# Patient Record
Sex: Female | Born: 1970 | Marital: Single | Smoking: Never smoker
Health system: Southern US, Community
[De-identification: ages and names within clinical notes are randomized; demographics above are authoritative.]

## PROBLEM LIST (undated history)

## (undated) ENCOUNTER — Inpatient Hospital Stay (HOSPITAL_COMMUNITY): Payer: 59

## (undated) DIAGNOSIS — F329 Major depressive disorder, single episode, unspecified: Secondary | ICD-10-CM

## (undated) DIAGNOSIS — IMO0002 Reserved for concepts with insufficient information to code with codable children: Secondary | ICD-10-CM

## (undated) DIAGNOSIS — E785 Hyperlipidemia, unspecified: Secondary | ICD-10-CM

## (undated) DIAGNOSIS — G4730 Sleep apnea, unspecified: Secondary | ICD-10-CM

## (undated) DIAGNOSIS — C679 Malignant neoplasm of bladder, unspecified: Secondary | ICD-10-CM

## (undated) DIAGNOSIS — B373 Candidiasis of vulva and vagina: Secondary | ICD-10-CM

## (undated) DIAGNOSIS — D649 Anemia, unspecified: Secondary | ICD-10-CM

## (undated) DIAGNOSIS — I499 Cardiac arrhythmia, unspecified: Secondary | ICD-10-CM

## (undated) DIAGNOSIS — Z8679 Personal history of other diseases of the circulatory system: Secondary | ICD-10-CM

## (undated) DIAGNOSIS — I341 Nonrheumatic mitral (valve) prolapse: Secondary | ICD-10-CM

## (undated) DIAGNOSIS — F32A Depression, unspecified: Secondary | ICD-10-CM

## (undated) DIAGNOSIS — N926 Irregular menstruation, unspecified: Secondary | ICD-10-CM

## (undated) DIAGNOSIS — C801 Malignant (primary) neoplasm, unspecified: Secondary | ICD-10-CM

## (undated) DIAGNOSIS — F419 Anxiety disorder, unspecified: Secondary | ICD-10-CM

## (undated) DIAGNOSIS — E611 Iron deficiency: Secondary | ICD-10-CM

## (undated) DIAGNOSIS — R002 Palpitations: Secondary | ICD-10-CM

## (undated) DIAGNOSIS — Z8619 Personal history of other infectious and parasitic diseases: Secondary | ICD-10-CM

## (undated) DIAGNOSIS — R011 Cardiac murmur, unspecified: Secondary | ICD-10-CM

## (undated) HISTORY — PX: LEEP: SHX91

## (undated) HISTORY — DX: Candidiasis of vulva and vagina: B37.3

## (undated) HISTORY — DX: Reserved for concepts with insufficient information to code with codable children: IMO0002

## (undated) HISTORY — DX: Sleep apnea, unspecified: G47.30

## (undated) HISTORY — DX: Personal history of other diseases of the circulatory system: Z86.79

## (undated) HISTORY — DX: Personal history of other infectious and parasitic diseases: Z86.19

## (undated) HISTORY — PX: CERVICAL BIOPSY  W/ LOOP ELECTRODE EXCISION: SUR135

## (undated) HISTORY — DX: Hyperlipidemia, unspecified: E78.5

## (undated) HISTORY — DX: Anxiety disorder, unspecified: F41.9

## (undated) HISTORY — DX: Cardiac murmur, unspecified: R01.1

## (undated) HISTORY — DX: Iron deficiency: E61.1

## (undated) HISTORY — DX: Anemia, unspecified: D64.9

## (undated) HISTORY — DX: Irregular menstruation, unspecified: N92.6

## (undated) HISTORY — DX: Depression, unspecified: F32.A

## (undated) HISTORY — DX: Major depressive disorder, single episode, unspecified: F32.9

## (undated) HISTORY — PX: COLPOSCOPY: SHX161

## (undated) HISTORY — PX: CYSTOSCOPY: SUR368

## (undated) HISTORY — DX: Cardiac arrhythmia, unspecified: I49.9

## (undated) HISTORY — DX: Palpitations: R00.2

---
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
## 1989-04-23 HISTORY — PX: WISDOM TOOTH EXTRACTION: SHX21

## 1994-09-24 DIAGNOSIS — R7611 Nonspecific reaction to tuberculin skin test without active tuberculosis: Secondary | ICD-10-CM | POA: Insufficient documentation

## 2005-03-12 ENCOUNTER — Other Ambulatory Visit: Admission: RE | Admit: 2005-03-12 | Discharge: 2005-03-12 | Payer: Self-pay | Admitting: Gynecology

## 2005-09-11 ENCOUNTER — Ambulatory Visit: Payer: Self-pay | Admitting: Sports Medicine

## 2005-10-16 ENCOUNTER — Ambulatory Visit: Payer: Self-pay | Admitting: Sports Medicine

## 2005-12-14 ENCOUNTER — Ambulatory Visit: Payer: Self-pay | Admitting: Sports Medicine

## 2006-04-23 DIAGNOSIS — C801 Malignant (primary) neoplasm, unspecified: Secondary | ICD-10-CM

## 2006-04-23 HISTORY — PX: BLADDER SURGERY: SHX569

## 2006-04-23 HISTORY — DX: Malignant (primary) neoplasm, unspecified: C80.1

## 2006-11-04 ENCOUNTER — Encounter: Admission: RE | Admit: 2006-11-04 | Discharge: 2006-11-04 | Payer: Self-pay | Admitting: Family Medicine

## 2007-02-04 ENCOUNTER — Ambulatory Visit: Payer: Self-pay | Admitting: Sports Medicine

## 2007-02-04 DIAGNOSIS — M7710 Lateral epicondylitis, unspecified elbow: Secondary | ICD-10-CM | POA: Insufficient documentation

## 2007-02-04 DIAGNOSIS — M76899 Other specified enthesopathies of unspecified lower limb, excluding foot: Secondary | ICD-10-CM | POA: Insufficient documentation

## 2007-06-25 ENCOUNTER — Encounter: Admission: RE | Admit: 2007-06-25 | Discharge: 2007-06-25 | Payer: Self-pay | Admitting: Family Medicine

## 2007-11-04 ENCOUNTER — Ambulatory Visit: Payer: Self-pay | Admitting: Sports Medicine

## 2007-11-04 DIAGNOSIS — M79609 Pain in unspecified limb: Secondary | ICD-10-CM | POA: Insufficient documentation

## 2007-11-04 DIAGNOSIS — M25539 Pain in unspecified wrist: Secondary | ICD-10-CM | POA: Insufficient documentation

## 2007-11-04 DIAGNOSIS — M25519 Pain in unspecified shoulder: Secondary | ICD-10-CM | POA: Insufficient documentation

## 2008-04-06 DIAGNOSIS — B373 Candidiasis of vulva and vagina: Secondary | ICD-10-CM

## 2008-04-06 HISTORY — DX: Candidiasis of vulva and vagina: B37.3

## 2008-07-16 ENCOUNTER — Encounter: Admission: RE | Admit: 2008-07-16 | Discharge: 2008-07-16 | Payer: Self-pay | Admitting: Family Medicine

## 2009-01-10 ENCOUNTER — Encounter: Payer: Self-pay | Admitting: Sports Medicine

## 2009-06-23 ENCOUNTER — Encounter: Payer: Self-pay | Admitting: Sports Medicine

## 2009-07-12 ENCOUNTER — Ambulatory Visit: Payer: Self-pay | Admitting: Sports Medicine

## 2009-07-14 DIAGNOSIS — IMO0002 Reserved for concepts with insufficient information to code with codable children: Secondary | ICD-10-CM

## 2009-07-14 HISTORY — DX: Reserved for concepts with insufficient information to code with codable children: IMO0002

## 2009-07-22 ENCOUNTER — Encounter: Admission: RE | Admit: 2009-07-22 | Discharge: 2009-07-22 | Payer: Self-pay | Admitting: Family Medicine

## 2009-07-28 ENCOUNTER — Encounter: Admission: RE | Admit: 2009-07-28 | Discharge: 2009-07-28 | Payer: Self-pay | Admitting: Family Medicine

## 2009-11-10 ENCOUNTER — Encounter: Payer: Self-pay | Admitting: Sports Medicine

## 2010-05-14 ENCOUNTER — Encounter: Payer: Self-pay | Admitting: Family Medicine

## 2010-05-23 NOTE — Miscellaneous (Signed)
Summary: [facility]   Imported By: Marily Memos 11/15/2009 10:03:20  _____________________________________________________________________  External Attachment:    Type:   Image     Comment:   External Document

## 2010-05-23 NOTE — Miscellaneous (Signed)
Summary: [facility]  [facility]   Imported By: Marily Memos 06/27/2009 10:09:30  _____________________________________________________________________  External Attachment:    Type:   Image     Comment:   External Document

## 2010-05-23 NOTE — Consult Note (Signed)
Summary: [facility]  [facility]   Imported By: Marily Memos 07/12/2009 11:07:32  _____________________________________________________________________  External Attachment:    Type:   Image     Comment:   External Document

## 2010-05-23 NOTE — Assessment & Plan Note (Signed)
Summary: WRIST F/U,[facility]   Vital Signs:  Patient profile:   40 year old female Height:      61 inches Weight:      155 pounds BMI:     29.39 BP sitting:   161 / 91  Vitals Entered By: Enid Baas MD (July 12, 2009 8:49 AM)  History of Present Illness: 2 months ago RT wrist pain had some pain on computer specifically after tennis had to stop with lat wrist pain hurt to move saw Dr Silas [place] day splinted and sent to therapy [facility] in [place] also given exercises for shoulder  now feels much better comes for eval and to see if OK to play tennis   Allergies: No Known Drug Allergies  Physical Exam  General:  Well-developed,well-nourished,in no acute distress; alert,appropriate and cooperative throughout examination Msk:  RT wrist shows full ROM testing in extension, flexdion wnl radial and ulnar deviation are pain free to resistance she does get some clicking with ulnar deviaiton grip OK  review of XRays sho neg ulnar variance otherwise WNL   Impression & Recommendations:  Problem # 1:  WRIST PAIN, RIGHT (ICD-719.43) I think this was probably a strain/ possible partial tear of  TFCC on RT  plan to ease back into tennis given specific rehab exercies and a lot of tennis specific exercises progress over 1 mo if OK can rtc as needed if not rechk  Patient Instructions: 1)  first 2 weeks use your wrist brace 2)  then can try without 3)  exercises with light weight - 1 lb 4)  wrist flexion 5)  wrist extension 6)  up and down motion 7)  wrist rolls - forearm roll 8)  do 15 reps of each of these 9)  then do 30 of each tennis stroke holding the 1 lb weight 10)  once daily do these 11)  consistent for first 6 weeks then you can go to 2 to 3 x per week

## 2010-05-23 NOTE — Miscellaneous (Signed)
Summary: [facility]   Imported By: Marily Memos 06/23/2009 11:07:10  _____________________________________________________________________  External Attachment:    Type:   Image     Comment:   External Document

## 2010-07-04 ENCOUNTER — Other Ambulatory Visit: Payer: Self-pay | Admitting: Obstetrics and Gynecology

## 2010-07-04 DIAGNOSIS — Z1231 Encounter for screening mammogram for malignant neoplasm of breast: Secondary | ICD-10-CM

## 2010-07-27 DIAGNOSIS — N926 Irregular menstruation, unspecified: Secondary | ICD-10-CM

## 2010-07-27 HISTORY — DX: Irregular menstruation, unspecified: N92.6

## 2010-07-28 ENCOUNTER — Ambulatory Visit
Admission: RE | Admit: 2010-07-28 | Discharge: 2010-07-28 | Disposition: A | Discharge status: Home / Self Care | Payer: 59 | Source: Ambulatory Visit | Attending: Obstetrics and Gynecology | Admitting: Obstetrics and Gynecology

## 2010-07-28 DIAGNOSIS — Z1231 Encounter for screening mammogram for malignant neoplasm of breast: Secondary | ICD-10-CM

## 2010-11-08 ENCOUNTER — Encounter (HOSPITAL_COMMUNITY): Payer: Self-pay | Admitting: *Deleted

## 2010-11-21 NOTE — H&P (Signed)
NAME:  Susan Cordova, Susan Cordova                      ACCOUNT NO.:  MEDICAL RECORD NO.:  LOCATION:                                 FACILITY:  PHYSICIAN:  Birdena Kingma A. Dodie Parisi, M.D.      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  HISTORY:  The patient is a 40 year old nulliparous female who presented to me for routine exam and repeat Pap smear.  In March 2011, the patient had Pap smear that was significant for ASCUS with high-risk HPV.  She had a couple of biopsy and a biopsy was negative.  She came in for repeat Pap smear in 6 months which was significant for low-grade squamous intraepithelial lesions, had a biopsy at 6 o'clock which was significant for high-grade intraepithelial lesion and moderate dysplasia, and also had endometrial biopsy for one episode of heavy bleeding which showed benign secretory endometrium without atypia, hyperplasia, or malignancy.  The patient was then counseled as far as observation, LEEP versus conization versus cryo.  The patient shown to proceed with a LEEP procedure.  PAST MEDICAL HISTORY:  Significant for bladder cancer.  The patient has no known drug allergies.  MEDICATIONS:  Include calcium and vitamin D.  FAMILY HISTORY:  Significant for mother with breast cancer at age 93.  PAST GYN HISTORY:  As above.  The patient had one episode where she bled through her clothes and changed her pad every 1-2 hours, this only had been with one cycle.  Her endometrial biopsy was found to be negative.  REVIEW OF SYSTEMS:  GENITOURINARY:  As above.  CARDIOVASCULAR: Significant for heart murmur.  EXTREMITIES:  No muscle weakness.  PHYSICAL EXAMINATION:  VITAL SIGNS:  Blood pressure is 100/60.  She weighs 162 pounds.  She is 5 feet 2 inches, BMI of 29. HEENT:  Her pupils are equal.  Her hearing is normal.   Her throat is clear.  Her thyroid is not enlarged. HEART:  Regular rate and rhythm. CHEST:  Clear to auscultation bilaterally. SKIN AND BREASTS:  No masses, discharge, skin change or nipple retraction. BACK:  Has no CVA tenderness. ABDOMEN:  Nontender without any mass or organomegaly. MUSCULOSKELETAL:  No cyanosis, clubbing or edema. NEUROLOGIC:  Within normal limits. PELVIC:  Full vaginal exam within normal limits.  Cervix is nontender without any lesions.  Uterus, felt slightly irregular, but normal shape, size and nontender.  Adnexa was nontender.  ASSESSMENT:  High-grade intraepithelial lesion and moderate dysplasia.  PLAN:  LEEP.  The patient understands the risks to be but not limited to bleeding, infection, damage to the cervix which could lead to complications in pregnancy with cervical incompetence or stenosis, should she pursue that.  The patient also had ultrasound.  Her uterus and ovaries were normal.  The plan is to proceed with a LEEP.     Susan Cordova A. Normand Sloop, M.D.     NAD/MEDQ  D:  11/21/2010  T:  11/21/2010  Job:  [id number]

## 2010-11-24 ENCOUNTER — Other Ambulatory Visit: Payer: Self-pay | Admitting: Obstetrics and Gynecology

## 2010-11-24 ENCOUNTER — Encounter (HOSPITAL_COMMUNITY)
Admission: RE | Disposition: A | Discharge status: Home / Self Care | Payer: Self-pay | Source: Ambulatory Visit | Attending: Obstetrics and Gynecology

## 2010-11-24 ENCOUNTER — Ambulatory Visit (HOSPITAL_COMMUNITY)
Admission: RE | Admit: 2010-11-24 | Discharge: 2010-11-24 | Disposition: A | Discharge status: Home / Self Care | Payer: 59 | Source: Ambulatory Visit | Attending: Obstetrics and Gynecology | Admitting: Obstetrics and Gynecology

## 2010-11-24 ENCOUNTER — Encounter (HOSPITAL_COMMUNITY): Payer: Self-pay | Admitting: Anesthesiology

## 2010-11-24 ENCOUNTER — Ambulatory Visit (HOSPITAL_COMMUNITY): Payer: 59 | Admitting: Anesthesiology

## 2010-11-24 DIAGNOSIS — N871 Moderate cervical dysplasia: Secondary | ICD-10-CM | POA: Insufficient documentation

## 2010-11-24 HISTORY — PX: LEEP: SHX91

## 2010-11-24 HISTORY — DX: Malignant (primary) neoplasm, unspecified: C80.1

## 2010-11-24 LAB — CBC
HCT: 38.5 % (ref 36.0–46.0)
MCH: 29 pg (ref 26.0–34.0)
MCHC: 33.2 g/dL (ref 30.0–36.0)
Platelets: 184 10*3/uL (ref 150–400)
RBC: 4.41 MIL/uL (ref 3.87–5.11)
RDW: 13.5 % (ref 11.5–15.5)
WBC: 7.3 10*3/uL (ref 4.0–10.5)

## 2010-11-24 LAB — PREGNANCY, URINE: Preg Test, Ur: NEGATIVE

## 2010-11-24 SURGERY — LEEP (LOOP ELECTROSURGICAL EXCISION PROCEDURE)
Anesthesia: Monitor Anesthesia Care | Wound class: Clean Contaminated

## 2010-11-24 MED ORDER — SODIUM CHLORIDE 0.9 % IV SOLN
2.0000 g | Freq: Once | INTRAVENOUS | Status: AC
  Administered 2010-11-24: 2 g via INTRAVENOUS
  Filled 2010-11-24: qty 2000

## 2010-11-24 MED ORDER — HYDROCODONE-ACETAMINOPHEN 5-325 MG PO TABS
ORAL_TABLET | ORAL | Status: AC
  Administered 2010-11-24: 1
  Filled 2010-11-24: qty 1

## 2010-11-24 MED ORDER — ACETAMINOPHEN 325 MG PO TABS: 325.0000 mg | ORAL_TABLET | ORAL | Status: DC | PRN

## 2010-11-24 MED ORDER — MIDAZOLAM HCL 5 MG/5ML IJ SOLN
INTRAMUSCULAR | Status: DC | PRN
  Administered 2010-11-24: 2 mg via INTRAVENOUS

## 2010-11-24 MED ORDER — PROPOFOL 10 MG/ML IV EMUL
INTRAVENOUS | Status: AC
  Filled 2010-11-24: qty 20

## 2010-11-24 MED ORDER — LIDOCAINE HCL (CARDIAC) 20 MG/ML IV SOLN
INTRAVENOUS | Status: DC | PRN
  Administered 2010-11-24 (×2): 30 mg via INTRAVENOUS

## 2010-11-24 MED ORDER — FENTANYL CITRATE 0.05 MG/ML IJ SOLN
INTRAMUSCULAR | Status: DC | PRN
  Administered 2010-11-24: 50 ug via INTRAVENOUS

## 2010-11-24 MED ORDER — LIDOCAINE HCL (CARDIAC) 20 MG/ML IV SOLN
INTRAVENOUS | Status: AC
  Filled 2010-11-24: qty 5

## 2010-11-24 MED ORDER — HYDROCODONE-ACETAMINOPHEN 5-500 MG PO TABS: 1.0000 | ORAL_TABLET | Freq: Four times a day (QID) | ORAL | Status: AC | PRN

## 2010-11-24 MED ORDER — FAMOTIDINE 20 MG PO TABS: 20.0000 mg | ORAL_TABLET | Freq: Once | ORAL | Status: DC | PRN

## 2010-11-24 MED ORDER — ONDANSETRON HCL 4 MG/2ML IJ SOLN
INTRAMUSCULAR | Status: AC
  Filled 2010-11-24: qty 2

## 2010-11-24 MED ORDER — PROPOFOL 10 MG/ML IV EMUL
INTRAVENOUS | Status: DC | PRN
  Administered 2010-11-24: 20 mg via INTRAVENOUS
  Administered 2010-11-24 (×5): 40 mg via INTRAVENOUS
  Administered 2010-11-24: 20 mg via INTRAVENOUS
  Administered 2010-11-24: 10 mg via INTRAVENOUS

## 2010-11-24 MED ORDER — LIDOCAINE HCL 2 % IJ SOLN
INTRAMUSCULAR | Status: DC | PRN
  Administered 2010-11-24: 20 mL via INTRADERMAL

## 2010-11-24 MED ORDER — KETOROLAC TROMETHAMINE 30 MG/ML IJ SOLN
INTRAMUSCULAR | Status: AC
  Filled 2010-11-24: qty 1

## 2010-11-24 MED ORDER — CITRIC ACID-SODIUM CITRATE 334-500 MG/5ML PO SOLN: 30.0000 mL | Freq: Once | ORAL | Status: DC | PRN

## 2010-11-24 MED ORDER — FENTANYL CITRATE 0.05 MG/ML IJ SOLN: 25.0000 ug | INTRAMUSCULAR | Status: DC | PRN

## 2010-11-24 MED ORDER — IBUPROFEN 200 MG PO TABS: 200.0000 mg | ORAL_TABLET | Freq: Four times a day (QID) | ORAL | Status: DC | PRN

## 2010-11-24 MED ORDER — FERRIC SUBSULFATE SOLN
Status: DC | PRN
  Administered 2010-11-24: 1

## 2010-11-24 MED ORDER — LACTATED RINGERS IV SOLN
INTRAVENOUS | Status: DC
  Administered 2010-11-24: 08:00:00 via INTRAVENOUS

## 2010-11-24 MED ORDER — LUGOLS 5 % PO SOLN
ORAL | Status: DC | PRN
  Administered 2010-11-24: 0.1 mL

## 2010-11-24 MED ORDER — FENTANYL CITRATE 0.05 MG/ML IJ SOLN
INTRAMUSCULAR | Status: AC
  Filled 2010-11-24: qty 2

## 2010-11-24 MED ORDER — LACTATED RINGERS IV SOLN
INTRAVENOUS | Status: DC | PRN
  Administered 2010-11-24 (×2): via INTRAVENOUS

## 2010-11-24 MED ORDER — PROMETHAZINE HCL 25 MG/ML IJ SOLN: 6.2500 mg | INTRAMUSCULAR | Status: DC | PRN

## 2010-11-24 MED ORDER — KETOROLAC TROMETHAMINE 30 MG/ML IJ SOLN
INTRAMUSCULAR | Status: DC | PRN
  Administered 2010-11-24: 30 mg via INTRAVENOUS

## 2010-11-24 MED ORDER — METOCLOPRAMIDE HCL 10 MG PO TABS: 10.0000 mg | ORAL_TABLET | Freq: Once | ORAL | Status: DC

## 2010-11-24 MED ORDER — MIDAZOLAM HCL 2 MG/2ML IJ SOLN
INTRAMUSCULAR | Status: AC
  Filled 2010-11-24: qty 2

## 2010-11-24 MED ORDER — SCOPOLAMINE 1 MG/3DAYS TD PT72: 1.0000 | MEDICATED_PATCH | Freq: Once | TRANSDERMAL | Status: DC

## 2010-11-24 MED ORDER — PANTOPRAZOLE SODIUM 40 MG PO TBEC: 40.0000 mg | DELAYED_RELEASE_TABLET | Freq: Once | ORAL | Status: DC | PRN

## 2010-11-24 SURGICAL SUPPLY — 37 items
APPLICATOR COTTON TIP 6IN STRL (MISCELLANEOUS) ×2 IMPLANT
CLOTH BEACON ORANGE TIMEOUT ST (SAFETY) ×2 IMPLANT
CONT SPECI 4OZ STER CLIK (MISCELLANEOUS) ×2 IMPLANT
COUNTER NEEDLE 1200 MAGNETIC (NEEDLE) ×1 IMPLANT
ELECT BALL LEEP 5MM RED (ELECTRODE) ×2 IMPLANT
ELECT LOOP 20X12 DISP (CUTTING LOOP)
ELECT LOOP LEEP RND 15X12 GRN (CUTTING LOOP) ×2
ELECT REM PT RETURN 9FT ADLT (ELECTROSURGICAL) ×2
ELECTRODE LOOP 20X12 DISP (CUTTING LOOP) IMPLANT
ELECTRODE LOOP LP RND 15X12GRN (CUTTING LOOP) IMPLANT
ELECTRODE REM PT RTRN 9FT ADLT (ELECTROSURGICAL) ×1 IMPLANT
EVACUATOR PREFILTER SMOKE (MISCELLANEOUS) ×1 IMPLANT
GAUZE SPONGE 4X4 16PLY XRAY LF (GAUZE/BANDAGES/DRESSINGS) IMPLANT
GLOVE BIO SURGEON STRL SZ 6.5 (GLOVE) ×2 IMPLANT
GLOVE BIOGEL PI IND STRL 7.0 (GLOVE) ×1 IMPLANT
GLOVE BIOGEL PI INDICATOR 7.0 (GLOVE) ×1
GOWN PREVENTION PLUS LG XLONG (DISPOSABLE) ×4 IMPLANT
HOSE NS SMOKE EVAC 7/8 X6 (MISCELLANEOUS) ×2 IMPLANT
NDL SPNL 22GX3.5 QUINCKE BK (NEEDLE) ×1 IMPLANT
NEEDLE SPNL 22GX3.5 QUINCKE BK (NEEDLE) ×2 IMPLANT
NS IRRIG 1000ML POUR BTL (IV SOLUTION) ×2 IMPLANT
PACK VAGINAL MINOR WOMEN LF (CUSTOM PROCEDURE TRAY) ×2 IMPLANT
PENCIL BUTTON HOLSTER BLD 10FT (ELECTRODE) ×2 IMPLANT
REDUCER FITTING SMOKE EVAC (MISCELLANEOUS) ×2 IMPLANT
SCOPETTES 8  STERILE (MISCELLANEOUS) ×3
SCOPETTES 8 STERILE (MISCELLANEOUS) ×3 IMPLANT
SET IRRIG TUBING LAPAROSCOPIC (IRRIGATION / IRRIGATOR) ×2 IMPLANT
SUT VIC AB 0 CT1 27 (SUTURE)
SUT VIC AB 0 CT1 27XBRD ANBCTR (SUTURE) IMPLANT
SUT VIC AB 2-0 SH 27 (SUTURE) ×2
SUT VIC AB 2-0 SH 27XBRD (SUTURE) IMPLANT
SYR 20CC LL (SYRINGE) ×2 IMPLANT
TOWEL OR 17X24 6PK STRL BLUE (TOWEL DISPOSABLE) ×4 IMPLANT
TUBING CONNECTING 10 (TUBING) ×1 IMPLANT
TUBING SMOKE EVAC HOSE ADAPTER (MISCELLANEOUS) ×2 IMPLANT
WATER STERILE IRR 1000ML POUR (IV SOLUTION) ×2 IMPLANT
YANKAUER SUCT BULB TIP NO VENT (SUCTIONS) ×1 IMPLANT

## 2010-11-24 NOTE — Op Note (Signed)
Preop diagnosis high grade squamous epithelial lesion  Postop diagnosis is the same Procedure is a LEEP Surgeon is Dr. Normand Sloop Anesthesia MAC and 20 cc 2% Xylocaine used for cervical block Complications are none The blood loss is minimal LEEP cervical biopsy sent to pathology for evaluation Procedure in detail. The patient went to the operating room. She is placed in dorsal lithotomy position. She was prepped and draped in normal sterile fashion. An in and out catheter was used to drain the bladder. A coated bivalve speculum was placed into the vagina. Using the medium-sized loop the LEEP was done without difficulty. The cervical bed was made hemostatic with Bovie cautery. Micelles also used to help stop the bleeding. Before the procedure started, the patient was was given a cervical block using 20 cc of 2% Xylocaine. A tenaculum was removed from the cervix with good hemostasis noted. Counts were correct. Patiently T. the PACU in stable condition

## 2010-11-24 NOTE — Transfer of Care (Signed)
Immediate Anesthesia Transfer of Care Note  Patient: Susan Cordova  Procedure(s) Performed:  LOOP ELECTROSURGICAL EXCISION PROCEDURE (LEEP)  Patient Location: PACU  Anesthesia Type: MAC  Level of Consciousness: awake, alert , oriented, patient cooperative and responds to stimulation  Airway & Oxygen Therapy: Patient Spontanous Breathing  Post-op Assessment: Report given to PACU RN  Post vital signs: Reviewed and stable  Complications: No apparent anesthesia complications

## 2010-11-24 NOTE — Anesthesia Postprocedure Evaluation (Signed)
  Anesthesia Post Note  Patient: Susan Cordova  Procedure(s) Performed:  LOOP ELECTROSURGICAL EXCISION PROCEDURE (LEEP)  Anesthesia type: MAC  Patient location: PACU  Post pain: Pain level controlled  Post assessment: Post-op Vital signs reviewed  Last Vitals:  Filed Vitals:   11/24/10 1000  BP: 140/74  Pulse: 63  Temp: 98.6 F (37 C)  Resp: 18    Post vital signs: Reviewed  Level of consciousness: sedated  Complications: No apparent anesthesia complications

## 2010-11-24 NOTE — Anesthesia Preprocedure Evaluation (Signed)
Anesthesia Evaluation  Name, MR# and DOB Patient awake  General Assessment Comment  Reviewed: Allergy & Precautions, H&P , Patient's Chart, lab work & pertinent test results and reviewed documented beta blocker date and time   History of Anesthesia Complications Negative for: history of anesthetic complications  Airway Mallampati: II TM Distance: >3 FB Neck ROM: full    Dental No notable dental hx    Pulmonaryneg pulmonary ROS    clear to auscultation  pulmonary exam normal   Cardiovascular Exercise Tolerance: Good regular Normal   Neuro/PsychNegative Neurological ROS Negative Psych ROS  GI/Hepatic/Renal negative GI ROS, negative Liver ROS, and negative Renal ROS (+)       Endo/Other  Negative Endocrine ROS (+)   Abdominal   Musculoskeletal  Hematology negative hematology ROS (+)   Peds  Reproductive/Obstetrics negative OB ROS   Anesthesia Other Findings             Anesthesia Physical Anesthesia Plan  ASA: I  Anesthesia Plan: MAC   Post-op Pain Management:    Induction:   Airway Management Planned:   Additional Equipment:   Intra-op Plan:   Post-operative Plan:   Informed Consent: I have reviewed the patients History and Physical, chart, labs and discussed the procedure including the risks, benefits and alternatives for the proposed anesthesia with the patient or authorized representative who has indicated his/her understanding and acceptance.   Dental Advisory Given  Plan Discussed with: CRNA and Surgeon  Anesthesia Plan Comments:         Anesthesia Quick Evaluation  

## 2010-11-24 NOTE — Anesthesia Postprocedure Evaluation (Signed)
Anesthesia Post Note  Patient: Susan Cordova  Procedure(s) Performed:  LOOP ELECTROSURGICAL EXCISION PROCEDURE (LEEP)  Anesthesia type: MAC  Patient location: PACU  Post pain: Pain level controlled  Post assessment: Post-op Vital signs reviewed  Last Vitals:  Filed Vitals:   11/24/10 0945  BP:   Pulse:   Temp:   Resp: 18    Post vital signs: Reviewed  Level of consciousness: sedated  Complications: No apparent anesthesia complications

## 2010-12-12 ENCOUNTER — Inpatient Hospital Stay (HOSPITAL_COMMUNITY)
Admission: AD | Admit: 2010-12-12 | Discharge: 2010-12-12 | Disposition: A | Discharge status: Home / Self Care | Payer: 59 | Source: Ambulatory Visit | Attending: Obstetrics and Gynecology | Admitting: Obstetrics and Gynecology

## 2010-12-12 ENCOUNTER — Encounter (HOSPITAL_COMMUNITY): Payer: Self-pay | Admitting: *Deleted

## 2010-12-12 DIAGNOSIS — N949 Unspecified condition associated with female genital organs and menstrual cycle: Secondary | ICD-10-CM | POA: Insufficient documentation

## 2010-12-12 DIAGNOSIS — N939 Abnormal uterine and vaginal bleeding, unspecified: Secondary | ICD-10-CM

## 2010-12-12 DIAGNOSIS — N938 Other specified abnormal uterine and vaginal bleeding: Secondary | ICD-10-CM | POA: Insufficient documentation

## 2010-12-12 HISTORY — DX: Nonrheumatic mitral (valve) prolapse: I34.1

## 2010-12-12 HISTORY — DX: Cardiac arrhythmia, unspecified: I49.9

## 2010-12-12 HISTORY — DX: Malignant neoplasm of bladder, unspecified: C67.9

## 2010-12-12 LAB — CBC
Hemoglobin: 12 g/dL (ref 12.0–15.0)
MCH: 29.1 pg (ref 26.0–34.0)
MCV: 86.4 fL (ref 78.0–100.0)
RBC: 4.13 MIL/uL (ref 3.87–5.11)

## 2010-12-12 NOTE — Progress Notes (Signed)
Pt presents to [facility] for c/o bleeding that became very heavy today at 3pm.  Had cycle that started on the 12th and ended on the 17th and then had some irregular bleeding after.  Denies pain.

## 2010-12-12 NOTE — Progress Notes (Signed)
SSE done per Eustace Pen,  CNM.  Monsel's soln applied.

## 2010-12-12 NOTE — Progress Notes (Signed)
Pt reports she had a LEEP on 08/03, seen for follow up last week. Started her period on 08/12 , tapered off on 08/17, started bleeding again the next day and today at about 3pm started bleeding heavy, changing a pad/tampon q 1 hour. Passing clots. Denies pain.

## 2010-12-12 NOTE — Progress Notes (Signed)
H. Steelman, CNM at bedside.  Assessment done and poc discussed with pt.  

## 2010-12-12 NOTE — ED Provider Notes (Signed)
History   Susan Cordova is a 39y.o. WF, nulligravida, who presents unannounced with cc of heavy vaginal bleeding since 1500 this pm.  Soaking through tampon and pad in hr or less.   Saturated clothes while at work and had to leave.  Denies pain.  Hx r/f LEEP on 11/24/10 by Dr. Normand Sloop, and pt had post-op follow-up Friday 12/08/10.  Her 1st menses started 12/03/10 s/p procedure, and tapered off by Friday's appointment, and Dr. Normand Sloop to ok patient to use tampons hence forward.  Pt had intermittent VB Sat & Sun, and had started using tampons, and then bleeding acutely increased this afternoon.  Pt denies dizziness, syncope.  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  No other c/o's.    Chief Complaint  Patient presents with  . Vaginal Bleeding   Vaginal Bleeding The patient's primary symptoms include vaginal bleeding. The problem has been rapidly worsening. The patient is experiencing no pain. She is not pregnant (declines upt; no ic since neg on day of procedure). The vaginal bleeding is heavier than menses. She has been passing clots (some "quarter to half-dollar" size). She has not been passing tissue. She uses nothing for contraception.      Past Medical History  Diagnosis Date  . Cancer 2008    Bladder  . Dysrhythmia   . Mitral valve prolapse   . Bladder cancer     Past Surgical History  Procedure Date  . Bladder surgery 2008    cancer - no chemo, no urostomy  . Leep   . Cystoscopy     No family history on file.  History  Substance Use Topics  . Smoking status: Never Smoker   . Smokeless tobacco: Not on file  . Alcohol Use: No     rarely    Allergies: No Known Allergies  Prescriptions prior to admission  Medication Sig Dispense Refill  . fish oil-omega-3 fatty acids 1000 MG capsule Take 1 g by mouth 2 (two) times daily. [employer] brand fish oil.       Marland Kitchen ibuprofen (ADVIL) 200 MG tablet Take 1 tablet (200 mg total) by mouth  every 6 (six) hours as needed for pain.  30 tablet  0    Review of Systems  Constitutional: Negative.   Respiratory: Negative.   Cardiovascular: Negative.   Gastrointestinal: Negative.   Genitourinary: Negative.    Physical Exam   Blood pressure 146/86, pulse 65, temperature 98.8 F (37.1 C), temperature source Oral, resp. rate 18, height 5\' 1"  (1.549 m), weight 72.576 kg (160 lb), last menstrual period 12/03/2010.  Physical Exam  Constitutional: She is oriented to person, place, and time. She appears well-developed and well-nourished.  Cardiovascular: Normal rate and regular rhythm.   Respiratory: Effort normal and breath sounds normal.  GI: Soft. Bowel sounds are normal.  Genitourinary: Vagina normal.       SSE:  lg amt of BRB in vault on initial inspection, once cleared and cx well visualized, several small actively bleeding area dispersed across healing areas s/p LEEP; Monsel solution heavily applied and no further active bleeding noted.    Approximately 30 minutes after applied, 0.5cm superficial smear of blood noted on pad; no active bleeding  Neurological: She is alert and oriented to person, place, and time. She has normal reflexes.  Skin: Skin is warm and dry.  Psychiatric: She has a normal mood and affect. Her behavior is normal. Judgment and thought content normal.  LABS:  CBC wnl;  Hgb=12 (12.8 on 11/24/10) MAU Course  Procedures 1.  SSE 2.  Monsel solution to cx 3.  CBC 4.  Po hydration  Assessment and Plan  1.  Heavy vaginal bleeding s/p LEEP 11/24/10--probably aggravated by recent menses over last wk & use of tampons last 3 days 2.  CBC stable in comparison to one drawn day of procedure 3.  Bleeding improved with Monsel solution 4.  Vegetarian  1.  Per c/w dr. Stefano Gaul, pt d/c'd home after bleeding improved and pt stable; bleeding precautions rev'd;  2.  F/u as needed for any concerns or questions, and as previously scheduled by dr. Normand Sloop at [facility] 3.   Discussed iron-rich diet, increase po fluids, slow position changes, etc.  Jewelz Ricklefs H 12/12/2010, 10:31 PM

## 2011-01-12 ENCOUNTER — Ambulatory Visit (INDEPENDENT_AMBULATORY_CARE_PROVIDER_SITE_OTHER): Payer: 59 | Admitting: Family Medicine

## 2011-01-12 ENCOUNTER — Encounter: Payer: Self-pay | Admitting: Family Medicine

## 2011-01-12 DIAGNOSIS — R109 Unspecified abdominal pain: Secondary | ICD-10-CM | POA: Insufficient documentation

## 2011-01-12 NOTE — Progress Notes (Signed)
  Subjective:    Patient ID: Susan Cordova, female    DOB: Sep 12, 1970, 40 y.o.   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  She noticed the pain 3-4 minutes into her run, thought it was a cramp, attempted to keep going but had to stop when the pain worsened.  She rested for 2 weeks during which time the pain improved significantly but did not resolve completely.  Then she played a tournament (6 games of doubles tennis) and the pain returned.  The pain is worse with getting up from a seated position, walking up stairs, and twisting of the trunk.  She doesn't have pain while sleeping unless she turns over.  She has no previous injuries in this area.  She has no buttock or leg pain.   Review of Systems Pertinent review of systems: negative for fever or unusual weight change. Denies incontinence     Objective:   Physical Exam GENERAL: Well developed, well nourished, no acute distress BJY:NWGNF FABER non-painful Flexes to 90 degrees without pain Internal rotation/ ER full and  non-painful Difficult to reproduce the tenderness on palpation but the area seems to be around the muscle insertions into posterior / lateral iliac crest SLR neg B Full strength hip flexors B  Back: Flexes, extends, and laterally rotates (upright) without pain Pain is reproduced with flexion at hips  to 90 degrees and rotation away from injured side.  GAIT running; short stride length Mild out toeing. Right arm carry is stiff and held close to the body while the left is crossing midline with every stride and held horizontal to the ground      Assessment & Plan:  #1 muscle strain: Likely insertion of the internal and external obliques on the right.   Suspect this was from attempting to do some longer distance running with poor core strength. Her running gait causes a lot of rotation in the upper body. She also has some  orthotics that indicate she may have a leg length discrepancy in the past. Her orthotics are quite worn. Gave her core strengthening program. I would not do any long distance running until she completes 2- 3 weeks of that. I would consider getting new orthotics before returning to running. No other restrictions.

## 2011-01-18 ENCOUNTER — Encounter (HOSPITAL_COMMUNITY): Payer: Self-pay | Admitting: Obstetrics and Gynecology

## 2011-02-27 ENCOUNTER — Ambulatory Visit (INDEPENDENT_AMBULATORY_CARE_PROVIDER_SITE_OTHER): Payer: 59 | Admitting: Sports Medicine

## 2011-02-27 VITALS — BP 140/80

## 2011-02-27 DIAGNOSIS — R269 Unspecified abnormalities of gait and mobility: Secondary | ICD-10-CM

## 2011-02-27 DIAGNOSIS — M21611 Bunion of right foot: Secondary | ICD-10-CM | POA: Insufficient documentation

## 2011-02-27 DIAGNOSIS — M21619 Bunion of unspecified foot: Secondary | ICD-10-CM

## 2011-02-27 DIAGNOSIS — M79609 Pain in unspecified limb: Secondary | ICD-10-CM

## 2011-02-27 NOTE — Assessment & Plan Note (Signed)
First ray posts added to orthotics Ck if this relieves pain

## 2011-02-27 NOTE — Assessment & Plan Note (Signed)
Patient was fitted for a standard, cushioned, semi-rigid orthotic.  The orthotic was heated and the patient stood on the orthotic blank positioned on the orthotic stand. The patient was positioned in subtalar neutral position and 10 degrees of ankle dorsiflexion in a weight bearing stance. After molding, a stable [employer]-Tech EVA base was applied to the orthotic blank.   The blank was ground to a stable position for weight bearing. Size: 7 red cambray base: Blue EVA Posting: first ray bilat additional orthotic padding: none  Nice gait correction

## 2011-02-27 NOTE — Assessment & Plan Note (Signed)
Some irncease in pain since older orthotics wearing down  Add support

## 2011-02-27 NOTE — Progress Notes (Signed)
  Subjective:    Patient ID: Susan Cordova, female    DOB: 1971/03/15, 40 y.o.   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 - At that time it was felt that her gait was a contributing factor - Still with occasional R abdominal hip flank pain - Active tennis player and recently has had to limit her level of activity - The pain is worse with getting up from a seated position, walking up stairs, and twisting of the trunk.  - She has no previous injuries in this area. - She has no buttock or leg pain. - Has been doing core-strengthening but no abduction exercises  Note she has been in orthotics for 3 yrs These initially got rid of her foot and lower leg pain Orthotics now breaking down    Review of Systems Per HPI     Objective:   Physical Exam  GEN: NAD  MSK:  Inspection: - Out-toeing of both feet noted as well as bilateral bunions that are early. - Subluxation of metatarsal heads with 1st ray predominance 2nd to 4th down on RT and 2nd primarily on left - Left sided calcaneal valgus  - No leg length discrepancy  Gait: - Out-toeing of both feet while walking - Re-demonstration of calcaneal valgus and some dynamic genu valgus  Corrected Gait in Orthotics: - Re-demonstration of out-toeing but now with good foot control and proper anatomic alignment.         Assessment & Plan:   *ORTHOTICS/GAIT ABNORMALITY - Orthotics fitted today - first ray posts added - Gait now with corrected foot posture - RTC prn

## 2011-06-26 ENCOUNTER — Other Ambulatory Visit: Payer: Self-pay | Admitting: Family Medicine

## 2011-06-26 DIAGNOSIS — Z1231 Encounter for screening mammogram for malignant neoplasm of breast: Secondary | ICD-10-CM

## 2011-08-03 ENCOUNTER — Ambulatory Visit
Admission: RE | Admit: 2011-08-03 | Discharge: 2011-08-03 | Disposition: A | Discharge status: Home / Self Care | Payer: 59 | Source: Ambulatory Visit | Attending: Family Medicine | Admitting: Family Medicine

## 2011-08-03 DIAGNOSIS — Z1231 Encounter for screening mammogram for malignant neoplasm of breast: Secondary | ICD-10-CM

## 2011-10-04 ENCOUNTER — Encounter: Payer: Self-pay | Admitting: Obstetrics and Gynecology

## 2011-10-05 ENCOUNTER — Encounter: Payer: Self-pay | Admitting: Obstetrics and Gynecology

## 2011-10-05 ENCOUNTER — Ambulatory Visit (INDEPENDENT_AMBULATORY_CARE_PROVIDER_SITE_OTHER): Payer: 59 | Admitting: Obstetrics and Gynecology

## 2011-10-05 VITALS — BP 120/80 | Ht 61.0 in | Wt 167.0 lb

## 2011-10-05 DIAGNOSIS — Z124 Encounter for screening for malignant neoplasm of cervix: Secondary | ICD-10-CM

## 2011-10-05 DIAGNOSIS — N870 Mild cervical dysplasia: Secondary | ICD-10-CM

## 2011-10-05 NOTE — Progress Notes (Signed)
Last Pap: 12/12 WNL: ASCUS Regular Periods:yes Contraception: none  Monthly Breast exam:no Tetanus<33yrs:yes Nl.Bladder Function:yes Daily BMs:yes Healthy Diet:yes Calcium:no Mammogram:yes Exercise:yes Seatbelt: yes Abuse at home: no Stressful work:no Sigmoid-colonoscopy: n/a Bone Density: No BP 120/80  Ht 5\' 1"  (1.549 m)  Wt 167 lb (75.751 kg)  BMI 31.55 kg/m2  LMP 09/11/2011 Pt without complaints Physical Examination: General appearance - alert, well appearing, and in no distress Mental status - normal mood, behavior, speech, dress, motor activity, and thought processes Neck - supple, no significant adenopathy, thyroid exam: thyroid is normal in size without nodules or tenderness Chest - clear to auscultation, no wheezes, rales or rhonchi, symmetric air entry Heart - normal rate and regular rhythm Abdomen - soft, nontender, nondistended, no masses or organomegaly Breasts - breasts appear normal, no suspicious masses, no skin or nipple changes or axillary nodes Pelvic - normal external genitalia, vulva, vagina, cervix, uterus and adnexa Rectal - normal rectal, no masses, rectal exam not indicated Back exam - full range of motion, no tenderness, palpable spasm or pain on motion Neurological - alert, oriented, normal speech, no focal findings or movement disorder noted Musculoskeletal - no joint tenderness, deformity or swelling Extremities - no edema, redness or tenderness in the calves or thighs Skin - normal coloration and turgor, no rashes, no suspicious skin lesions noted Routine exam Pap sent yes Mammogram due no done last month declined BC RT 1 yr if pap WNL

## 2011-10-09 LAB — PAP IG W/ RFLX HPV ASCU

## 2012-07-17 ENCOUNTER — Other Ambulatory Visit: Payer: Self-pay

## 2012-07-17 DIAGNOSIS — Z1231 Encounter for screening mammogram for malignant neoplasm of breast: Secondary | ICD-10-CM

## 2012-08-05 ENCOUNTER — Ambulatory Visit (INDEPENDENT_AMBULATORY_CARE_PROVIDER_SITE_OTHER): Payer: 59 | Admitting: Sports Medicine

## 2012-08-05 VITALS — BP 162/90 | Ht 61.0 in | Wt 155.0 lb

## 2012-08-05 DIAGNOSIS — M7652 Patellar tendinitis, left knee: Secondary | ICD-10-CM

## 2012-08-05 DIAGNOSIS — M7650 Patellar tendinitis, unspecified knee: Secondary | ICD-10-CM | POA: Insufficient documentation

## 2012-08-05 NOTE — Progress Notes (Signed)
Patient ID: Rona Tomson, female   DOB: Aug 29, 1970, 42 y.o.   MRN: [id number]  Has had some anterior knee pain for 5 to 6 mos Been off running for 1 to 2 yrs and started back easy But after running 2 mos knee pain started Gradually got to where it would not stop with running  Now with up and down stairs Tennis with knee bent position  Feels unsteady at times No swelling No locking    Physical exam:  NAD  L knee No effusion Slight weakness of VMO and crepitation  Full flexion and extension, creptation with extension Mcmurray's negative  Pain with knee flexion at insertion of patellar tendon to tibial tubercle Ligaments stable Thessaly normal  Hip exam: Abduction strong bilat  [place] No effusion Meniscus is normal medial and lateral QT norm PT is normal but at distal end is hypoechoic change on surrounding tendon and fluid in infrapatellar bursa

## 2012-08-05 NOTE — Assessment & Plan Note (Signed)
Given rehab exercise protocol  Icing  Patellar strap  Aleve  Note she has had skin rxn to NTG in past  Reck 6 wks unless better

## 2012-08-05 NOTE — Patient Instructions (Addendum)
Try bodyhelix knee strap for activity  Straight leg raises with toes pointed outward Knee extension holding ball in between knees Half squats with heels elevated  Please follow up in 6 weeks unless you have improved.  Thank you for seeing [place] today!

## 2012-08-20 ENCOUNTER — Ambulatory Visit
Admission: RE | Admit: 2012-08-20 | Discharge: 2012-08-20 | Disposition: A | Discharge status: Home / Self Care | Payer: 59 | Source: Ambulatory Visit

## 2012-08-20 ENCOUNTER — Ambulatory Visit: Payer: 59 | Admitting: Sports Medicine

## 2012-08-20 DIAGNOSIS — Z1231 Encounter for screening mammogram for malignant neoplasm of breast: Secondary | ICD-10-CM

## 2012-08-29 ENCOUNTER — Ambulatory Visit (INDEPENDENT_AMBULATORY_CARE_PROVIDER_SITE_OTHER): Payer: 59 | Admitting: Internal Medicine

## 2012-08-29 DIAGNOSIS — Z23 Encounter for immunization: Secondary | ICD-10-CM

## 2012-08-29 DIAGNOSIS — Z789 Other specified health status: Secondary | ICD-10-CM

## 2012-08-29 MED ORDER — ATOVAQUONE-PROGUANIL HCL 250-100 MG PO TABS: 1.0000 | ORAL_TABLET | Freq: Every day | ORAL | Status: DC

## 2012-08-29 MED ORDER — AZITHROMYCIN 500 MG PO TABS: 500.0000 mg | ORAL_TABLET | Freq: Every day | ORAL | Status: DC

## 2012-08-29 NOTE — Progress Notes (Signed)
[facility]  RFV: going to [place]  Subjective:    Patient ID: Susan Cordova, female    DOB: 18-Jul-1970, 42 y.o.   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 She will be attending a food/cultural seminanr from June 1st hru the 10th then going onto machu piccu from the 11-14th.  Meds: none ION:GEXB Pmhx: bladder cancer in 2008 Past imms: flu vax this year Prior hx of travel: [place], [place], [place], [place]  Review of Systems     Objective:   Physical Exam        Assessment & Plan:  Pre travel vaccine = will give typhoid injection and hep A #1 today.  Traveler's diarrhea = gave tips sheet and rx for azithromycin  Malaria proph = will give #13 to cover traveling to endemic area from 6/9-6/14  Going to her pcp for possible booster of tetanus (last recvd 02/2004)  Altitude sickness= recommend can take ibuprofen 600-800 TID when at elevated altitude, keep hydrated

## 2012-09-12 ENCOUNTER — Ambulatory Visit (INDEPENDENT_AMBULATORY_CARE_PROVIDER_SITE_OTHER): Payer: 59 | Admitting: Family Medicine

## 2012-09-12 ENCOUNTER — Encounter: Payer: Self-pay | Admitting: Family Medicine

## 2012-09-12 ENCOUNTER — Ambulatory Visit: Payer: 59

## 2012-09-12 VITALS — BP 122/74 | HR 69 | Temp 98.4°F | Resp 16 | Ht 61.5 in | Wt 166.4 lb

## 2012-09-12 DIAGNOSIS — E559 Vitamin D deficiency, unspecified: Secondary | ICD-10-CM

## 2012-09-12 DIAGNOSIS — R0781 Pleurodynia: Secondary | ICD-10-CM

## 2012-09-12 DIAGNOSIS — Z0000 Encounter for general adult medical examination without abnormal findings: Secondary | ICD-10-CM

## 2012-09-12 DIAGNOSIS — R81 Glycosuria: Secondary | ICD-10-CM

## 2012-09-12 DIAGNOSIS — Z23 Encounter for immunization: Secondary | ICD-10-CM

## 2012-09-12 LAB — LIPID PANEL
Cholesterol: 218 mg/dL — ABNORMAL HIGH (ref 0–200)
LDL Cholesterol: 154 mg/dL — ABNORMAL HIGH (ref 0–99)
Total CHOL/HDL Ratio: 4.8 Ratio
Triglycerides: 97 mg/dL (ref <150)

## 2012-09-12 LAB — CBC WITH DIFFERENTIAL/PLATELET
Basophils Absolute: 0 10*3/uL (ref 0.0–0.1)
Lymphocytes Relative: 33 % (ref 12–46)
Lymphs Abs: 2 10*3/uL (ref 0.7–4.0)
MCV: 82.6 fL (ref 78.0–100.0)
Neutro Abs: 3.5 10*3/uL (ref 1.7–7.7)
Platelets: 215 10*3/uL (ref 150–400)
RBC: 4.49 MIL/uL (ref 3.87–5.11)
RDW: 14.7 % (ref 11.5–15.5)
WBC: 6.1 10*3/uL (ref 4.0–10.5)

## 2012-09-12 LAB — COMPREHENSIVE METABOLIC PANEL
ALT: 8 U/L (ref 0–35)
AST: 13 U/L (ref 0–37)
Albumin: 4.1 g/dL (ref 3.5–5.2)
CO2: 28 mEq/L (ref 19–32)
Calcium: 9.5 mg/dL (ref 8.4–10.5)
Chloride: 105 mEq/L (ref 96–112)
Potassium: 4.7 mEq/L (ref 3.5–5.3)
Sodium: 138 mEq/L (ref 135–145)
Total Protein: 6.7 g/dL (ref 6.0–8.3)

## 2012-09-12 LAB — POCT URINALYSIS DIPSTICK
Blood, UA: NEGATIVE
Glucose, UA: 100
Spec Grav, UA: 1.015
Urobilinogen, UA: 0.2
pH, UA: 5.5

## 2012-09-12 LAB — POCT UA - MICROSCOPIC ONLY
Casts, Ur, LPF, POC: NEGATIVE
Crystals, Ur, HPF, POC: NEGATIVE

## 2012-09-12 LAB — POCT GLYCOSYLATED HEMOGLOBIN (HGB A1C): Hemoglobin A1C: 5.2

## 2012-09-12 LAB — TSH: TSH: 1.595 u[IU]/mL (ref 0.350–4.500)

## 2012-09-12 NOTE — Progress Notes (Signed)
Subjective:    Patient ID: Susan Cordova, female    DOB: [birth year], 42 y.o.   MRN: [id number]   Chief Complaint  Patient presents with  . Annual Exam    no pap    HPI Saw Dr. Hal Hope and Dr. Garnette Gunner so primary care here at [facility].  Off to [place] soon - has malaria proph and prn zpack.  Sees Dr. Normand Sloop for pap smears - she has f/u in several wks, so abnml in 2012 removed with LEEP and nml last yr.  Bladder cancer in 2008 and no f/u needed now per urologist - Dr. Jeryl Columbia. Had some blood in urine for several years the found and treated with surgical removal - no problems w/ urination now. Was slow growing.  Has some tender spots - randomly on ribs - has been there for several months - they do move around and are bilaterally, are tender, no bruising. No changes w/ nsaids, only tender to palpation - no movement or pain w/ coughing and breathing deep Several coworkers w/ bone cancer.  Poss lyme exposure since grew up in [place] - wonders if this could have something to do with her various knee, shoulder, rib aches and pains since she did have a h/o abnml ANA once - but was neg on recheck.  No herbal supp. Does have a h/o vit D def.  Flat feet - sees Dr. 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 Plays tennis.  Past Medical History  Diagnosis Date  . Dysrhythmia   . Mitral valve prolapse   . Cancer 2008    Bladder  . Bladder cancer   . Anemia   . History of varicose veins   . Low iron   . Depression   . H/O varicella   . H/O rubella   . Candidal vaginitis 04/06/08  . 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 Abnormal menstrual cycle 07/27/10  . Heart murmur    Past Surgical History  Procedure Laterality Date  . Bladder surgery  2008    cancer - no chemo, no urostomy  . Leep    . Cystoscopy    . Leep  11/24/2010    Procedure: LOOP ELECTROSURGICAL EXCISION PROCEDURE (LEEP);  Surgeon: Michael Litter, MD;  Location: [facility];  Service:  Gynecology;  Laterality: N/A;  . Wisdom tooth extraction  1991  . Tubes in ear   1977   Current Outpatient Prescriptions on File Prior to Visit  Medication Sig Dispense Refill  . atovaquone-proguanil (MALARONE) 250-100 MG TABS Take 1 tablet by mouth daily. Start on June 9th, take daily until completed  13 tablet  0  . azithromycin (ZITHROMAX) 500 MG tablet Take 1 tablet (500 mg total) by mouth daily. Take 1 tab daily if needed for 3 looses stools or more a day, can stop if diarrhea resolves  5 tablet  0  . ibuprofen (ADVIL) 200 MG tablet Take 1 tablet (200 mg total) by mouth every 6 (six) hours as needed for pain.  30 tablet  0   No current facility-administered medications on file prior to visit.  No Known Allergies History   Social History  . Marital Status: Single    Spouse Name: N/A    Number of Children: N/A  . Years of Education: N/A   Occupational History  . professor [employer]   Social History Main Topics  . Smoking status: Never Smoker   . Smokeless tobacco: Never Used  .  Alcohol Use: Yes     Comment: rarely  . Drug Use: No  . Sexually Active: Not Currently    Birth Control/ Protection: Abstinence   Other Topics Concern  . None   Social History Narrative   Plays tennis and walks for exercise.   Family History  Problem Relation Age of Onset  . Cancer Mother 78    Breast  . Hyperlipidemia Father        Review of Systems  Constitutional: Negative.   HENT: Negative.   Eyes: Negative.   Respiratory: Negative.   Cardiovascular: Negative.   Gastrointestinal: Negative.   Endocrine: Negative.   Genitourinary: Negative.   Musculoskeletal: Negative.   Skin: Negative.   Allergic/Immunologic: Negative.   Neurological: Negative.   Hematological: Negative.   Psychiatric/Behavioral: Positive for sleep disturbance.       BP 122/74  Pulse 69  Temp(Src) 98.4 F (36.9 C) (Oral)  Resp 16  Ht 5' 1.5" (1.562 m)  Wt 166 lb 6.4 oz (75.479 kg)  BMI 30.94  kg/m2  SpO2 98%  LMP 08/19/2012 Objective:   Physical Exam  Constitutional: She is oriented to person, place, and time. She appears well-developed and well-nourished. No distress.  HENT:  Head: Normocephalic and atraumatic.  Right Ear: Tympanic membrane, external ear and ear canal normal.  Left Ear: Tympanic membrane, external ear and ear canal normal.  Nose: Nose normal. No mucosal edema or rhinorrhea.  Mouth/Throat: Uvula is midline, oropharynx is clear and moist and mucous membranes are normal. No posterior oropharyngeal erythema.  Eyes: Conjunctivae and EOM are normal. Pupils are equal, round, and reactive to light. Right eye exhibits no discharge. Left eye exhibits no discharge. No scleral icterus.  Neck: Normal range of motion. Neck supple. No thyromegaly present.  Cardiovascular: Normal rate, regular rhythm and intact distal pulses.   Murmur heard.  Systolic murmur is present with a grade of 2/6  Pulmonary/Chest: Effort normal and breath sounds normal. No respiratory distress.  Abdominal: Soft. Bowel sounds are normal. There is no tenderness.  Musculoskeletal: She exhibits no edema.  Lymphadenopathy:    She has no cervical adenopathy.  Neurological: She is alert and oriented to person, place, and time. She has normal reflexes.  Skin: Skin is warm and dry. She is not diaphoretic. No erythema.  Psychiatric: She has a normal mood and affect. Her behavior is normal.     Results for orders placed in visit on 09/12/12  POCT URINALYSIS DIPSTICK      Result Value Range   Color, UA yellow     Clarity, UA clear     Glucose, UA 100     Bilirubin, UA neg     Ketones, UA neg     Spec Grav, UA 1.015     Blood, UA neg     pH, UA 5.5     Protein, UA neg     Urobilinogen, UA 0.2     Nitrite, UA neg     Leukocytes, UA Negative         [facility] reading (PRIMARY) by  Dr. Clelia Croft. CXR: No acute abnormality. No source of rib pain seen. Assessment & Plan:  Need for prophylactic vaccination  with combined diphtheria-tetanus-pertussis (DTP) vaccine - Plan: Tdap vaccine greater than or equal to 7yo IM  Routine general medical examination at a health care facility - Plan: ANA, Sedimentation rate, Lipid panel, CBC with Differential, Comprehensive metabolic panel, TSH, POCT urinalysis dipstick, POCT UA - Microscopic Only. Follow-up with Dr. Normand Sloop  for pap due to recent LEEP 2012. Mammogram UTD 4/14 due to +FHx  Vitamin D deficiency - Plan: ANA, Sedimentation rate, Vitamin D 25 hydroxy, Lipid panel, CBC with Differential, Comprehensive metabolic panel, TSH, POCT urinalysis dipstick, POCT UA - Microscopic Only  Rib pain - Plan: DG Chest 2 View, ANA, Sedimentation rate, Lipid panel, CBC with Differential, Comprehensive metabolic panel, TSH, POCT urinalysis dipstick, POCT UA - Microscopic Only - unknown etiology - watchful waiting for now.  Glucosuria - Plan: POCT glycosylated hemoglobin (Hb A1C)  Meds ordered this encounter  Medications  . ergocalciferol (VITAMIN D2) 50000 UNITS capsule    Sig: Take 1 capsule (50,000 Units total) by mouth once a week.    Dispense:  4 capsule    Refill:  2

## 2012-09-12 NOTE — Progress Notes (Signed)
  Subjective:    Patient ID: Susan Cordova, female    DOB: Aug 12, 1970, 42 y.o.   MRN: [id number]  HPI  Saw Dr. Hal Hope and Dr. Garnette Gunner so primary care here  Off to [place].  Sees Dr. Normand Sloop for pap smears - she has f/u in several wks, so abnml in 2012 removed with LEEP and nml last yr.  Bladder cancer in 2008 and no f/u needed now per urology - Dr. Jeryl Columbia. Had some blood in urine for several years the found and treated with surgical removal - no problems w/ urination now. Slow growing.  Has some tender spots - randomly on ribs - has been there for several months - they do move around and are bilaterally, are tender, no bruising. No changes w/ nsaids, only tender to palpation - no movement or pain w/ coughing and breathing ddep Several coworkers w/ bone cancer.  Poss lyme exposure -   No herbal supp. Does have ah/o vit D  famhx of hpl Heart murmur Flat feet - sees Dr. Darrick Penna Family History  Problem Relation Age of Onset  . Cancer Mother     Breast      Review of Systems     Objective:   Physical Exam        Assessment & Plan:

## 2012-09-12 NOTE — Patient Instructions (Addendum)

## 2012-09-16 ENCOUNTER — Ambulatory Visit (INDEPENDENT_AMBULATORY_CARE_PROVIDER_SITE_OTHER): Payer: 59 | Admitting: Sports Medicine

## 2012-09-16 VITALS — BP 144/70 | Ht 61.5 in | Wt 155.0 lb

## 2012-09-16 DIAGNOSIS — M25511 Pain in right shoulder: Secondary | ICD-10-CM

## 2012-09-16 DIAGNOSIS — M25519 Pain in unspecified shoulder: Secondary | ICD-10-CM

## 2012-09-16 DIAGNOSIS — M7650 Patellar tendinitis, unspecified knee: Secondary | ICD-10-CM

## 2012-09-16 DIAGNOSIS — M7652 Patellar tendinitis, left knee: Secondary | ICD-10-CM

## 2012-09-16 LAB — ANA: Anti Nuclear Antibody(ANA): NEGATIVE

## 2012-09-16 NOTE — Assessment & Plan Note (Signed)
Based on normal exam I think most of this is biomechanical from problems with her tennis serve  I suggested some exercises that right the tennis serving motion with light weights Work on some general strengthening for the shoulder to use the rotator cuff series

## 2012-09-16 NOTE — Patient Instructions (Addendum)
Please do basic shoulder exercises- along with tennis serving motion  Have tennis pro look at your serve periodically  Continue current knee exercises  Work up to doing 1 legged squats with heels elevated on book   Please follow up in 2 months or before as needed  Thank you for seeing [place] today!

## 2012-09-16 NOTE — Assessment & Plan Note (Signed)
Continue using a patellar helix  Continue icing as needed  I modified her exercises to include a 1 leg plantar flexion squat and gradually build this up  Recheck if problems continue

## 2012-09-16 NOTE — Progress Notes (Signed)
Patient ID: Susan Cordova, female   DOB: Oct 12, 1970, 42 y.o.   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 Palpation normal with no warmth or joint line tenderness or patellar tenderness or condyle tenderness. ROM normal in flexion and extension and lower leg rotation. Ligaments with solid consistent endpoints including ACL, PCL, LCL, MCL. Negative Mcmurray's and provocative meniscal tests. Non painful patellar compression. Patellar and quadriceps tendons unremarkable. Hamstring and quadriceps strength is normal.  Shoulder: Inspection reveals no abnormalities, atrophy or asymmetry. Palpation is normal with no tenderness over AC joint or bicipital groove. ROM is full in all planes. Rotator cuff strength normal throughout. No signs of impingement with negative Neer and Hawkin's tests, empty can. Speeds and Yergason's tests normal. No labral pathology noted with negative Obrien's, negative clunk and good stability. Normal scapular function observed. No painful arc and no drop arm sign. No apprehension sign  Assess:

## 2012-09-18 ENCOUNTER — Encounter: Payer: Self-pay | Admitting: Family Medicine

## 2012-09-18 DIAGNOSIS — E559 Vitamin D deficiency, unspecified: Secondary | ICD-10-CM | POA: Insufficient documentation

## 2012-09-18 MED ORDER — ERGOCALCIFEROL 1.25 MG (50000 UT) PO CAPS: 50000.0000 [IU] | ORAL_CAPSULE | ORAL | Status: DC

## 2012-10-10 ENCOUNTER — Other Ambulatory Visit: Payer: Self-pay | Admitting: Physician Assistant

## 2012-10-10 ENCOUNTER — Encounter: Payer: Self-pay | Admitting: Family Medicine

## 2012-10-10 MED ORDER — ERGOCALCIFEROL 1.25 MG (50000 UT) PO CAPS: 50000.0000 [IU] | ORAL_CAPSULE | ORAL | Status: DC

## 2013-02-26 ENCOUNTER — Other Ambulatory Visit: Payer: Self-pay

## 2013-09-16 ENCOUNTER — Other Ambulatory Visit: Payer: Self-pay

## 2013-09-16 DIAGNOSIS — Z1231 Encounter for screening mammogram for malignant neoplasm of breast: Secondary | ICD-10-CM

## 2013-12-01 ENCOUNTER — Ambulatory Visit
Admission: RE | Admit: 2013-12-01 | Discharge: 2013-12-01 | Disposition: A | Discharge status: Home / Self Care | Payer: 59 | Source: Ambulatory Visit

## 2013-12-01 DIAGNOSIS — Z1231 Encounter for screening mammogram for malignant neoplasm of breast: Secondary | ICD-10-CM

## 2014-01-08 ENCOUNTER — Encounter: Payer: Self-pay | Admitting: Family Medicine

## 2014-01-08 ENCOUNTER — Encounter: Payer: 59 | Admitting: Family Medicine

## 2014-01-08 ENCOUNTER — Ambulatory Visit (INDEPENDENT_AMBULATORY_CARE_PROVIDER_SITE_OTHER): Payer: 59 | Admitting: Family Medicine

## 2014-01-08 VITALS — BP 115/74 | HR 62 | Temp 98.3°F | Resp 16 | Ht 62.0 in | Wt 168.0 lb

## 2014-01-08 DIAGNOSIS — Z136 Encounter for screening for cardiovascular disorders: Secondary | ICD-10-CM

## 2014-01-08 DIAGNOSIS — Z1389 Encounter for screening for other disorder: Secondary | ICD-10-CM

## 2014-01-08 DIAGNOSIS — Z1329 Encounter for screening for other suspected endocrine disorder: Secondary | ICD-10-CM

## 2014-01-08 DIAGNOSIS — Z0000 Encounter for general adult medical examination without abnormal findings: Secondary | ICD-10-CM

## 2014-01-08 DIAGNOSIS — N870 Mild cervical dysplasia: Secondary | ICD-10-CM

## 2014-01-08 DIAGNOSIS — Z1383 Encounter for screening for respiratory disorder NEC: Secondary | ICD-10-CM

## 2014-01-08 DIAGNOSIS — H6120 Impacted cerumen, unspecified ear: Secondary | ICD-10-CM

## 2014-01-08 DIAGNOSIS — Z23 Encounter for immunization: Secondary | ICD-10-CM

## 2014-01-08 DIAGNOSIS — H6121 Impacted cerumen, right ear: Secondary | ICD-10-CM

## 2014-01-08 DIAGNOSIS — E559 Vitamin D deficiency, unspecified: Secondary | ICD-10-CM

## 2014-01-08 DIAGNOSIS — M25549 Pain in joints of unspecified hand: Secondary | ICD-10-CM

## 2014-01-08 LAB — CBC WITH DIFFERENTIAL/PLATELET
Basophils Absolute: 0 10*3/uL (ref 0.0–0.1)
Basophils Relative: 0 % (ref 0–1)
Eosinophils Absolute: 0.1 10*3/uL (ref 0.0–0.7)
Eosinophils Relative: 1 % (ref 0–5)
HCT: 37.4 % (ref 36.0–46.0)
HEMOGLOBIN: 12.9 g/dL (ref 12.0–15.0)
LYMPHS ABS: 2.6 10*3/uL (ref 0.7–4.0)
LYMPHS PCT: 40 % (ref 12–46)
MCH: 28.7 pg (ref 26.0–34.0)
MCHC: 34.5 g/dL (ref 30.0–36.0)
MCV: 83.1 fL (ref 78.0–100.0)
MONOS PCT: 7 % (ref 3–12)
Monocytes Absolute: 0.5 10*3/uL (ref 0.1–1.0)
NEUTROS ABS: 3.4 10*3/uL (ref 1.7–7.7)
NEUTROS PCT: 52 % (ref 43–77)
PLATELETS: 228 10*3/uL (ref 150–400)
RBC: 4.5 MIL/uL (ref 3.87–5.11)
RDW: 13.7 % (ref 11.5–15.5)
WBC: 6.6 10*3/uL (ref 4.0–10.5)

## 2014-01-08 LAB — COMPREHENSIVE METABOLIC PANEL
ALBUMIN: 4.1 g/dL (ref 3.5–5.2)
ALK PHOS: 41 U/L (ref 39–117)
ALT: 9 U/L (ref 0–35)
AST: 12 U/L (ref 0–37)
BUN: 12 mg/dL (ref 6–23)
CALCIUM: 9.5 mg/dL (ref 8.4–10.5)
CO2: 26 meq/L (ref 19–32)
Chloride: 104 mEq/L (ref 96–112)
Creat: 0.81 mg/dL (ref 0.50–1.10)
GLUCOSE: 84 mg/dL (ref 70–99)
POTASSIUM: 4.6 meq/L (ref 3.5–5.3)
Sodium: 138 mEq/L (ref 135–145)
TOTAL PROTEIN: 6.7 g/dL (ref 6.0–8.3)
Total Bilirubin: 0.6 mg/dL (ref 0.2–1.2)

## 2014-01-08 LAB — LIPID PANEL
CHOL/HDL RATIO: 4.5 ratio
CHOLESTEROL: 198 mg/dL (ref 0–200)
HDL: 44 mg/dL (ref >39)
LDL Cholesterol: 140 mg/dL — ABNORMAL HIGH (ref 0–99)
Triglycerides: 72 mg/dL (ref <150)
VLDL: 14 mg/dL (ref 0–40)

## 2014-01-08 LAB — C-REACTIVE PROTEIN

## 2014-01-08 NOTE — Progress Notes (Signed)
After verbal consent, colace drops were placed in patients right ear. After waiting 5 minutes patients ear was flushed with peroxide and warm water at a 1:1 ratio. Large amount of cerumen removed, patient tolerated well with no complaints of discomfort.

## 2014-01-08 NOTE — Progress Notes (Addendum)
Subjective:  This chart was scribed for Delman Cheadle, MD by Mercy Moore, Medial Scribe. This patient was seen in room 24 and the patient's care was started at 4:00 PM.    Patient ID: Susan Cordova, female    DOB: Jan 27, 1971, 43 y.o.   MRN: [id number]  Chief Complaint  Patient presents with  . Annual Exam    no pap    HPI HPI Comments: Susan Cordova is a 43 y.o. female with PMHx of bladder cancer who presents to the [facility] for an annual physical.   Patient sees Dr. Charlesetta Garibaldi for pap smears due abnormal pap in 2012. Patient had dladder cancer 2008. Treated by Dr. Arletha Pili, urologist, with no further follow up necessary. Visits Dr. Oneida Alar for her flat feet. Family history of hight cholesterol.  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 Patient on prescription Vitamin D replacement for three months last year. Mammograms performed yearly due to family history of breast cancer. Last done one month ago; found normal. Tetanus vaccination one year previously.   Patient has not been taking Vitamin D supplements since last year.  In the last 6-10 months she reports development intermittent of joint pain in her her hands and she is concerned about arthritis. She denies visibile swelling, but she reports exacerbation with flexion of her digits and "bumping" her fingers. Patient locates pain  predominantly in IP joint of her left thumb and PIP joint of her right third finger. Patient denies treatment with inflammatory, ice or heat. Patient reports pain currently. She states that the pain in her left thumb has shown some improvement, but the pain in her third right finger has not. 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 Patient denies direct injury or causative trauma. She reports run of her inflammatory makers a few years ago: found to be elevated.  Patient is left hand dominant.  Six months ago patient reports noticing growth in her left nostril; she is  uncertain how long the growth has been present. When she last visited her dermatologist, it was unable to visualized and she was informed to follow up with her PCP. Does not interfere with breathing or cause pain.    Patient Active Problem List   Diagnosis Date Noted  . Vitamin D deficiency 09/18/2012  . Patellar tendinitis 08/05/2012  . CIN I (cervical intraepithelial neoplasia I) 10/05/2011  . Bilateral bunions 02/27/2011  . Gait abnormality 02/27/2011  . Right flank pain 01/12/2011  . SHOULDER PAIN, RIGHT 11/04/2007  . WRIST PAIN, RIGHT 11/04/2007  . FOOT PAIN, LEFT 11/04/2007  . EPICONDYLITIS, LATERAL 02/04/2007  . ENTHESOPATHY, KNEE NEC 02/04/2007   Past Medical History  Diagnosis Date  . Dysrhythmia   . Mitral valve prolapse   . Cancer 2008    Bladder  . Bladder cancer   . Anemia   . History of varicose veins   . Low iron   . Depression   . H/O varicella   . H/O rubella   . Candidal vaginitis 04/06/08  . 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 Abnormal menstrual cycle 07/27/10  . Heart murmur    Past Surgical History  Procedure Laterality Date  . Bladder surgery  2008    cancer - no chemo, no urostomy  . Leep    . Cystoscopy    . Leep  11/24/2010    Procedure: LOOP ELECTROSURGICAL EXCISION PROCEDURE (LEEP);  Surgeon: Betsy Coder, MD;  Location: [facility];  Service: Gynecology;  Laterality: N/A;  . Wisdom tooth extraction  1991  . Tubes in ear   1977   No Known Allergies Prior to Admission medications   Medication Sig Start Date End Date Taking? Authorizing Provider  atovaquone-proguanil (MALARONE) 250-100 MG TABS Take 1 tablet by mouth daily. Start on June 9th, take daily until completed 08/29/12   Carlyle Basques, MD  azithromycin (ZITHROMAX) 500 MG tablet Take 1 tablet (500 mg total) by mouth daily. Take 1 tab daily if needed for 3 looses stools or more a day, can stop if diarrhea resolves 08/29/12   Carlyle Basques, MD  ergocalciferol (VITAMIN D2) 50000 UNITS  capsule Take 1 capsule (50,000 Units total) by mouth once a week. 10/10/12   Chelle S Jeffery, PA-C  ibuprofen (ADVIL) 200 MG tablet Take 1 tablet (200 mg total) by mouth every 6 (six) hours as needed for pain. 11/24/10 12/04/10  Betsy Coder, MD   History   Social History  . Marital Status: Single    Spouse Name: N/A    Number of Children: N/A  . Years of Education: N/A   Occupational History  . professor [employer]   Social History Main Topics  . Smoking status: Never Smoker   . Smokeless tobacco: Never Used  . Alcohol Use: Yes     Comment: rarely  . Drug Use: No  . Sexual Activity: Not Currently    Birth Control/ Protection: Abstinence   Other Topics Concern  . Not on file   Social History Narrative   Plays tennis and walks for exercise. May have 3-5 alcoholic drinks per year.     Review of Systems  Constitutional: Negative for fever and chills.  HENT: Negative for ear discharge.   Musculoskeletal: Positive for arthralgias. Negative for joint swelling.  Neurological: Negative for weakness and numbness.  All other systems reviewed and are negative.      Objective:   Physical Exam  Nursing note and vitals reviewed. Constitutional: She is oriented to person, place, and time. She appears well-developed and well-nourished. No distress.  HENT:  Head: Normocephalic and atraumatic.  Right Ear: External ear and ear canal normal.  Left Ear: Tympanic membrane, external ear and ear canal normal.  Nose: Nose normal. No mucosal edema or rhinorrhea.  Mouth/Throat: Uvula is midline, oropharynx is clear and moist and mucous membranes are normal. No posterior oropharyngeal erythema.  Right ear: cerumen impaction. Unable to visual TM.  Pin point clear keratinized papule 1 mm on anterior inner nostril 12 o'clock at most distal aspect - no concerning features, pt reassured  Eyes: Conjunctivae and EOM are normal. Pupils are equal, round, and reactive to light. Right eye exhibits  no discharge. Left eye exhibits no discharge. No scleral icterus.  Neck: Normal range of motion. Neck supple. No thyromegaly present.  Cardiovascular: Normal rate, regular rhythm, normal heart sounds and intact distal pulses.   No murmur heard. 2/6 systolic ejection murmur at left sternal border   Pulmonary/Chest: Effort normal and breath sounds normal. No respiratory distress. She has no wheezes. She has no rales.  Abdominal: Soft. Bowel sounds are normal. There is no tenderness. There is no rebound and no guarding.  Musculoskeletal: Normal range of motion. She exhibits no edema.  Right hand: pain at PIP joint of her third finger. Left hand: pain at IP joint of thumb  Lymphadenopathy:    She has no cervical adenopathy.  Neurological: She is alert and oriented to person, place, and time. She  has normal reflexes.  Skin: Skin is warm and dry. She is not diaphoretic. No erythema.  Psychiatric: She has a normal mood and affect. Her behavior is normal.     Filed Vitals:   01/08/14 1535  BP: 115/74  Pulse: 62  Temp: 98.3 F (36.8 C)  TempSrc: Oral  Resp: 16  Height: 5\' 2"  (1.575 m)  Weight: 168 lb (76.204 kg)  SpO2: 99%        Assessment & Plan:   Routine general medical examination at a health care facility -  Yearly mammogram nml - last done 1 mo prev. Pelvics with gyn Dr Charlesetta Garibaldi, tdap done 2014.  Need for prophylactic vaccination and inoculation against influenza - Plan: Flu Vaccine QUAD 36+ mos IM  Vitamin D deficiency - Plan: Vit D  25 hydroxy (rtn osteoporosis monitoring) - not currently on any replacement.  Level still low at 26 so start once weekly replacement x 6 mos, then otc 1000-2000u/d, recheck next yr  CIN I (cervical intraepithelial neoplasia I) - follows with Dr. Charlesetta Garibaldi  Cerumen impaction, right - cleaned by lavage today  Pain in multiple finger joints - Plan: Sedimentation Rate, C-reactive protein - suspect OA, keep strengthening and streching, consider  glucosamine-chondroiton supp. Inflammatory markers normal so no further eval needed at this time.  Screening for cardiovascular, respiratory, and genitourinary diseases - Plan: Lipid panel, Comprehensive metabolic panel, CBC with Differential  Screening for thyroid disorder - Plan: TSH    I personally performed the services described in this documentation, which was scribed in my presence. The recorded information has been reviewed and considered, and addended by me as needed.  Delman Cheadle, MD MPH

## 2014-01-08 NOTE — Progress Notes (Deleted)
   Subjective:    Patient ID: Susan Cordova, female    DOB: October 03, 1970, 43 y.o.   MRN: [id number]  HPI    Review of Systems  Constitutional: Negative.   HENT: Negative.   Eyes: Negative.   Respiratory: Negative.   Cardiovascular: Negative.   Gastrointestinal: Negative.   Endocrine: Negative.   Genitourinary: Negative.   Musculoskeletal: Positive for arthralgias.  Skin: Negative.   Allergic/Immunologic: Negative.   Neurological: Negative.   Hematological: Negative.   Psychiatric/Behavioral: Negative.        Objective:   Physical Exam        Assessment & Plan:

## 2014-01-09 LAB — SEDIMENTATION RATE: SED RATE: 8 mm/h (ref 0–22)

## 2014-01-09 LAB — TSH: TSH: 2.079 u[IU]/mL (ref 0.350–4.500)

## 2014-01-09 LAB — VITAMIN D 25 HYDROXY (VIT D DEFICIENCY, FRACTURES): VIT D 25 HYDROXY: 26 ng/mL — AB (ref 30–89)

## 2014-01-09 MED ORDER — ERGOCALCIFEROL 1.25 MG (50000 UT) PO CAPS: 50000.0000 [IU] | ORAL_CAPSULE | ORAL | Status: DC

## 2014-01-14 ENCOUNTER — Encounter: Payer: Self-pay | Admitting: Family Medicine

## 2014-01-14 MED ORDER — ERGOCALCIFEROL 1.25 MG (50000 UT) PO CAPS: 50000.0000 [IU] | ORAL_CAPSULE | ORAL | Status: DC

## 2014-10-22 ENCOUNTER — Other Ambulatory Visit: Payer: Self-pay

## 2014-10-22 DIAGNOSIS — Z1231 Encounter for screening mammogram for malignant neoplasm of breast: Secondary | ICD-10-CM

## 2014-12-24 ENCOUNTER — Ambulatory Visit
Admission: RE | Admit: 2014-12-24 | Discharge: 2014-12-24 | Disposition: A | Discharge status: Home / Self Care | Payer: 59 | Source: Ambulatory Visit

## 2014-12-24 DIAGNOSIS — Z1231 Encounter for screening mammogram for malignant neoplasm of breast: Secondary | ICD-10-CM

## 2015-04-28 ENCOUNTER — Telehealth: Payer: Self-pay

## 2015-04-28 NOTE — Telephone Encounter (Signed)
PT has an appt. With a cardiology specialist// Requesting Entire [facility]  Medical Record. Will be faxing Medical record release form (@ [phone])  [phone] Please advise when ready for pick up

## 2015-05-02 NOTE — Progress Notes (Signed)
000   Cardiology Office Note   Date:  05/03/2015   ID:  Susan Cordova, DOB 1971/01/13, MRN [id number]  PCP:  Delman Cheadle, MD  Cardiologist:   Sharol Harness, MD   Chief Complaint  Patient presents with  . New Patient (Initial Visit)    no chest pain, no shortness of breath, no pain or cramping in legs, no edema, rarely has lightheaded and dizziness      History of Present Illness: Susan Cordova is a 45 y.o. female with mitral valve prolapse who presents for an evaluation of palpitations.  Susan Cordova has noted palpitations for the last 2 years.  Episodes occur once per day or once every other day and last for 1-2 seconds at a time.  She notices it most frequently when laying in bed at night or when watching television.  There is no associated shortness of breath, chest pain, lightheadedness, dizziness, nausea, vomiting, or diaphoresis.  Her father was recently diagnosed with atrial fibrillation, so her family members urged her to have her palpitations evaluated.  Susan Cordova exercises regularly by walking 5-6 days per week and plays tennis once every 2 weeks.  She denies any palpitations, chest pain, or shortnesss of breath with these activities. She has not noted any lower extremity edema, orthopnea or PND.  She drinks one to two cups of tea daily.  She denies any regular over-the-counter decongestant use.    Susan Cordova reports awakening suddenly from sleep and sometimes hearing herself snore.  She sleeps 9 hours each night but does not feel rested upon awakening.   Susan Cordova has a history of mitral valve prolapse and has not had an echo in 10 years.    Past Medical History  Diagnosis Date  . Dysrhythmia   . Mitral valve prolapse   . Cancer [facility]) 2008    Bladder  . Bladder cancer ([place])   . Anemia   . History of varicose veins   . Low iron   . Depression   . H/O varicella   . H/O rubella   . Candidal vaginitis 04/06/08  . 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 Abnormal  menstrual cycle 07/27/10  . Heart murmur   . Palpitations 05/03/2015  . Mitral valve prolapse 05/03/2015    Past Surgical History  Procedure Laterality Date  . Bladder surgery  2008    cancer - no chemo, no urostomy  . Leep    . Cystoscopy    . Leep  11/24/2010    Procedure: LOOP ELECTROSURGICAL EXCISION PROCEDURE (LEEP);  Surgeon: Betsy Coder, MD;  Location: [place] [facility];  Service: Gynecology;  Laterality: N/A;  . Wisdom tooth extraction  1991  . Tubes in ear   1977     Current Outpatient Prescriptions  Medication Sig Dispense Refill  . ferrous fumarate (HEMOCYTE - 106 MG FE) 325 (106 Fe) MG TABS tablet Take 1 tablet by mouth daily.     No current facility-administered medications for this visit.    Allergies:   Review of patient's allergies indicates no known allergies.    Social History:  The patient  reports that she has never smoked. She has never used smokeless tobacco. She reports that she drinks alcohol. She reports that she does not use illicit drugs.   Family History:  The patient's family history includes Cancer (age of onset: 15) in her mother; Cancer (age of onset: 79) in her paternal grandfather; Cancer (age of onset: 37) in her maternal grandmother;  Hyperlipidemia in her father; Hypertension in her mother; Kidney disease in her maternal grandfather.    ROS:  Please see the history of present illness.   Otherwise, review of systems are positive for none.   All other systems are reviewed and negative.    PHYSICAL EXAM: VS:  BP 142/86 mmHg  Pulse 75  Ht 5' 1" (1.549 m)  Wt 79.011 kg (174 lb 3 oz)  BMI 32.93 kg/m2  LMP 04/11/2015 , BMI Body mass index is 32.93 kg/(m^2). GENERAL:  Well appearing HEENT:  Pupils equal round and reactive, fundi not visualized, oral mucosa unremarkable NECK:  No jugular venous distention, waveform within normal limits, carotid upstroke brisk and symmetric, no bruits, no thyromegaly LYMPHATICS:  No cervical adenopathy LUNGS:  Clear to  auscultation bilaterally HEART:  RRR.  PMI not displaced or sustained,S1 and S2 within normal limits, no S3, no S4, no clicks, no rubs, III/VI early to late systolic murmur loudest across the anterior precordium and radiating to the carotids bilaterally. ABD:  Flat, positive bowel sounds normal in frequency in pitch, no bruits, no rebound, no guarding, no midline pulsatile mass, no hepatomegaly, no splenomegaly EXT:  2 plus pulses throughout, no edema, no cyanosis no clubbing SKIN:  No rashes no nodules NEURO:  Cranial nerves II through XII grossly intact, motor grossly intact throughout PSYCH:  Cognitively intact, oriented to person place and time    EKG:  EKG is ordered today. The ekg ordered today demonstrates Sinus rhythm and sinus arrhythmia.   Recent Labs: No results found for requested labs within last 365 days.   01/08/14:  Sodium 138, potassium 4.6, BUN 12, creatinine 0.81 AST 12, ALT 9 WBC 6.6, and hemoglobin 12.9, hematocrit 37.4, platelets 228 ESR dateTSH 2.1 Lipid Panel    Component Value Date/Time   CHOL 198 01/08/2014 1627   TRIG 72 01/08/2014 1627   HDL 44 01/08/2014 1627   CHOLHDL 4.5 01/08/2014 1627   VLDL 14 01/08/2014 1627   LDLCALC 140* 01/08/2014 1627      Wt Readings from Last 3 Encounters:  05/03/15 79.011 kg (174 lb 3 oz)  01/08/14 76.204 kg (168 lb)  09/16/12 70.308 kg (155 lb)      ASSESSMENT AND PLAN:  # Palpitations: I suspect that Ms. Cordova's symptoms are attributable to sinus arrhythmia.  It is also possible that she has PACs or PVCs.  Given the brief nature of the symptoms, it is unlikely that this represents a malignant arrhythmia.  We will obtain a basic metabolic panel, CBC and thyroid function studies to evaluate for other causes of arrhythmia.  We will also have her wear a 48 hour Holter monitor to determine the actual diagnosis.  # Mitral valve prolapse: Susan Cordova has a murmur that is concerning for prolapse of the posterior mitral  valve leaflet. We to evaluate the severity of her regurgitation. She does not appear to have heart failure on exam or by history.  # Snoring: Susan Cordova reports snoring and daytime somnolence.We will obtain asleep study to evaluate for obstructive sleep apnea.  Her Epworth sleepiness scale is 2, so it is unlikely that she has obstructive pnea.   Current medicines are reviewed at length with the patient today.  The patient does not have concerns regarding medicines.  The following changes have been made:  no change  Labs/ tests ordered today include:   Orders Placed This Encounter  Procedures  . Basic metabolic panel  . TSH  . Magnesium  .  CBC  . Holter monitor - 48 hour  . EKG 12-Lead  . ECHOCARDIOGRAM COMPLETE  . Split night study     Disposition:   FU with Demarri Elie C. Oval Linsey, MD, [facility] in 1 year.    This note was written with the assistance of speech recognition software.  Please excuse any transcriptional errors.  Signed, Atari Novick C. Oval Linsey, MD, [facility]  05/03/2015 12:57 PM

## 2015-05-03 ENCOUNTER — Encounter: Payer: Self-pay | Admitting: Cardiovascular Disease

## 2015-05-03 ENCOUNTER — Encounter (INDEPENDENT_AMBULATORY_CARE_PROVIDER_SITE_OTHER): Payer: BLUE CROSS/BLUE SHIELD

## 2015-05-03 ENCOUNTER — Ambulatory Visit (INDEPENDENT_AMBULATORY_CARE_PROVIDER_SITE_OTHER): Payer: BLUE CROSS/BLUE SHIELD | Admitting: Cardiovascular Disease

## 2015-05-03 VITALS — BP 142/86 | HR 75 | Ht 61.0 in | Wt 174.2 lb

## 2015-05-03 DIAGNOSIS — I340 Nonrheumatic mitral (valve) insufficiency: Secondary | ICD-10-CM

## 2015-05-03 DIAGNOSIS — R002 Palpitations: Secondary | ICD-10-CM

## 2015-05-03 DIAGNOSIS — R9431 Abnormal electrocardiogram [ECG] [EKG]: Secondary | ICD-10-CM

## 2015-05-03 DIAGNOSIS — I341 Nonrheumatic mitral (valve) prolapse: Secondary | ICD-10-CM | POA: Insufficient documentation

## 2015-05-03 DIAGNOSIS — R0683 Snoring: Secondary | ICD-10-CM

## 2015-05-03 DIAGNOSIS — R400 Somnolence: Secondary | ICD-10-CM

## 2015-05-03 DIAGNOSIS — G4710 Hypersomnia, unspecified: Secondary | ICD-10-CM

## 2015-05-03 HISTORY — DX: Palpitations: R00.2

## 2015-05-03 HISTORY — DX: Nonrheumatic mitral (valve) prolapse: I34.1

## 2015-05-03 LAB — CBC
HCT: 39.3 % (ref 36.0–46.0)
Hemoglobin: 13.4 g/dL (ref 12.0–15.0)
MCH: 28.6 pg (ref 26.0–34.0)
MCHC: 34.1 g/dL (ref 30.0–36.0)
MCV: 84 fL (ref 78.0–100.0)
MPV: 12.2 fL (ref 8.6–12.4)
PLATELETS: 230 10*3/uL (ref 150–400)
RBC: 4.68 MIL/uL (ref 3.87–5.11)
RDW: 14.1 % (ref 11.5–15.5)
WBC: 8 10*3/uL (ref 4.0–10.5)

## 2015-05-03 LAB — BASIC METABOLIC PANEL
BUN: 8 mg/dL (ref 7–25)
CALCIUM: 9.6 mg/dL (ref 8.6–10.2)
CO2: 26 mmol/L (ref 20–31)
Chloride: 105 mmol/L (ref 98–110)
Creat: 0.76 mg/dL (ref 0.50–1.10)
Glucose, Bld: 94 mg/dL (ref 65–99)
Potassium: 4.8 mmol/L (ref 3.5–5.3)
SODIUM: 138 mmol/L (ref 135–146)

## 2015-05-03 LAB — MAGNESIUM: MAGNESIUM: 2.2 mg/dL (ref 1.5–2.5)

## 2015-05-03 LAB — TSH: TSH: 1.961 u[IU]/mL (ref 0.350–4.500)

## 2015-05-03 NOTE — Patient Instructions (Signed)
Medication Instructions:  Please continue your current medications  Labwork: Your physician recommends that you return for lab work TODAY.  Testing/Procedures: 1. Echocardiogram - Your physician has requested that you have an echocardiogram. Echocardiography is a painless test that uses sound waves to create images of your heart. It provides your doctor with information about the size and shape of your heart and how well your heart's chambers and valves are working. This procedure takes approximately one hour. There are no restrictions for this procedure.  2. 48-hour Holter Monitor - Your physician has recommended that you wear a holter monitor. Holter monitors are medical devices that record the heart's electrical activity. Doctors most often use these monitors to diagnose arrhythmias. Arrhythmias are problems with the speed or rhythm of the heartbeat. The monitor is a small, portable device. You can wear one while you do your normal daily activities. This is usually used to diagnose what is causing palpitations/syncope (passing out).  Follow-Up: Dr Oval Linsey recommends that you follow-up with her as needed.  If you need a refill on your cardiac medications before your next appointment, please call your pharmacy.

## 2015-05-04 ENCOUNTER — Telehealth: Payer: Self-pay | Admitting: Cardiovascular Disease

## 2015-05-04 NOTE — Telephone Encounter (Signed)
EOD No return call from patient. Encounter closed.

## 2015-05-04 NOTE — Telephone Encounter (Signed)
Pt called stating that she is currently wearing a heart monitor and it has a day left but the battery is running low. She is concerned that she will lose all her data before the time is up. Please f/u with her  Thanks

## 2015-05-04 NOTE — Telephone Encounter (Signed)
Pt became concerned bc she woke up this AM and battery life indicator showed holter was about 1/3rd depleted. Since that time the indicator has not moved. She wanted to make sure if battery ran down would not be issue w/ loss of data.  Advised likely battery would run course for full monitoring time - conferred w/ Chelley who notes battery was new out of box - to call this afternoon if still concerned. Advised patient not to remove battery manually as this would cause loss of data. Tentatively, pt understands plan is that if battery ife continues to be a concern, she can come in this afternoon to have data recorded thus far downloaded and then set up for continued monitoring w/ new card & battery.

## 2015-05-05 ENCOUNTER — Telehealth: Payer: Self-pay | Admitting: *Deleted

## 2015-05-05 NOTE — Telephone Encounter (Signed)
Left detailed message on secure voicemail Release to my chart

## 2015-05-05 NOTE — Telephone Encounter (Signed)
-----   Message from Skeet Latch, MD sent at 05/05/2015  5:19 PM EST ----- Normal thyroid function, kidney function, electrolytes, and blood count

## 2015-05-12 ENCOUNTER — Other Ambulatory Visit (HOSPITAL_COMMUNITY): Payer: BLUE CROSS/BLUE SHIELD

## 2015-05-16 ENCOUNTER — Telehealth: Payer: Self-pay | Admitting: Cardiovascular Disease

## 2015-05-16 NOTE — Telephone Encounter (Signed)
Pt given results, no questions at this time.  Pt knows to call our office is she wants to start Metoprolol Succinate 25 mg daily, if the palpitations get worse or more bothersome.  Pt verbalized understanding no questions at this time.

## 2015-05-16 NOTE — Telephone Encounter (Signed)
Returning a Call about her results .Marland Kitchen Please call   Thanks

## 2015-05-24 ENCOUNTER — Other Ambulatory Visit (HOSPITAL_COMMUNITY): Payer: BLUE CROSS/BLUE SHIELD

## 2015-05-27 ENCOUNTER — Ambulatory Visit (HOSPITAL_COMMUNITY): Payer: BLUE CROSS/BLUE SHIELD | Attending: Cardiology

## 2015-05-27 ENCOUNTER — Other Ambulatory Visit: Payer: Self-pay

## 2015-05-27 DIAGNOSIS — I340 Nonrheumatic mitral (valve) insufficiency: Secondary | ICD-10-CM | POA: Insufficient documentation

## 2015-05-27 DIAGNOSIS — I341 Nonrheumatic mitral (valve) prolapse: Secondary | ICD-10-CM | POA: Insufficient documentation

## 2015-05-27 DIAGNOSIS — Z8249 Family history of ischemic heart disease and other diseases of the circulatory system: Secondary | ICD-10-CM | POA: Insufficient documentation

## 2015-06-08 ENCOUNTER — Telehealth: Payer: Self-pay | Admitting: Cardiovascular Disease

## 2015-06-08 NOTE — Telephone Encounter (Signed)
Susan Cordova is returning a call about her test results .

## 2015-06-10 NOTE — Telephone Encounter (Signed)
Follow up     Patient calling for test results - can leave voice mail

## 2015-06-10 NOTE — Telephone Encounter (Signed)
Results given, pt voiced understanding. Notes updated.

## 2015-06-10 NOTE — Telephone Encounter (Signed)
Called no answer, voicemail not set up  result of echo normal , no mitral valve prolapse per Dr Oval Linsey

## 2015-06-22 ENCOUNTER — Encounter (HOSPITAL_BASED_OUTPATIENT_CLINIC_OR_DEPARTMENT_OTHER): Payer: BLUE CROSS/BLUE SHIELD

## 2015-11-22 ENCOUNTER — Other Ambulatory Visit: Payer: Self-pay | Admitting: Family Medicine

## 2015-11-22 DIAGNOSIS — Z1231 Encounter for screening mammogram for malignant neoplasm of breast: Secondary | ICD-10-CM

## 2015-12-05 ENCOUNTER — Ambulatory Visit (INDEPENDENT_AMBULATORY_CARE_PROVIDER_SITE_OTHER): Payer: BLUE CROSS/BLUE SHIELD | Admitting: Family Medicine

## 2015-12-05 VITALS — BP 124/80 | HR 84 | Temp 99.3°F | Resp 18 | Ht 61.0 in | Wt 172.0 lb

## 2015-12-05 DIAGNOSIS — Z1389 Encounter for screening for other disorder: Secondary | ICD-10-CM | POA: Diagnosis not present

## 2015-12-05 DIAGNOSIS — Z1383 Encounter for screening for respiratory disorder NEC: Secondary | ICD-10-CM | POA: Diagnosis not present

## 2015-12-05 DIAGNOSIS — Z136 Encounter for screening for cardiovascular disorders: Secondary | ICD-10-CM | POA: Diagnosis not present

## 2015-12-05 DIAGNOSIS — E559 Vitamin D deficiency, unspecified: Secondary | ICD-10-CM

## 2015-12-05 DIAGNOSIS — Z130 Encounter for screening for diseases of the blood and blood-forming organs and certain disorders involving the immune mechanism: Secondary | ICD-10-CM

## 2015-12-05 DIAGNOSIS — Z0000 Encounter for general adult medical examination without abnormal findings: Secondary | ICD-10-CM | POA: Diagnosis not present

## 2015-12-05 LAB — POCT URINALYSIS DIP (MANUAL ENTRY)
Bilirubin, UA: NEGATIVE
Glucose, UA: NEGATIVE
Ketones, POC UA: NEGATIVE
NITRITE UA: NEGATIVE
PH UA: 5.5
Protein Ur, POC: NEGATIVE
RBC UA: NEGATIVE
SPEC GRAV UA: 1.02
UROBILINOGEN UA: 0.2

## 2015-12-05 LAB — COMPREHENSIVE METABOLIC PANEL
ALK PHOS: 46 U/L (ref 33–115)
ALT: 10 U/L (ref 6–29)
AST: 12 U/L (ref 10–35)
Albumin: 4.1 g/dL (ref 3.6–5.1)
BILIRUBIN TOTAL: 0.4 mg/dL (ref 0.2–1.2)
BUN: 10 mg/dL (ref 7–25)
CO2: 26 mmol/L (ref 20–31)
CREATININE: 0.79 mg/dL (ref 0.50–1.10)
Calcium: 9.2 mg/dL (ref 8.6–10.2)
Chloride: 106 mmol/L (ref 98–110)
Glucose, Bld: 91 mg/dL (ref 65–99)
Potassium: 4.4 mmol/L (ref 3.5–5.3)
SODIUM: 139 mmol/L (ref 135–146)
TOTAL PROTEIN: 6.8 g/dL (ref 6.1–8.1)

## 2015-12-05 LAB — LIPID PANEL
Cholesterol: 231 mg/dL — ABNORMAL HIGH (ref 125–200)
HDL: 52 mg/dL (ref >=46)
LDL CALC: 163 mg/dL — AB (ref <130)
Total CHOL/HDL Ratio: 4.4 Ratio (ref <=5.0)
Triglycerides: 79 mg/dL (ref <150)
VLDL: 16 mg/dL (ref <30)

## 2015-12-05 LAB — CBC
HCT: 34 % — ABNORMAL LOW (ref 35.0–45.0)
Hemoglobin: 11 g/dL — ABNORMAL LOW (ref 11.7–15.5)
MCH: 26.8 pg — ABNORMAL LOW (ref 27.0–33.0)
MCHC: 32.4 g/dL (ref 32.0–36.0)
MCV: 82.9 fL (ref 80.0–100.0)
MPV: 11.2 fL (ref 7.5–12.5)
PLATELETS: 237 10*3/uL (ref 140–400)
RBC: 4.1 MIL/uL (ref 3.80–5.10)
RDW: 14.3 % (ref 11.0–15.0)
WBC: 5.2 10*3/uL (ref 3.8–10.8)

## 2015-12-05 LAB — VITAMIN D 25 HYDROXY (VIT D DEFICIENCY, FRACTURES): VIT D 25 HYDROXY: 22 ng/mL — AB (ref 30–100)

## 2015-12-05 MED ORDER — TRIAMCINOLONE ACETONIDE 0.1 % EX CREA: 1.0000 "application " | TOPICAL_CREAM | Freq: Two times a day (BID) | CUTANEOUS | 0 refills | Status: DC

## 2015-12-05 MED ORDER — DIPHENHYDRAMINE HCL 2 % EX GEL: 1.0000 "application " | Freq: Four times a day (QID) | CUTANEOUS | 0 refills | Status: DC | PRN

## 2015-12-05 NOTE — Patient Instructions (Addendum)
IF you received an x-ray today, you will receive an invoice from [facility]. Please contact [facility] at [phone] with questions or concerns regarding your invoice.   IF you received labwork today, you will receive an invoice from [employer]. Please contact [facility] at [phone] with questions or concerns regarding your invoice.   Our billing staff will not be able to assist you with questions regarding bills from these companies.  You will be contacted with the lab results as soon as they are available. The fastest way to get your results is to activate your My Chart account. Instructions are located on the last page of this paperwork. If you have not heard from [place] regarding the results in 2 weeks, please contact this office.    Bone Health Bones protect organs, store calcium, and anchor muscles. Good health habits, such as eating nutritious foods and exercising regularly, are important for maintaining healthy bones. They can also help to prevent a condition that causes bones to lose density and become weak and brittle (osteoporosis). WHY IS BONE MASS IMPORTANT? Bone mass refers to the amount of bone tissue that you have. The higher your bone mass, the stronger your bones. An important step toward having healthy bones throughout life is to have strong and dense bones during childhood. A young adult who has a high bone mass is more likely to have a high bone mass later in life. Bone mass at its greatest it is called peak bone mass. A large decline in bone mass occurs in older adults. In women, it occurs about the time of menopause. During this time, it is important to practice good health habits, because if more bone is lost than what is replaced, the bones will become less healthy and more likely to break (fracture). If you find that you have a low bone mass, you may be able to prevent osteoporosis or further bone loss by changing your diet  and lifestyle. HOW CAN I FIND OUT IF MY BONE MASS IS LOW? Bone mass can be measured with an X-ray test that is called a bone mineral density (BMD) test. This test is recommended for all women who are age 25 or older. It may also be recommended for men who are age 95 or older, or for people who are more likely to develop osteoporosis due to:  Having bones that break easily.  Having a long-term disease that weakens bones, such as kidney disease or rheumatoid arthritis.  Having menopause earlier than normal.  Taking medicine that weakens bones, such as steroids, thyroid hormones, or hormone treatment for breast cancer or prostate cancer.  Smoking.  Drinking three or more alcoholic drinks each day. WHAT ARE THE NUTRITIONAL RECOMMENDATIONS FOR HEALTHY BONES? To have healthy bones, you need to get enough of the right minerals and vitamins. Most nutrition experts recommend getting these nutrients from the foods that you eat. Nutritional recommendations vary from person to person. Ask your health care provider what is healthy for you. Here are some general guidelines. Calcium Recommendations Calcium is the most important (essential) mineral for bone health. Most people can get enough calcium from their diet, but supplements may be recommended for people who are at risk for osteoporosis. Good sources of calcium include:  Dairy products, such as low-fat or nonfat milk, cheese, and yogurt.  Dark green leafy vegetables, such as bok choy and broccoli.  Calcium-fortified foods, such as orange juice, cereal, bread, soy beverages, and tofu products.  Nuts, such as almonds. Follow these recommended amounts for daily calcium intake:  Children, age 52-3: 700 mg.  Children, age 517-8: 1,000 mg.  Children, age 36-13: 1,300 mg.  Teens, age 54-18: 1,300 mg.  Adults, age 84-50: 1,000 mg.  Adults, age 88-70:  Men: 1,000 mg.  Women: 1,200 mg.  Adults, age 64 or older: 1,200 mg.  Pregnant and  breastfeeding females:  Teens: 1,300 mg.  Adults: 1,000 mg. Vitamin D Recommendations Vitamin D is the most essential vitamin for bone health. It helps the body to absorb calcium. Sunlight stimulates the skin to make vitamin D, so be sure to get enough sunlight. If you live in a cold climate or you do not get outside often, your health care provider may recommend that you take vitamin D supplements. Good sources of vitamin D in your diet include:  Egg yolks.  Saltwater fish.  Milk and cereal fortified with vitamin D. Follow these recommended amounts for daily vitamin D intake:  Children and teens, age 52-18: 600 international units.  Adults, age 46 or younger: 400-800 international units.  Adults, age 58 or older: 800-1,000 international units. Other Nutrients Other nutrients for bone health include:  Phosphorus. This mineral is found in meat, poultry, dairy foods, nuts, and legumes. The recommended daily intake for adult men and adult women is 700 mg.  Magnesium. This mineral is found in seeds, nuts, dark green vegetables, and legumes. The recommended daily intake for adult men is 400-420 mg. For adult women, it is 310-320 mg.  Vitamin K. This vitamin is found in green leafy vegetables. The recommended daily intake is 120 mg for adult men and 90 mg for adult women. WHAT TYPE OF PHYSICAL ACTIVITY IS BEST FOR BUILDING AND MAINTAINING HEALTHY BONES? Weight-bearing and strength-building activities are important for building and maintaining peak bone mass. Weight-bearing activities cause muscles and bones to work against gravity. Strength-building activities increases muscle strength that supports bones. Weight-bearing and muscle-building activities include:  Walking and hiking.  Jogging and running.  Dancing.  Gym exercises.  Lifting weights.  Tennis and racquetball.  Climbing stairs.  Aerobics. Adults should get at least 30 minutes of moderate physical activity on most  days. Children should get at least 60 minutes of moderate physical activity on most days. Ask your health care provide what type of exercise is best for you. WHERE CAN I FIND MORE INFORMATION? For more information, check out the following websites:  [place]: YardHomes.se  [employer] of Health: http://www.niams.AnonymousEar.fr.asp   This information is not intended to replace advice given to you by your health care provider. Make sure you discuss any questions you have with your health care provider.   Document Released: 06/30/2003 Document Revised: 08/24/2014 Document Reviewed: 04/14/2014 Elsevier Interactive Patient Education [employer].

## 2015-12-05 NOTE — Progress Notes (Signed)
Subjective:  By signing my name below, I, [name], attest that this documentation has been prepared under the direction and in the presence of Delman Cheadle, MD. Electronically Signed: [name], [place]. 12/05/2015 , 8:49 AM .  Patient was seen in Room 12 .   Patient ID: Susan Cordova, female    DOB: Oct 21, 1970, 45 y.o.   MRN: [id number] Chief Complaint  Patient presents with  . Annual Exam   HPI Susan Cordova is a 45 y.o. female who presents to [facility] for annual physical.  Last visit was 2 years prior for CPE. She follows with Dr. Charlesetta Garibaldi for pap smear, which was abnormal in 2012. H/o bladder cancer in 2008, followed by Dr. Kathyrn Lass with Urology. Patient is an athlete who plays tennis and follows close with Dr. Oneida Alar for MSK problems. She enjoys pottery. She sees dermatology regularly. She also has positive family history for breast cancer, having mammogram done annually. She works as Sales executive at [employer].   She has had some elevated and inflammatory markers of unknown etiology. Earlier this year, she saw Dr. Oval Linsey with cardiology due to mitral valve prolapse and worsening palpitations, which Dr. Oval Linsey suspected due to PAC's vs PVC. Patient was placed on 48 hour Holter monitor, and referred her to sleep study due to snoring and non restful sleep. Was offered to start metoprolol, 48 hour Holter normal, echo was normal with no mitral valve prolapse seen; labs normal.   Patient's Tdap was last done in 2014. She states she will not do the sleep study. She agreed to have her palpitations checked out because her father was recently diagnosed with afib. She denies taking supplements. She has plenty of calcium in her diet. She mostly walks, and denies having as much weight bearing exercises anymore.   Her next mammogram is scheduled for mid-September.  Her next appointment with Dr. Charlesetta Garibaldi is next month.  She sees both dentist and eye doctor regularly.   Patient  requests prescription hydrocortisone cream because she recently had fleas in her house.   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   Past Medical History:  Diagnosis Date  . Abnormal menstrual cycle 07/27/10  . Anemia   . 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 Bladder cancer ([place])   . Cancer [facility]) 2008   Bladder  . Candidal vaginitis 04/06/08  . Depression   . Dysrhythmia   . H/O rubella   . H/O varicella   . Heart murmur   . History of varicose veins   . Low iron   . Mitral valve prolapse   . Mitral valve prolapse 05/03/2015  . Palpitations 05/03/2015   Prior to Admission medications   Medication Sig Start Date End Date Taking? Authorizing Provider  ferrous fumarate (HEMOCYTE - 106 MG FE) 325 (106 Fe) MG TABS tablet Take 1 tablet by mouth daily.    Historical Provider, MD   No Known Allergies   Past Surgical History:  Procedure Laterality Date  . BLADDER SURGERY  2008   cancer - no chemo, no urostomy  . CYSTOSCOPY    . LEEP    . LEEP  11/24/2010   Procedure: LOOP ELECTROSURGICAL EXCISION PROCEDURE (LEEP);  Surgeon: Betsy Coder, MD;  Location: [facility];  Service: Gynecology;  Laterality: N/A;  . tubes in ear   1977  . WISDOM TOOTH EXTRACTION  1991   Family History  Problem Relation Age of Onset  .  Cancer Mother 80    Breast  . Hypertension Mother   . Hyperlipidemia Father   . Arrhythmia Father     atrial fibrillation  . Cancer Maternal Grandmother 85    breast  . Kidney disease Maternal Grandfather   . Cancer Paternal Grandfather 27    colon cancer; skin cancer  . Dementia Paternal Grandmother 54   Social History   Social History  . Marital status: Single    Spouse name: N/A  . Number of children: N/A  . 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 Smoking status: Never Smoker  . Smokeless tobacco: Never Used  . Alcohol use Yes      Comment: rarely  . Drug use: No  . Sexual activity: Not Currently    Birth control/ protection: Abstinence   Other Topics Concern  . None   Social History Narrative   Plays tennis and walks for exercise. May have 3-5 alcoholic drinks per year.            Epworth Sleepiness Scale = 2 (as of 05/03/2015)    Review of Systems  Constitutional: Negative for chills, fatigue, fever and unexpected weight change.  Respiratory: Negative for cough.   Cardiovascular: Positive for palpitations.  Gastrointestinal: Negative for constipation, diarrhea, nausea and vomiting.  Skin: Negative for rash and wound.  Neurological: Negative for dizziness, weakness and headaches.  All other systems reviewed and are negative.      Objective:   Physical Exam  Constitutional: She is oriented to person, place, and time. She appears well-developed and well-nourished. No distress.  HENT:  Head: Normocephalic and atraumatic.  Right Ear: Tympanic membrane is retracted. A middle ear effusion is present.  Left Ear: Tympanic membrane is retracted. A middle ear effusion is present.  Left nare superior anterior aspect, extending over to lateral wall, with some thickening of the mucosa without overlying mucosal change  Eyes: EOM are normal. Pupils are equal, round, and reactive to light.  Neck: Neck supple. No thyromegaly present.  Cardiovascular: Normal rate.   Pulmonary/Chest: Effort normal. No respiratory distress.  Abdominal: Soft. Bowel sounds are normal.  Musculoskeletal: Normal range of motion.  Lymphadenopathy:    She has no cervical adenopathy.  Neurological: She is alert and oriented to person, place, and time.  Skin: Skin is warm and dry.  Psychiatric: She has a normal mood and affect. Her behavior is normal.  Nursing note and vitals reviewed.   BP 124/80 (BP Location: Right Arm, Patient Position: Sitting, Cuff Size: Small)   Pulse 84   Temp 99.3 F (37.4 C) (Oral)   Resp 18   Ht 5\' 1"  (1.549  m)   Wt 172 lb (78 kg)   LMP 11/09/2015   SpO2 99%   BMI 32.50 kg/m     Visual Acuity Screening   Right eye Left eye Both eyes  Without correction:     With correction: 20/15 20/20 20/15        Assessment & Plan:   1. Annual physical exam   2. Screening for cardiovascular, respiratory, and genitourinary diseases   3. Screening for deficiency anemia   4. Vitamin D deficiency    Flea bites - try top diphenhydramine, if doesn't help then try top TAC.  Orders Placed This Encounter  Procedures  . CBC  . Comprehensive metabolic panel    Order Specific Question:   Has the patient fasted?  Answer:   Yes  . Lipid panel    Order Specific Question:   Has the patient fasted?    Answer:   Yes  . VITAMIN D 25 Hydroxy (Vit-D Deficiency, Fractures)  . Care order/instruction:    AVS - please print after labs are drawn so pt can go.  Marland Kitchen POCT urinalysis dipstick   Pt will sched her mammogram which is due next mo.  I personally performed the services described in this documentation, which was scribed in my presence. The recorded information has been reviewed and considered, and addended by me as needed.   Delman Cheadle, M.D.  Urgent [place], [place] [address] [phone] phone [phone] fax  12/05/15 10:44 PM   Results for orders placed or performed in visit on 12/05/15  CBC  Result Value Ref Range   WBC 5.2 3.8 - 10.8 K/uL   RBC 4.10 3.80 - 5.10 MIL/uL   Hemoglobin 11.0 (L) 11.7 - 15.5 g/dL   HCT 34.0 (L) 35.0 - 45.0 %   MCV 82.9 80.0 - 100.0 fL   MCH 26.8 (L) 27.0 - 33.0 pg   MCHC 32.4 32.0 - 36.0 g/dL   RDW 14.3 11.0 - 15.0 %   Platelets 237 140 - 400 K/uL   MPV 11.2 7.5 - 12.5 fL  Comprehensive metabolic panel  Result Value Ref Range   Sodium 139 135 - 146 mmol/L   Potassium 4.4 3.5 - 5.3 mmol/L   Chloride 106 98 - 110 mmol/L   CO2 26 20 - 31 mmol/L   Glucose, Bld 91 65 - 99 mg/dL   BUN 10 7 - 25 mg/dL   Creat  0.79 0.50 - 1.10 mg/dL   Total Bilirubin 0.4 0.2 - 1.2 mg/dL   Alkaline Phosphatase 46 33 - 115 U/L   AST 12 10 - 35 U/L   ALT 10 6 - 29 U/L   Total Protein 6.8 6.1 - 8.1 g/dL   Albumin 4.1 3.6 - 5.1 g/dL   Calcium 9.2 8.6 - 10.2 mg/dL  Lipid panel  Result Value Ref Range   Cholesterol 231 (H) 125 - 200 mg/dL   Triglycerides 79 <150 mg/dL   HDL 52 >=46 mg/dL   Total CHOL/HDL Ratio 4.4 <=5.0 Ratio   VLDL 16 <30 mg/dL   LDL Cholesterol 163 (H) <130 mg/dL  VITAMIN D 25 Hydroxy (Vit-D Deficiency, Fractures)  Result Value Ref Range   Vit D, 25-Hydroxy 22 (L) 30 - 100 ng/mL  POCT urinalysis dipstick  Result Value Ref Range   Color, UA yellow yellow   Clarity, UA hazy (A) clear   Glucose, UA negative negative   Bilirubin, UA negative negative   Ketones, POC UA negative negative   Spec Grav, UA 1.020    Blood, UA negative negative   pH, UA 5.5    Protein Ur, POC negative negative   Urobilinogen, UA 0.2    Nitrite, UA Negative Negative   Leukocytes, UA small (1+) (A) Negative

## 2015-12-06 ENCOUNTER — Encounter: Payer: Self-pay | Admitting: Family Medicine

## 2015-12-30 ENCOUNTER — Ambulatory Visit
Admission: RE | Admit: 2015-12-30 | Discharge: 2015-12-30 | Disposition: A | Discharge status: Home / Self Care | Payer: BLUE CROSS/BLUE SHIELD | Source: Ambulatory Visit | Attending: Family Medicine | Admitting: Family Medicine

## 2015-12-30 DIAGNOSIS — Z1231 Encounter for screening mammogram for malignant neoplasm of breast: Secondary | ICD-10-CM | POA: Diagnosis not present

## 2016-01-23 DIAGNOSIS — N871 Moderate cervical dysplasia: Secondary | ICD-10-CM | POA: Diagnosis not present

## 2016-01-23 DIAGNOSIS — Z01419 Encounter for gynecological examination (general) (routine) without abnormal findings: Secondary | ICD-10-CM | POA: Diagnosis not present

## 2016-01-23 DIAGNOSIS — R87610 Atypical squamous cells of undetermined significance on cytologic smear of cervix (ASC-US): Secondary | ICD-10-CM | POA: Diagnosis not present

## 2016-01-23 DIAGNOSIS — Z124 Encounter for screening for malignant neoplasm of cervix: Secondary | ICD-10-CM | POA: Diagnosis not present

## 2016-02-22 DIAGNOSIS — Z23 Encounter for immunization: Secondary | ICD-10-CM | POA: Diagnosis not present

## 2016-03-20 DIAGNOSIS — D2212 Melanocytic nevi of left eyelid, including canthus: Secondary | ICD-10-CM | POA: Diagnosis not present

## 2016-03-20 DIAGNOSIS — D2261 Melanocytic nevi of right upper limb, including shoulder: Secondary | ICD-10-CM | POA: Diagnosis not present

## 2016-03-20 DIAGNOSIS — L218 Other seborrheic dermatitis: Secondary | ICD-10-CM | POA: Diagnosis not present

## 2016-03-20 DIAGNOSIS — D2262 Melanocytic nevi of left upper limb, including shoulder: Secondary | ICD-10-CM | POA: Diagnosis not present

## 2016-11-19 ENCOUNTER — Other Ambulatory Visit: Payer: Self-pay | Admitting: Family Medicine

## 2016-11-19 DIAGNOSIS — Z1231 Encounter for screening mammogram for malignant neoplasm of breast: Secondary | ICD-10-CM

## 2016-12-05 NOTE — Progress Notes (Signed)
Subjective:    Patient ID: Susan Cordova, female    DOB: April 09, 1971, 46 y.o.   MRN: [id number] Chief Complaint  Patient presents with  . Annual Exam    no pap    HPI  Susan Cordova is a 46 yo here today for her complete physical.  Primary Preventative Screenings: Cervical Cancer: Dr. Gwynneth Munson Dillard seen 12/2015 - last done then nml. Has abnml in 2012 Family Planning: Normal menses  STI screening:  Breast Cancer: mammogram 12/30/15 - + FHx Colorectal Cancer: + FHx in paternal GF but no sxs in dad. Tobacco use: Bone Density: Cardiac: 10 yr ASCVD risk 1.1% with baseline of 0.5% last year Weight/blood sugar: Has lost a lb in the past yr but is 16 lbs up from 4 yrs ago - planning to increase back on vigorous exercise OTC/vit/supp/herbal: plenty of calcium in her diet, Vit D 1 tab/d otc.  Diet/Exercise/EtOH/substances: Dentist/Optho: sees both regularly Immunizations:  Immunization History  Administered Date(s) Administered  . Hepatitis A 08/29/2012  . Influenza,inj,Quad PF,36+ Mos 01/08/2014  . Influenza-Unspecified 02/22/2016  . Tdap 09/12/2012  . Typhoid Inactivated 08/29/2012      Chronic Medical Conditions: H/o bladder cancer in 2008, no further f/u needed per Dr. Kathyrn Lass with [facility].  H/o MVP resolved on last echo 2017 - seen by Dr. Donette Larry. Rec BB for palp. + FHx a. Fib in father. Very minimal sxs. Vit D def: otc 1x/d. Lab Results  Component Value Date   VD25OH 22 (L) 12/05/2015   VD25OH 26 (L) 01/08/2014   VD25OH 22 (L) 09/12/2012     Patient is an athlete who plays tennis and follows close with Dr. Oneida Alar for MSK problems. She enjoys pottery.  She sees dermatology regularly. Dr. [place] annually.  Works as a Sales executive at [employer].  Past Medical History:  Diagnosis Date  . Abnormal menstrual cycle 07/27/10  . Anemia   . 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 Bladder cancer ([place])   . Cancer [facility]) 2008   Bladder  . Candidal  vaginitis 04/06/08  . Depression   . Dysrhythmia   . H/O rubella   . H/O varicella   . Heart murmur   . History of varicose veins   . Low iron   . Mitral valve prolapse   . Mitral valve prolapse 05/03/2015  . Palpitations 05/03/2015   Past Surgical History:  Procedure Laterality Date  . BLADDER SURGERY  2008   cancer - no chemo, no urostomy  . CYSTOSCOPY    . LEEP    . LEEP  11/24/2010   Procedure: LOOP ELECTROSURGICAL EXCISION PROCEDURE (LEEP);  Surgeon: Betsy Coder, MD;  Location: [facility];  Service: Gynecology;  Laterality: N/A;  . tubes in ear   1977  . WISDOM TOOTH EXTRACTION  1991   Current Outpatient Prescriptions on File Prior to Visit  Medication Sig Dispense Refill  . DIPHENHYDRAMINE HCL, TOPICAL, 2 % GEL Apply 1 application topically 4 (four) times daily as needed. (Patient not taking: Reported on 12/06/2016) 30 g 0  . ferrous fumarate (HEMOCYTE - 106 MG FE) 325 (106 Fe) MG TABS tablet Take 1 tablet by mouth daily.    Marland Kitchen triamcinolone cream (KENALOG) 0.1 % Apply 1 application topically 2 (two) times daily. (Patient not taking: Reported on 12/06/2016) 30 g 0   No current facility-administered medications on file prior to visit.    No Known Allergies Family History  Problem Relation Age of Onset  .  Cancer Mother 74       Breast  . Hypertension Mother   . Hyperlipidemia Father   . Arrhythmia Father        atrial fibrillation  . Cancer Maternal Grandmother 98       breast  . Kidney disease Maternal Grandfather   . Cancer Paternal Grandfather 63       colon cancer; skin cancer  . Dementia Paternal Grandmother 52   Social History   Social History  . Marital status: Single    Spouse name: N/A  . Number of children: N/A  . 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 Smoking status: Never Smoker  . Smokeless tobacco: Never Used  . Alcohol use Yes     Comment: rarely  . Drug use: No  . Sexual  activity: Not Currently    Birth control/ protection: Abstinence   Other Topics Concern  . None   Social History Narrative   Plays tennis and walks for exercise. May have 3-5 alcoholic drinks per year.            Epworth Sleepiness Scale = 2 (as of 05/03/2015)   Depression screen [facility] 2/9 12/06/2016 12/05/2015 01/08/2014  Decreased Interest 0 0 0  Down, Depressed, Hopeless 0 0 0  PHQ - 2 Score 0 0 0    Review of Systems See hpi    Objective:   Physical Exam  Constitutional: She is oriented to person, place, and time. She appears well-developed and well-nourished. No distress.  HENT:  Head: Normocephalic and atraumatic.  Right Ear: Tympanic membrane, external ear and ear canal normal.  Left Ear: Tympanic membrane, external ear and ear canal normal.  Nose: Nose normal. No mucosal edema or rhinorrhea.  Mouth/Throat: Uvula is midline, oropharynx is clear and moist and mucous membranes are normal. No posterior oropharyngeal erythema.  Eyes: Pupils are equal, round, and reactive to light. Conjunctivae and EOM are normal. Right eye exhibits no discharge. Left eye exhibits no discharge. No scleral icterus.  Neck: Normal range of motion. Neck supple. No thyromegaly present.  Cardiovascular: Normal rate, regular rhythm, normal heart sounds and intact distal pulses.   Pulmonary/Chest: Effort normal and breath sounds normal. No respiratory distress.  Abdominal: Soft. Bowel sounds are normal. There is no tenderness.  Musculoskeletal: She exhibits no edema.  Lymphadenopathy:    She has no cervical adenopathy.  Neurological: She is alert and oriented to person, place, and time. She has normal reflexes.  Skin: Skin is warm and dry. She is not diaphoretic. No erythema.  Psychiatric: She has a normal mood and affect. Her behavior is normal.      BP 133/83 (BP Location: Right Arm, Patient Position: Sitting, Cuff Size: Large)   Pulse 65   Temp 98.7 F (37.1 C) (Oral)   Resp 18   Ht 5' 2.25"  (1.581 m)   Wt 171 lb 12.8 oz (77.9 kg)   LMP 11/21/2016   SpO2 97%   BMI 31.17 kg/m    Visual Acuity Screening   Right eye Left eye Both eyes  Without correction:     With correction: 20/20 20/40 20/20     Assessment & Plan:  Hiv, ua, cbc, cmp, lipid, tsh, vit D Update pap in HM  1. Annual physical exam   2. Routine screening for STI (sexually transmitted infection)   3. Screening for cardiovascular, respiratory, and genitourinary diseases   4. Screening  for deficiency anemia   5. Screening for thyroid disorder   6. Vitamin D deficiency     Orders Placed This Encounter  Procedures  . VITAMIN D 25 Hydroxy (Vit-D Deficiency, Fractures)  . CBC with Differential/Platelet  . Comprehensive metabolic panel    Order Specific Question:   Has the patient fasted?    Answer:   Yes  . Lipid panel    Order Specific Question:   Has the patient fasted?    Answer:   Yes  . TSH  . HIV antibody  . POCT urinalysis dipstick    Meds ordered this encounter  Medications  . VITAMIN D, ERGOCALCIFEROL, PO    Sig: Take by mouth.    Delman Cheadle, M.D.  Primary Care at [facility] [address] [place], [place] [address] [phone] phone [phone] fax  12/09/16 7:53 AM

## 2016-12-06 ENCOUNTER — Encounter: Payer: Self-pay | Admitting: Family Medicine

## 2016-12-06 ENCOUNTER — Ambulatory Visit (INDEPENDENT_AMBULATORY_CARE_PROVIDER_SITE_OTHER): Payer: BLUE CROSS/BLUE SHIELD | Admitting: Family Medicine

## 2016-12-06 VITALS — BP 133/83 | HR 65 | Temp 98.7°F | Resp 18 | Ht 62.25 in | Wt 171.8 lb

## 2016-12-06 DIAGNOSIS — Z130 Encounter for screening for diseases of the blood and blood-forming organs and certain disorders involving the immune mechanism: Secondary | ICD-10-CM | POA: Diagnosis not present

## 2016-12-06 DIAGNOSIS — Z1389 Encounter for screening for other disorder: Secondary | ICD-10-CM | POA: Diagnosis not present

## 2016-12-06 DIAGNOSIS — Z0000 Encounter for general adult medical examination without abnormal findings: Secondary | ICD-10-CM

## 2016-12-06 DIAGNOSIS — Z1329 Encounter for screening for other suspected endocrine disorder: Secondary | ICD-10-CM | POA: Diagnosis not present

## 2016-12-06 DIAGNOSIS — Z136 Encounter for screening for cardiovascular disorders: Secondary | ICD-10-CM | POA: Diagnosis not present

## 2016-12-06 DIAGNOSIS — Z113 Encounter for screening for infections with a predominantly sexual mode of transmission: Secondary | ICD-10-CM

## 2016-12-06 DIAGNOSIS — E559 Vitamin D deficiency, unspecified: Secondary | ICD-10-CM

## 2016-12-06 DIAGNOSIS — Z1383 Encounter for screening for respiratory disorder NEC: Secondary | ICD-10-CM | POA: Diagnosis not present

## 2016-12-06 NOTE — Patient Instructions (Signed)
Insomnia Insomnia is a sleep disorder that makes it difficult to fall asleep or to stay asleep. Insomnia can cause tiredness (fatigue), low energy, difficulty concentrating, mood swings, and poor performance at work or school. There are three different ways to classify insomnia:  Difficulty falling asleep.  Difficulty staying asleep.  Waking up too early in the morning.  Any type of insomnia can be long-term (chronic) or short-term (acute). Both are common. Short-term insomnia usually lasts for three months or less. Chronic insomnia occurs at least three times a week for longer than three months. What are the causes? Insomnia may be caused by another condition, situation, or substance, such as:  Anxiety.  Certain medicines.  Gastroesophageal reflux disease (GERD) or other gastrointestinal conditions.  Asthma or other breathing conditions.  Restless legs syndrome, sleep apnea, or other sleep disorders.  Chronic pain.  Menopause. This may include hot flashes.  Stroke.  Abuse of alcohol, tobacco, or illegal drugs.  Depression.  Caffeine.  Neurological disorders, such as Alzheimer disease.  An overactive thyroid (hyperthyroidism).  The cause of insomnia may not be known. What increases the risk? Risk factors for insomnia include:  Gender. Women are more commonly affected than men.  Age. Insomnia is more common as you get older.  Stress. This may involve your professional or personal life.  Income. Insomnia is more common in people with lower income.  Lack of exercise.  Irregular work schedule or night shifts.  Traveling between different time zones.  What are the signs or symptoms? If you have insomnia, trouble falling asleep or trouble staying asleep is the main symptom. This may lead to other symptoms, such as:  Feeling fatigued.  Feeling nervous about going to sleep.  Not feeling rested in the morning.  Having trouble concentrating.  Feeling  irritable, anxious, or depressed.  How is this treated? Treatment for insomnia depends on the cause. If your insomnia is caused by an underlying condition, treatment will focus on addressing the condition. Treatment may also include:  Medicines to help you sleep.  Counseling or therapy.  Lifestyle adjustments.  Follow these instructions at home:  Take medicines only as directed by your health care provider.  Keep regular sleeping and waking hours. Avoid naps.  Keep a sleep diary to help you and your health care provider figure out what could be causing your insomnia. Include: ? When you sleep. ? When you wake up during the night. ? How well you sleep. ? How rested you feel the next day. ? Any side effects of medicines you are taking. ? What you eat and drink.  Make your bedroom a comfortable place where it is easy to fall asleep: ? Put up shades or special blackout curtains to block light from outside. ? Use a white noise machine to block noise. ? Keep the temperature cool.  Exercise regularly as directed by your health care provider. Avoid exercising right before bedtime.  Use relaxation techniques to manage stress. Ask your health care provider to suggest some techniques that may work well for you. These may include: ? Breathing exercises. ? Routines to release muscle tension. ? Visualizing peaceful scenes.  Cut back on alcohol, caffeinated beverages, and cigarettes, especially close to bedtime. These can disrupt your sleep.  Do not overeat or eat spicy foods right before bedtime. This can lead to digestive discomfort that can make it hard for you to sleep.  Limit screen use before bedtime. This includes: ? Watching TV. ? Using your smartphone, tablet, and   computer.  Stick to a routine. This can help you fall asleep faster. Try to do a quiet activity, brush your teeth, and go to bed at the same time each night.  Get out of bed if you are still awake after 15 minutes  of trying to sleep. Keep the lights down, but try reading or doing a quiet activity. When you feel sleepy, go back to bed.  Make sure that you drive carefully. Avoid driving if you feel very sleepy.  Keep all follow-up appointments as directed by your health care provider. This is important. Contact a health care provider if:  You are tired throughout the day or have trouble in your daily routine due to sleepiness.  You continue to have sleep problems or your sleep problems get worse. Get help right away if:  You have serious thoughts about hurting yourself or someone else. This information is not intended to replace advice given to you by your health care provider. Make sure you discuss any questions you have with your health care provider. Document Released: 04/06/2000 Document Revised: 09/09/2015 Document Reviewed: 01/08/2014 Elsevier Interactive Patient Education  2018 [place] protect organs, store calcium, and anchor muscles. Good health habits, such as eating nutritious foods and exercising regularly, are important for maintaining healthy bones. They can also help to prevent a condition that causes bones to lose density and become weak and brittle (osteoporosis). Why is bone mass important? Bone mass refers to the amount of bone tissue that you have. The higher your bone mass, the stronger your bones. An important step toward having healthy bones throughout life is to have strong and dense bones during childhood. A young adult who has a high bone mass is more likely to have a high bone mass later in life. Bone mass at its greatest it is called peak bone mass. A large decline in bone mass occurs in older adults. In women, it occurs about the time of menopause. During this time, it is important to practice good health habits, because if more bone is lost than what is replaced, the bones will become less healthy and more likely to break (fracture). If you find that  you have a low bone mass, you may be able to prevent osteoporosis or further bone loss by changing your diet and lifestyle. How can I find out if my bone mass is low? Bone mass can be measured with an X-ray test that is called a bone mineral density (BMD) test. This test is recommended for all women who are age 27 or older. It may also be recommended for men who are age 82 or older, or for people who are more likely to develop osteoporosis due to:  Having bones that break easily.  Having a long-term disease that weakens bones, such as kidney disease or rheumatoid arthritis.  Having menopause earlier than normal.  Taking medicine that weakens bones, such as steroids, thyroid hormones, or hormone treatment for breast cancer or prostate cancer.  Smoking.  Drinking three or more alcoholic drinks each day.  What are the nutritional recommendations for healthy bones? To have healthy bones, you need to get enough of the right minerals and vitamins. Most nutrition experts recommend getting these nutrients from the foods that you eat. Nutritional recommendations vary from person to person. Ask your health care provider what is healthy for you. Here are some general guidelines. Calcium Recommendations Calcium is the most important (essential) mineral for bone health. Most people can  get enough calcium from their diet, but supplements may be recommended for people who are at risk for osteoporosis. Good sources of calcium include:  Dairy products, such as low-fat or nonfat milk, cheese, and yogurt.  Dark green leafy vegetables, such as bok choy and broccoli.  Calcium-fortified foods, such as orange juice, cereal, bread, soy beverages, and tofu products.  Nuts, such as almonds.  Follow these recommended amounts for daily calcium intake:  Children, age 79?3: 700 mg.  Children, age 40?8: 1,000 mg.  Children, age 409?13: 1,300 mg.  Teens, age 400?18: 1,300 mg.  Adults, age 85?50: 1,000  mg.  Adults, age 74?70: ? Men: 1,000 mg. ? Women: 1,200 mg.  Adults, age 28 or older: 1,200 mg.  Pregnant and breastfeeding females: ? Teens: 1,300 mg. ? Adults: 1,000 mg.  Vitamin D Recommendations Vitamin D is the most essential vitamin for bone health. It helps the body to absorb calcium. Sunlight stimulates the skin to make vitamin D, so be sure to get enough sunlight. If you live in a cold climate or you do not get outside often, your health care provider may recommend that you take vitamin D supplements. Good sources of vitamin D in your diet include:  Egg yolks.  Saltwater fish.  Milk and cereal fortified with vitamin D.  Follow these recommended amounts for daily vitamin D intake:  Children and teens, age 57?18: 69 international units.  Adults, age 69 or younger: 400-800 international units.  Adults, age 56 or older: 800-1,000 international units.  Other Nutrients Other nutrients for bone health include:  Phosphorus. This mineral is found in meat, poultry, dairy foods, nuts, and legumes. The recommended daily intake for adult men and adult women is 700 mg.  Magnesium. This mineral is found in seeds, nuts, dark green vegetables, and legumes. The recommended daily intake for adult men is 400?420 mg. For adult women, it is 310?320 mg.  Vitamin K. This vitamin is found in green leafy vegetables. The recommended daily intake is 120 mg for adult men and 90 mg for adult women.  What type of physical activity is best for building and maintaining healthy bones? Weight-bearing and strength-building activities are important for building and maintaining peak bone mass. Weight-bearing activities cause muscles and bones to work against gravity. Strength-building activities increases muscle strength that supports bones. Weight-bearing and muscle-building activities include:  Walking and hiking.  Jogging and running.  Dancing.  Gym exercises.  Lifting weights.  Tennis and  racquetball.  Climbing stairs.  Aerobics.  Adults should get at least 30 minutes of moderate physical activity on most days. Children should get at least 60 minutes of moderate physical activity on most days. Ask your health care provide what type of exercise is best for you. Where can I find more information? For more information, check out the following websites:  [place]: YardHomes.se  [employer] of Health: http://www.niams.AnonymousEar.fr.asp  This information is not intended to replace advice given to you by your health care provider. Make sure you discuss any questions you have with your health care provider. Document Released: 06/30/2003 Document Revised: 10/28/2015 Document Reviewed: 04/14/2014 Elsevier Interactive Patient Education  2018 [place].  Calcium Intake Recommendations Calcium is a mineral that affects many functions in the body, including:  Blood clotting.  Blood vessel function.  Nerve impulse conduction.  Hormone secretion.  Muscle contraction.  Bone and teeth functions.  Most of your body's calcium supply is stored in your bones and teeth. When your calcium  stores are low, you may be at risk for low bone mass, bone loss, and bone fractures. Consuming enough calcium helps to grow healthy bones and teeth and to prevent breakdown over time. It is very important that you get enough calcium if you are:  A child undergoing rapid growth.  An adolescent girl.  A pre- or post-menopausal woman.  A woman whose menstrual cycle has stopped due to anorexia nervosa or regular intense exercise.  An individual with lactose intolerance or a milk allergy.  A vegetarian.  What is my plan? Try to consume the recommended amount of calcium daily based on your age. Depending on your overall health, your health care provider may recommend increased calcium intake.General  daily calcium intake recommendations by age are:  Birth to 6 months: 200 mg.  Infants 7 to 12 months: 260 mg.  Children 1 to 3 years: 700 mg.  Children 4 to 8 years: 1,000 mg.  Children 9 to 13 years: 1,300 mg.  Teens 14 to 18 years: 1,300 mg.  Adults 19 to 50 years: 1,000 mg.  Adult women 51 to 70 years: 1,200 mg.  Adult men 51 to 70 years: 1,000 mg.  Adults 71 years and older: 1,200 mg.  Pregnant and breastfeeding teens: 1,300 mg.  Pregnant and breastfeeding adults: 1,000 mg.  What do I need to know about calcium intake?  In order for the body to absorb calcium, it needs vitamin D. You can get vitamin D through: ? Direct exposure of the skin to sunlight. ? Foods, such as egg yolks, liver, saltwater fish, and fortified milk. ? Supplements.  Consuming too much calcium may cause: ? Constipation. ? Decreased absorption of iron and zinc. ? Kidney stones.  Calcium supplements may interact with certain medicines. Check with your health care provider before starting any calcium supplements.  Try to get most of your calcium from food. What foods can I eat? Grains  Fortified oatmeal. Fortified ready-to-eat cereals. Fortified frozen waffles. Vegetables Turnip greens. Broccoli. Fruits Fortified orange juice. Meats and Other Protein Sources Canned sardines with bones. Canned salmon with bones. Soy beans. Tofu. Baked beans. Almonds. [place] nuts. Sunflower seeds. Dairy Milk. Yogurt. Cheese. Cottage cheese. Beverages Fortified soy milk. Fortified rice milk. Sweets/Desserts Pudding. Ice Cream. Milkshakes. Blackstrap molasses. The items listed above may not be a complete list of recommended foods or beverages. Contact your dietitian for more options. What foods can affect my calcium intake? It may be more difficult for your body to use calcium or calcium may leave your body more quickly if you consume large amounts of:  Sodium.  Protein.  Caffeine.  Alcohol.  This  information is not intended to replace advice given to you by your health care provider. Make sure you discuss any questions you have with your health care provider. Document Released: 11/22/2003 Document Revised: 10/28/2015 Document Reviewed: 09/15/2013 Elsevier Interactive Patient Education  2018 [employer].

## 2016-12-07 LAB — CBC WITH DIFFERENTIAL/PLATELET
BASOS: 0 %
Basophils Absolute: 0 10*3/uL (ref 0.0–0.2)
EOS (ABSOLUTE): 0 10*3/uL (ref 0.0–0.4)
EOS: 1 %
HEMOGLOBIN: 10.9 g/dL — AB (ref 11.1–15.9)
Hematocrit: 34.3 % (ref 34.0–46.6)
IMMATURE GRANS (ABS): 0 10*3/uL (ref 0.0–0.1)
Immature Granulocytes: 0 %
Lymphocytes Absolute: 2.1 10*3/uL (ref 0.7–3.1)
Lymphs: 37 %
MCH: 26.1 pg — AB (ref 26.6–33.0)
MCHC: 31.8 g/dL (ref 31.5–35.7)
MCV: 82 fL (ref 79–97)
Monocytes Absolute: 0.5 10*3/uL (ref 0.1–0.9)
Monocytes: 8 %
NEUTROS PCT: 54 %
Neutrophils Absolute: 3.1 10*3/uL (ref 1.4–7.0)
PLATELETS: 236 10*3/uL (ref 150–379)
RBC: 4.18 x10E6/uL (ref 3.77–5.28)
RDW: 14.2 % (ref 12.3–15.4)
WBC: 5.6 10*3/uL (ref 3.4–10.8)

## 2016-12-07 LAB — COMPREHENSIVE METABOLIC PANEL
ALBUMIN: 4.1 g/dL (ref 3.5–5.5)
ALT: 9 IU/L (ref 0–32)
AST: 14 IU/L (ref 0–40)
Albumin/Globulin Ratio: 1.5 (ref 1.2–2.2)
Alkaline Phosphatase: 45 IU/L (ref 39–117)
BUN / CREAT RATIO: 16 (ref 9–23)
BUN: 12 mg/dL (ref 6–24)
Bilirubin Total: 0.2 mg/dL (ref 0.0–1.2)
CALCIUM: 9 mg/dL (ref 8.7–10.2)
CO2: 23 mmol/L (ref 20–29)
CREATININE: 0.74 mg/dL (ref 0.57–1.00)
Chloride: 103 mmol/L (ref 96–106)
GFR calc Af Amer: 112 mL/min/{1.73_m2} (ref >59)
GFR, EST NON AFRICAN AMERICAN: 97 mL/min/{1.73_m2} (ref >59)
GLOBULIN, TOTAL: 2.7 g/dL (ref 1.5–4.5)
GLUCOSE: 93 mg/dL (ref 65–99)
Potassium: 4.4 mmol/L (ref 3.5–5.2)
SODIUM: 140 mmol/L (ref 134–144)
TOTAL PROTEIN: 6.8 g/dL (ref 6.0–8.5)

## 2016-12-07 LAB — LIPID PANEL
CHOL/HDL RATIO: 3.9 ratio (ref 0.0–4.4)
Cholesterol, Total: 193 mg/dL (ref 100–199)
HDL: 49 mg/dL (ref >39)
LDL CALC: 126 mg/dL — AB (ref 0–99)
Triglycerides: 88 mg/dL (ref 0–149)
VLDL CHOLESTEROL CAL: 18 mg/dL (ref 5–40)

## 2016-12-07 LAB — VITAMIN D 25 HYDROXY (VIT D DEFICIENCY, FRACTURES): Vit D, 25-Hydroxy: 24.6 ng/mL — ABNORMAL LOW (ref 30.0–100.0)

## 2016-12-07 LAB — HIV ANTIBODY (ROUTINE TESTING W REFLEX): HIV SCREEN 4TH GENERATION: NONREACTIVE

## 2016-12-07 LAB — TSH: TSH: 2.08 u[IU]/mL (ref 0.450–4.500)

## 2016-12-27 DIAGNOSIS — F418 Other specified anxiety disorders: Secondary | ICD-10-CM | POA: Diagnosis not present

## 2017-01-02 DIAGNOSIS — F418 Other specified anxiety disorders: Secondary | ICD-10-CM | POA: Diagnosis not present

## 2017-01-04 ENCOUNTER — Ambulatory Visit: Payer: BLUE CROSS/BLUE SHIELD

## 2017-01-09 DIAGNOSIS — F418 Other specified anxiety disorders: Secondary | ICD-10-CM | POA: Diagnosis not present

## 2017-01-18 ENCOUNTER — Ambulatory Visit
Admission: RE | Admit: 2017-01-18 | Discharge: 2017-01-18 | Disposition: A | Discharge status: Home / Self Care | Payer: BLUE CROSS/BLUE SHIELD | Source: Ambulatory Visit | Attending: Family Medicine | Admitting: Family Medicine

## 2017-01-18 DIAGNOSIS — Z1231 Encounter for screening mammogram for malignant neoplasm of breast: Secondary | ICD-10-CM | POA: Diagnosis not present

## 2017-01-28 DIAGNOSIS — Z23 Encounter for immunization: Secondary | ICD-10-CM | POA: Diagnosis not present

## 2017-01-28 DIAGNOSIS — F418 Other specified anxiety disorders: Secondary | ICD-10-CM | POA: Diagnosis not present

## 2017-02-05 ENCOUNTER — Other Ambulatory Visit (HOSPITAL_COMMUNITY)
Admission: RE | Admit: 2017-02-05 | Discharge: 2017-02-05 | Disposition: A | Discharge status: Home / Self Care | Payer: BLUE CROSS/BLUE SHIELD | Source: Ambulatory Visit | Attending: Obstetrics and Gynecology | Admitting: Obstetrics and Gynecology

## 2017-02-05 ENCOUNTER — Encounter: Payer: Self-pay | Admitting: Obstetrics and Gynecology

## 2017-02-05 ENCOUNTER — Ambulatory Visit (INDEPENDENT_AMBULATORY_CARE_PROVIDER_SITE_OTHER): Payer: BLUE CROSS/BLUE SHIELD | Admitting: Obstetrics and Gynecology

## 2017-02-05 VITALS — BP 118/70 | HR 72 | Resp 18 | Ht 61.25 in | Wt 167.0 lb

## 2017-02-05 DIAGNOSIS — Z862 Personal history of diseases of the blood and blood-forming organs and certain disorders involving the immune mechanism: Secondary | ICD-10-CM | POA: Diagnosis not present

## 2017-02-05 DIAGNOSIS — Z01419 Encounter for gynecological examination (general) (routine) without abnormal findings: Secondary | ICD-10-CM

## 2017-02-05 DIAGNOSIS — Z309 Encounter for contraceptive management, unspecified: Secondary | ICD-10-CM

## 2017-02-05 DIAGNOSIS — Z124 Encounter for screening for malignant neoplasm of cervix: Secondary | ICD-10-CM

## 2017-02-05 MED ORDER — ETONOGESTREL-ETHINYL ESTRADIOL 0.12-0.015 MG/24HR VA RING: 1.0000 | VAGINAL_RING | VAGINAL | 0 refills | Status: DC

## 2017-02-05 NOTE — Progress Notes (Signed)
46 y.o. G0P0000 SingleCaucasianF here for annual exam.   Period Cycle (Days): 28 Period Duration (Days): 5 days - has a lot of clots  Period Pattern: Regular Menstrual Flow: Heavy, Moderate Menstrual Control: Tampon Menstrual Control Change Freq (Hours): changes tampon every 3 hours  Dysmenorrhea: None  One day of her cycle is heavy, occasionally saturating a super tampon tampon in 3-5 hours. Has some small clots, can come around the tampon (and the tampon isn't saturated).  No heavier than it has been.  Recent lab work with her primary, hgb was 10.9, normal TSH She is a vegetarian and feels her anemia is diet related. She is also deficient in vit D, she is on supplements.  She is planning to be sexually active, hasn't been active in years.   Patient's last menstrual period was 01/16/2017.          Sexually active: No.  The current method of family planning is abstinence.    Exercising: Yes.    weights/ walking/ swimming Smoker:  no  Health Maintenance: Pap:  01-23-16 ASCUS NEG HR HPV 12-23-14 WNL 12-02-13 WNL History of abnormal Pap:  Yes 06/2009 ASCUS + HR HPV MMG:  01-18-17 WNL  Colonoscopy:  Never BMD:   Never TDaP:  09-12-12  Gardasil:  N/A   reports that she has never smoked. She has never used smokeless tobacco. She reports that she drinks alcohol. She reports that she does not use drugs. Rare ETOH. 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   Past Medical History:  Diagnosis Date  . Abnormal menstrual cycle 07/27/10  . Anemia   . Anxiety   . 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 Bladder cancer ([place])   . Cancer [facility] 2008   Bladder  . Candidal vaginitis 04/06/08  . Depression   . Dysrhythmia   . H/O rubella   . H/O varicella   . Heart murmur   . History of varicose veins   . Low iron   . Mitral valve prolapse   . Mitral valve prolapse 05/03/2015  . Palpitations 05/03/2015  Bladder cancer was at 49, no longer following up.   Past Surgical History:   Procedure Laterality Date  . BLADDER SURGERY  2008   cancer - no chemo, no urostomy  . CERVICAL BIOPSY  W/ LOOP ELECTRODE EXCISION    . COLPOSCOPY    . CYSTOSCOPY    . LEEP    . LEEP  11/24/2010   Procedure: LOOP ELECTROSURGICAL EXCISION PROCEDURE (LEEP);  Surgeon: Betsy Coder, MD;  Location: [facility];  Service: Gynecology;  Laterality: N/A;  . tubes in ear   1977  . WISDOM TOOTH EXTRACTION  1991    Current Outpatient Prescriptions  Medication Sig Dispense Refill  . VITAMIN D, ERGOCALCIFEROL, PO Take by mouth.     No current facility-administered medications for this visit.     Family History  Problem Relation Age of Onset  . Cancer Mother 42       Breast  . Hypertension Mother   . Breast cancer Mother 8  . Hyperlipidemia Father   . Arrhythmia Father        atrial fibrillation  . Cancer Maternal Grandmother 68       breast  . Breast cancer Maternal Grandmother   . Kidney disease Maternal Grandfather   . Cancer Paternal Grandfather 62       colon cancer; skin cancer  . Colon cancer Paternal Grandfather   .  Heart disease Paternal Grandfather   . Skin cancer Paternal Grandfather   . Dementia Paternal Grandmother 41    Review of Systems  Constitutional: Negative.   HENT: Negative.   Eyes: Negative.   Respiratory: Negative.   Cardiovascular: Negative.   Gastrointestinal: Negative.   Endocrine: Negative.   Genitourinary: Positive for menstrual problem.       White bumps in vaginal area Clots with menstrual cycles  Musculoskeletal: Negative.   Skin: Negative.   Allergic/Immunologic: Negative.   Neurological: Negative.   Psychiatric/Behavioral: Negative.     Exam:   BP 118/70 (BP Location: Right Arm, Patient Position: Sitting, Cuff Size: Normal)   Pulse 72   Resp 18   Ht 5' 1.25" (1.556 m)   Wt 167 lb (75.8 kg)   LMP 01/16/2017   BMI 31.30 kg/m   Weight change: @WEIGHTCHANGE @ Height:   Height: 5' 1.25" (155.6 cm)  Ht Readings from Last 3 Encounters:   02/05/17 5' 1.25" (1.556 m)  12/06/16 5' 2.25" (1.581 m)  12/05/15 5\' 1"  (1.549 m)    General appearance: alert, cooperative and appears stated age Head: Normocephalic, without obvious abnormality, atraumatic Neck: no adenopathy, supple, symmetrical, trachea midline and thyroid normal to inspection and palpation Lungs: clear to auscultation bilaterally Cardiovascular: regular rate and rhythm Breasts: normal appearance, no masses or tenderness Abdomen: soft, non-tender; non distended,  no masses,  no organomegaly Extremities: extremities normal, atraumatic, no cyanosis or edema Skin: Skin color, texture, turgor normal. No rashes or lesions Lymph nodes: Cervical, supraclavicular, and axillary nodes normal. No abnormal inguinal nodes palpated Neurologic: Grossly normal   Pelvic: External genitalia:  no lesions              Urethra:  normal appearing urethra with no masses, tenderness or lesions              Bartholins and Skenes: normal                 Vagina: normal appearing vagina with normal color and discharge, no lesions              Cervix: no lesions               Bimanual Exam:  Uterus:  normal size, contour, position, consistency, mobility, non-tender              Adnexa: no mass, fullness, tenderness               Rectovaginal: Confirms               Anus:  normal sphincter tone, no lesions  Chaperone was present for exam.  A:  Well Woman with normal exam  Contraception  Mild anemia, patient feels it is diet related and will f/u with Dr Brigitte Pulse.  P:   Pap with hpv  Discussed options, particularly OCP's and the mirena IUD. Discussed the risks of both, including the possibly increase in breast cancer  She desires a script for the nuvaring, information on the IUD given  If she starts the nuvaring, she will make a f/u appointment in 3 months.  Recommended using condoms if sexually active  Mammogram UTD  Discussed breast self exam  Discussed calcium and vit D  intake

## 2017-02-05 NOTE — Patient Instructions (Signed)
EXERCISE AND DIET:  We recommended that you start or continue a regular exercise program for good health. Regular exercise means any activity that makes your heart beat faster and makes you sweat.  We recommend exercising at least 30 minutes per day at least 3 days a week, preferably 4 or 5.  We also recommend a diet low in fat and sugar.  Inactivity, poor dietary choices and obesity can cause diabetes, heart attack, stroke, and kidney damage, among others.    ALCOHOL AND SMOKING:  Women should limit their alcohol intake to no more than 7 drinks/beers/glasses of wine (combined, not each!) per week. Moderation of alcohol intake to this level decreases your risk of breast cancer and liver damage. And of course, no recreational drugs are part of a healthy lifestyle.  And absolutely no smoking or even second hand smoke. Most people know smoking can cause heart and lung diseases, but did you know it also contributes to weakening of your bones? Aging of your skin?  Yellowing of your teeth and nails?  CALCIUM AND VITAMIN D:  Adequate intake of calcium and Vitamin D are recommended.  The recommendations for exact amounts of these supplements seem to change often, but generally speaking 600 mg of calcium (either carbonate or citrate) and 800 units of Vitamin D per day seems prudent. Certain women may benefit from higher intake of Vitamin D.  If you are among these women, your doctor will have told you during your visit.    PAP SMEARS:  Pap smears, to check for cervical cancer or precancers,  have traditionally been done yearly, although recent scientific advances have shown that most women can have pap smears less often.  However, every woman still should have a physical exam from her gynecologist every year. It will include a breast check, inspection of the vulva and vagina to check for abnormal growths or skin changes, a visual exam of the cervix, and then an exam to evaluate the size and shape of the uterus and  ovaries.  And after 47 years of age, a rectal exam is indicated to check for rectal cancers. We will also provide age appropriate advice regarding health maintenance, like when you should have certain vaccines, screening for sexually transmitted diseases, bone density testing, colonoscopy, mammograms, etc.   MAMMOGRAMS:  All women over 40 years old should have a yearly mammogram. Many facilities now offer a "3D" mammogram, which may cost around $50 extra out of pocket. If possible,  we recommend you accept the option to have the 3D mammogram performed.  It both reduces the number of women who will be called back for extra views which then turn out to be normal, and it is better than the routine mammogram at detecting truly abnormal areas.    COLONOSCOPY:  Colonoscopy to screen for colon cancer is recommended for all women at age 50.  We know, you hate the idea of the prep.  We agree, BUT, having colon cancer and not knowing it is worse!!  Colon cancer so often starts as a polyp that can be seen and removed at colonscopy, which can quite literally save your life!  And if your first colonoscopy is normal and you have no family history of colon cancer, most women don't have to have it again for 10 years.  Once every ten years, you can do something that may end up saving your life, right?  We will be happy to help you get it scheduled when you are ready.    Be sure to check your insurance coverage so you understand how much it will cost.  It may be covered as a preventative service at no cost, but you should check your particular policy.      Oral Contraception Information Oral contraceptive pills (OCPs) are medicines taken to prevent pregnancy. OCPs work by preventing the ovaries from releasing eggs. The hormones in OCPs also cause the cervical mucus to thicken, preventing the sperm from entering the uterus. The hormones also cause the uterine lining to become thin, not allowing a fertilized egg to attach to the  inside of the uterus. OCPs are highly effective when taken exactly as prescribed. However, OCPs do not prevent sexually transmitted diseases (STDs). Safe sex practices, such as using condoms along with the pill, can help prevent STDs. Before taking the pill, you may have a physical exam and Pap test. Your health care provider may order blood tests. The health care provider will make sure you are a good candidate for oral contraception. Discuss with your health care provider the possible side effects of the OCP you may be prescribed. When starting an OCP, it can take 2 to 3 months for the body to adjust to the changes in hormone levels in your body. Types of oral contraception  The combination pill-This pill contains estrogen and progestin (synthetic progesterone) hormones. The combination pill comes in 21-day, 28-day, or 91-day packs. Some types of combination pills are meant to be taken continuously (365-day pills). With 21-day packs, you do not take pills for 7 days after the last pill. With 28-day packs, the pill is taken every day. The last 7 pills are without hormones. Certain types of pills have more than 21 hormone-containing pills. With 91-day packs, the first 84 pills contain both hormones, and the last 7 pills contain no hormones or contain estrogen only.  The minipill-This pill contains the progesterone hormone only. The pill is taken every day continuously. It is very important to take the pill at the same time each day. The minipill comes in packs of 28 pills. All 28 pills contain the hormone. Advantages of oral contraceptive pills  Decreases premenstrual symptoms.  Treats menstrual period cramps.  Regulates the menstrual cycle.  Decreases a heavy menstrual flow.  May treatacne, depending on the type of pill.  Treats abnormal uterine bleeding.  Treats polycystic ovarian syndrome.  Treats endometriosis.  Can be used as emergency contraception. Things that can make oral  contraceptive pills less effective OCPs can be less effective if:  You forget to take the pill at the same time every day.  You have a stomach or intestinal disease that lessens the absorption of the pill.  You take OCPs with other medicines that make OCPs less effective, such as antibiotics, certain HIV medicines, and some seizure medicines.  You take expired OCPs.  You forget to restart the pill on day 7, when using the packs of 21 pills.  Risks associated with oral contraceptive pills Oral contraceptive pills can sometimes cause side effects, such as:  Headache.  Nausea.  Breast tenderness.  Irregular bleeding or spotting.  Combination pills are also associated with a small increased risk of:  Blood clots.  Heart attack.  Stroke.  This information is not intended to replace advice given to you by your health care provider. Make sure you discuss any questions you have with your health care provider. Document Released: 06/30/2002 Document Revised: 09/15/2015 Document Reviewed: 09/28/2012 Elsevier Interactive Patient Education  2018 Elsevier Inc.  

## 2017-02-06 ENCOUNTER — Telehealth: Payer: Self-pay

## 2017-02-06 NOTE — Telephone Encounter (Signed)
Please let the patient know that her insurance won't cover her to get the vaccination because she is over the age cut off of 7.

## 2017-02-06 NOTE — Telephone Encounter (Signed)
Visit Follow-Up Question  Message [id number]  From Susan Cordova To Salvadore Dom, MD Sent 02/05/2017 1:47 PM  Hi, Dr. Talbert Nan-  I forgot to ask you about the HPV vaccine. I read recently that it's now been approved for people up to age 47 and covers a number of different strains, so even if someone has been diagnosed with HPV, they may get additional protection. I'm 28 but I've only had 3 sexual partners (I don't know if that might be a factor?). Please advise when you get a chance.   Thanks!   Susan Cordova   Responsible Party   Pool - [facility] Message taken by Gwendlyn Deutscher, RN on 02/06/2017 3:27 PM  No actions have been taken on this message.   Routing to [place] for review.  Pap:  01-23-16 ASCUS NEG HR HPV 12-23-14 WNL 12-02-13 WNL History of abnormal Pap:  Yes 06/2009 ASCUS + HR HPV

## 2017-02-06 NOTE — Telephone Encounter (Signed)
Telephone encounter created to review with Dr.Jertson. 

## 2017-02-07 LAB — CYTOLOGY - PAP
DIAGNOSIS: NEGATIVE
HPV (WINDOPATH): NOT DETECTED

## 2017-02-07 NOTE — Telephone Encounter (Signed)
Left message to call Kaitlyn at [phone]. 

## 2017-02-07 NOTE — Telephone Encounter (Signed)
Patient returning call  Please call home number  

## 2017-02-07 NOTE — Telephone Encounter (Signed)
Spoke with patient. Advised of message as seen below from [place]. Patient verbalizes understanding. Has additional questions which have been forwarded to Thayer Ohm.

## 2017-02-07 NOTE — Telephone Encounter (Signed)
Per Thayer Ohm rx needs to be dispensed through local pharmacy for patient to bring to the office for injection. [facility] can dispense Gardasil 9. Will need and rx and will have to order. Patient will have to pay OOP cost. Will then be seen in office for injection administration with copay. Patient has been notified and would like to perform more research and return call.  Routing to provider for final review. Patient agreeable to disposition. Will close encounter.

## 2017-02-08 DIAGNOSIS — F418 Other specified anxiety disorders: Secondary | ICD-10-CM | POA: Diagnosis not present

## 2017-02-18 DIAGNOSIS — F418 Other specified anxiety disorders: Secondary | ICD-10-CM | POA: Diagnosis not present

## 2017-02-22 ENCOUNTER — Telehealth: Payer: Self-pay | Admitting: Obstetrics and Gynecology

## 2017-02-22 MED ORDER — FLUCONAZOLE 150 MG PO TABS: 150.0000 mg | ORAL_TABLET | Freq: Once | ORAL | 0 refills | Status: AC

## 2017-02-22 NOTE — Telephone Encounter (Signed)
Patient states that she has started having thick white vaginal discharge. Requesting rx for Diflucan. Rx for Diflucan 150 mg po now, repeat in 72 hours sent to pharmacy on file.  Notes recorded by Nicholes Rough, LPN on 50/93/2671 at 7:45 AM EDT Patient in 02 recall -eh ------  Notes recorded by Salvadore Dom, MD on 02/11/2017 at 5:20 PM EDT 02 recall The pap did have yeast. I sent a mychart message (see below) with instructions for her to call and speak with you if she had symptoms. If she calls c/o symptoms, please treat with diflucan 150 mg x 1, may repeat x 1 in 72 hours if needed.   Hi Susan Cordova, Your pap and hpv test were negative (normal). There was some yeast seen on the pap. Symptoms of yeast are itching and an increase in vaginal discharge. If you are having any symptoms of yeast, please call the office and speak with Margaretha Sheffield. She can send a prescription in for you. If you aren't having symptoms, you don't have to treat it. Please call the office with any questions or concerns. Sumner Boast  Routing to provider for final review. Patient agreeable to disposition. Will close encounter.

## 2017-02-22 NOTE — Telephone Encounter (Signed)
Patient called requesting a prescription based on her current results. She said Dr. Talbert Nan called her today and left a message to call back if she'd like the medication. Pharmacy on file confirmed.

## 2017-03-18 DIAGNOSIS — F418 Other specified anxiety disorders: Secondary | ICD-10-CM | POA: Diagnosis not present

## 2017-03-28 ENCOUNTER — Other Ambulatory Visit: Payer: Self-pay

## 2017-03-28 ENCOUNTER — Ambulatory Visit: Payer: BLUE CROSS/BLUE SHIELD | Admitting: Family Medicine

## 2017-03-28 ENCOUNTER — Encounter: Payer: Self-pay | Admitting: Family Medicine

## 2017-03-28 VITALS — BP 108/68 | HR 101 | Temp 98.2°F | Resp 16 | Ht 61.25 in | Wt 169.2 lb

## 2017-03-28 DIAGNOSIS — N3001 Acute cystitis with hematuria: Secondary | ICD-10-CM | POA: Diagnosis not present

## 2017-03-28 DIAGNOSIS — R350 Frequency of micturition: Secondary | ICD-10-CM | POA: Diagnosis not present

## 2017-03-28 LAB — POCT URINALYSIS DIP (MANUAL ENTRY)
BILIRUBIN UA: NEGATIVE mg/dL
GLUCOSE UA: NEGATIVE mg/dL
NITRITE UA: NEGATIVE
PH UA: 6 (ref 5.0–8.0)
Protein Ur, POC: >=300 mg/dL — AB
Urobilinogen, UA: 0.2 E.U./dL

## 2017-03-28 LAB — POC MICROSCOPIC URINALYSIS (UMFC): Mucus: ABSENT

## 2017-03-28 MED ORDER — CEPHALEXIN 500 MG PO CAPS: 500.0000 mg | ORAL_CAPSULE | Freq: Two times a day (BID) | ORAL | 0 refills | Status: DC

## 2017-03-28 MED ORDER — PHENAZOPYRIDINE HCL 200 MG PO TABS: 200.0000 mg | ORAL_TABLET | Freq: Three times a day (TID) | ORAL | 0 refills | Status: DC | PRN

## 2017-03-28 NOTE — Progress Notes (Signed)
Subjective:    Patient ID: Susan Cordova, female    DOB: September 22, 1970, 46 y.o.   MRN: [id number] Chief Complaint  Patient presents with  . Urinary Tract Infection    urinary frequency pain and burning x 2 days . fatigue, pain in left side of neck, fever and chills  x 2 days      HPI Resumed intercourse and 2d later started feeling achy, tried, left anterior neck pain around nodes.  Felt like she had the flu but then urinary frequency and dysuria sxs. No prior UTI. No flank pain, no n/v/abd pain. No URI sxs. A slight bit of vaginal d/c yesterday. Used condoms.   H/o bladder cancer presented as hematuria.  On nuvaring  Past Medical History:  Diagnosis Date  . Abnormal menstrual cycle 07/27/10  . Anemia   . Anxiety   . 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 Bladder cancer ([place])   . Cancer [facility] 2008   Bladder  . Candidal vaginitis 04/06/08  . Depression   . Dysrhythmia   . H/O rubella   . H/O varicella   . Heart murmur   . History of varicose veins   . Low iron   . Mitral valve prolapse   . Mitral valve prolapse 05/03/2015  . Palpitations 05/03/2015   Past Surgical History:  Procedure Laterality Date  . BLADDER SURGERY  2008   cancer - no chemo, no urostomy  . CERVICAL BIOPSY  W/ LOOP ELECTRODE EXCISION    . COLPOSCOPY    . CYSTOSCOPY    . LEEP    . LEEP  11/24/2010   Procedure: LOOP ELECTROSURGICAL EXCISION PROCEDURE (LEEP);  Surgeon: Betsy Coder, MD;  Location: [facility];  Service: Gynecology;  Laterality: N/A;  . tubes in ear   1977  . WISDOM TOOTH EXTRACTION  1991   Current Outpatient Medications on File Prior to Visit  Medication Sig Dispense Refill  . etonogestrel-ethinyl estradiol (NUVARING) 0.12-0.015 MG/24HR vaginal ring Place 1 each vaginally every 28 (twenty-eight) days. Insert vaginally and leave in place for 3 consecutive weeks, then remove for 1 week. 3 each 0  . VITAMIN D, ERGOCALCIFEROL, PO Take by mouth.     No current facility-administered  medications on file prior to visit.    No Known Allergies Family History  Problem Relation Age of Onset  . Cancer Mother 4       Breast  . Hypertension Mother   . Breast cancer Mother 44  . Hyperlipidemia Father   . Arrhythmia Father        atrial fibrillation  . Cancer Maternal Grandmother 71       breast  . Breast cancer Maternal Grandmother   . Kidney disease Maternal Grandfather   . Cancer Paternal Grandfather 49       colon cancer; skin cancer  . Colon cancer Paternal Grandfather   . Heart disease Paternal Grandfather   . Skin cancer Paternal Grandfather   . Dementia Paternal Grandmother 51   Social History   Socioeconomic History  . Marital status: Single    Spouse name: None  . Number of children: None  . Years of education: None  . Highest education level: None  Social Needs  . Financial resource strain: None  . Food insecurity - worry: None  . Food insecurity - inability: None  . Transportation needs - medical: None  . Transportation needs - non-medical: None  Occupational History  . Occupation: professor  Employer: [employer]  Tobacco Use  . Smoking status: Never Smoker  . Smokeless tobacco: Never Used  Substance and Sexual Activity  . Alcohol use: Yes    Comment: rarely  . Drug use: No  . Sexual activity: Not Currently    Partners: Male    Birth control/protection: Abstinence  Other Topics Concern  . None  Social History Narrative   Plays tennis and walks for exercise. May have 3-5 alcoholic drinks per year.            Epworth Sleepiness Scale = 2 (as of 05/03/2015)   Depression screen [facility] 2/9 03/28/2017 12/06/2016 12/05/2015 01/08/2014  Decreased Interest 0 0 0 0  Down, Depressed, Hopeless 0 0 0 0  PHQ - 2 Score 0 0 0 0     Review of Systems  Constitutional: Positive for activity change, appetite change, chills, diaphoresis, fatigue and fever. Negative for unexpected weight change.  Gastrointestinal: Positive for constipation.  Negative for abdominal pain, diarrhea, nausea and vomiting.  Genitourinary: Positive for decreased urine volume, dysuria, frequency, hematuria, urgency and vaginal discharge. Negative for enuresis, flank pain, genital sores, menstrual problem, pelvic pain, vaginal bleeding and vaginal pain.  Skin: Negative for color change and rash.  Hematological: Positive for adenopathy.  Psychiatric/Behavioral: Positive for sleep disturbance.   constipaiton    Objective:   Physical Exam  Constitutional: She is oriented to person, place, and time. She appears well-developed and well-nourished. No distress.  HENT:  Head: Normocephalic and atraumatic.  Right Ear: Tympanic membrane, external ear and ear canal normal.  Left Ear: Tympanic membrane, external ear and ear canal normal.  Nose: Nose normal.  Eyes: Conjunctivae are normal. No scleral icterus.  Neck: Normal range of motion. Neck supple. No thyromegaly present.  Cardiovascular: Normal rate, regular rhythm and intact distal pulses.  Murmur (2/6 LUSB) heard. Pulmonary/Chest: Effort normal and breath sounds normal. No respiratory distress.  Abdominal: Soft. Bowel sounds are normal. She exhibits no distension. There is no hepatosplenomegaly. There is no tenderness. There is no rebound, no guarding and no CVA tenderness.  Poss fullness in LLQ c/w stool  Musculoskeletal: She exhibits no edema.  Lymphadenopathy:    She has cervical adenopathy.       Left cervical: Posterior cervical adenopathy present.  Neurological: She is alert and oriented to person, place, and time.  Skin: Skin is warm and dry. She is not diaphoretic. No erythema.  Psychiatric: She has a normal mood and affect. Her behavior is normal.      BP 108/68   Pulse (!) 101   Temp 98.2 F (36.8 C)   Resp 16   Ht 5' 1.25" (1.556 m)   Wt 169 lb 3.2 oz (76.7 kg)   SpO2 98%   BMI 31.71 kg/m      Assessment & Plan:   1. Acute cystitis with hematuria   2. Urine frequency     Stop pyridium by Sat at latest. If sxs do not resolve by Sat or worsen at all, RTC for recheck Sat or Mon.  Would likely be good to recheck urine after sxs resolve - and has to recheck if clx neg - due to h/o bladder cancer.  Orders Placed This Encounter  Procedures  . Urine Culture  . POCT urinalysis dipstick  . POCT Microscopic Urinalysis ([facility])    Meds ordered this encounter  Medications  . cephALEXin (KEFLEX) 500 MG capsule    Sig: Take 1 capsule (500 mg total) by mouth 2 (two)  times daily.    Dispense:  14 capsule    Refill:  0  . phenazopyridine (PYRIDIUM) 200 MG tablet    Sig: Take 1 tablet (200 mg total) by mouth 3 (three) times daily as needed for pain.    Dispense:  10 tablet    Refill:  0      Delman Cheadle, M.D.  Primary Care at [facility] [address] [place], [place] [address] [phone] phone [phone] fax  03/28/17 8:33 AM

## 2017-03-28 NOTE — Patient Instructions (Addendum)
     IF you received an x-ray today, you will receive an invoice from [facility]. Please contact [facility] at [phone] with questions or concerns regarding your invoice.   IF you received labwork today, you will receive an invoice from [facility]. Please contact [facility] at [phone] with questions or concerns regarding your invoice.   Our billing staff will not be able to assist you with questions regarding bills from these companies.  You will be contacted with the lab results as soon as they are available. The fastest way to get your results is to activate your My Chart account. Instructions are located on the last page of this paperwork. If you have not heard from us regarding the results in 2 weeks, please contact this office.     Urinary Tract Infection, Adult A urinary tract infection (UTI) is an infection of any part of the urinary tract. The urinary tract includes the:  Kidneys.  Ureters.  Bladder.  Urethra.  These organs make, store, and get rid of pee (urine) in the body. Follow these instructions at home:  Take over-the-counter and prescription medicines only as told by your doctor.  If you were prescribed an antibiotic medicine, take it as told by your doctor. Do not stop taking the antibiotic even if you start to feel better.  Avoid the following drinks: ? Alcohol. ? Caffeine. ? Tea. ? Carbonated drinks.  Drink enough fluid to keep your pee clear or pale yellow.  Keep all follow-up visits as told by your doctor. This is important.  Make sure to: ? Empty your bladder often and completely. Do not to hold pee for long periods of time. ? Empty your bladder before and after sex. ? Wipe from front to back after a bowel movement if you are female. Use each tissue one time when you wipe. Contact a doctor if:  You have back pain.  You have a fever.  You feel sick to your stomach (nauseous).  You throw up (vomit).  Your symptoms do  not get better after 3 days.  Your symptoms go away and then come back. Get help right away if:  You have very bad back pain.  You have very bad lower belly (abdominal) pain.  You are throwing up and cannot keep down any medicines or water. This information is not intended to replace advice given to you by your health care provider. Make sure you discuss any questions you have with your health care provider. Document Released: 09/26/2007 Document Revised: 09/15/2015 Document Reviewed: 02/28/2015 Elsevier Interactive Patient Education  2018 Elsevier Inc.  

## 2017-03-29 LAB — URINE CULTURE: ORGANISM ID, BACTERIA: NO GROWTH

## 2017-04-03 ENCOUNTER — Encounter: Payer: Self-pay | Admitting: Family Medicine

## 2017-04-04 ENCOUNTER — Ambulatory Visit: Payer: BLUE CROSS/BLUE SHIELD | Admitting: Physician Assistant

## 2017-04-04 ENCOUNTER — Other Ambulatory Visit: Payer: Self-pay

## 2017-04-04 ENCOUNTER — Encounter: Payer: Self-pay | Admitting: Physician Assistant

## 2017-04-04 VITALS — BP 126/82 | HR 100 | Temp 98.9°F | Resp 18 | Ht 61.25 in | Wt 169.0 lb

## 2017-04-04 DIAGNOSIS — R319 Hematuria, unspecified: Secondary | ICD-10-CM | POA: Diagnosis not present

## 2017-04-04 DIAGNOSIS — R8271 Bacteriuria: Secondary | ICD-10-CM

## 2017-04-04 LAB — POC MICROSCOPIC URINALYSIS (UMFC): MUCUS RE: ABSENT

## 2017-04-04 LAB — POCT URINALYSIS DIP (MANUAL ENTRY)
Glucose, UA: NEGATIVE mg/dL
LEUKOCYTES UA: NEGATIVE
Nitrite, UA: NEGATIVE
Spec Grav, UA: 1.02 (ref 1.010–1.025)
Urobilinogen, UA: 0.2 E.U./dL
pH, UA: 5.5 (ref 5.0–8.0)

## 2017-04-04 NOTE — Patient Instructions (Signed)
     IF you received an x-ray today, you will receive an invoice from [facility]. Please contact [facility] at [phone] with questions or concerns regarding your invoice.   IF you received labwork today, you will receive an invoice from [facility]. Please contact [facility] at [phone] with questions or concerns regarding your invoice.   Our billing staff will not be able to assist you with questions regarding bills from these companies.  You will be contacted with the lab results as soon as they are available. The fastest way to get your results is to activate your My Chart account. Instructions are located on the last page of this paperwork. If you have not heard from us regarding the results in 2 weeks, please contact this office.     

## 2017-04-04 NOTE — Progress Notes (Signed)
Susan Cordova  MRN: [id number] DOB: [birth year]  Subjective:  Susan Cordova is a 46 y.o. female seen in office today for a chief complaint of follow-up on cystitis.  Patient initially seen on 03/28/17 by Dr. Brigitte Pulse.  She had just started having sexual intercourse and 2 days later had started feeling achy.  She then developed urinary frequency and dysuria.  UA showed large blood, proteinuria, and leukocytes.  Urine micro with too numerous to count WBCs and too numerous to count RBCs.  She was treated with Keflex and told to return for repeat UA to ensure hematuria had resolved.  Today, patient notes she completed her antibiotic course yesterday.  She is feeling " a1000 times better."  Notes her lymph node pain, myalgia, urinary frequency, and dysuria have all resolved.  However, she still notes she is having some discoloration to her urine.  She has not taken Pyridium since last week.  She denies abdominal pain, fever, chills, urinary frequency, urinary hesitancy, dysuria, vaginal discharge, nausea,  and vomiting.  She is sexually active.  Uses condoms.  Has recently been tested for STDs and so has partner.  Has history of bladder cancer 10 years ago.  Notes was presented with subtle hematuria.  Treated with bladder surgery.  She used to follow closely with urology but was told she did not have to follow-up anymore.  In terms of water consumption, that she notes she typically has about 40 ounces a day.  She is only had 16 ounces today.  LMP 03/08/17.  She is supposed to start her menstrual cycle tomorrow.  Has no other questions or complaints.  Review of Systems  HENT: Negative for sore throat.   Gastrointestinal: Negative for diarrhea.  Musculoskeletal: Negative for myalgias.  Neurological: Negative for dizziness and light-headedness.      Patient Active Problem List   Diagnosis Date Noted  . Palpitations 05/03/2015  . Vitamin D deficiency 09/18/2012  . CIN I (cervical intraepithelial neoplasia I)  10/05/2011  . Bilateral bunions 02/27/2011  . Gait abnormality 02/27/2011  . Right flank pain 01/12/2011  . SHOULDER PAIN, RIGHT 11/04/2007  . WRIST PAIN, RIGHT 11/04/2007  . FOOT PAIN, LEFT 11/04/2007  . ENTHESOPATHY, KNEE NEC 02/04/2007    Current Outpatient Medications on File Prior to Visit  Medication Sig Dispense Refill  . etonogestrel-ethinyl estradiol (NUVARING) 0.12-0.015 MG/24HR vaginal ring Place 1 each vaginally every 28 (twenty-eight) days. Insert vaginally and leave in place for 3 consecutive weeks, then remove for 1 week. 3 each 0  . cephALEXin (KEFLEX) 500 MG capsule Take 1 capsule (500 mg total) by mouth 2 (two) times daily. (Patient not taking: Reported on 04/04/2017) 14 capsule 0  . phenazopyridine (PYRIDIUM) 200 MG tablet Take 1 tablet (200 mg total) by mouth 3 (three) times daily as needed for pain. (Patient not taking: Reported on 04/04/2017) 10 tablet 0  . VITAMIN D, ERGOCALCIFEROL, PO Take by mouth.     No current facility-administered medications on file prior to visit.     No Known Allergies   Objective:  BP 126/82 (BP Location: Right Arm, Patient Position: Sitting, Cuff Size: Large)   Pulse 100   Temp 98.9 F (37.2 C) (Oral)   Resp 18   Ht 5' 1.25" (1.556 m)   Wt 169 lb (76.7 kg)   LMP 03/08/2017   SpO2 99%   BMI 31.67 kg/m   Physical Exam  Constitutional: She is oriented to person, place, and time and well-developed, well-nourished, and in no  distress.  HENT:  Head: Normocephalic and atraumatic.  Eyes: Conjunctivae are normal.  Neck: Normal range of motion.  Pulmonary/Chest: Effort normal.  Abdominal: Soft. Normal appearance. There is no tenderness.  Lymphadenopathy:       Head (right side): No submental, no submandibular, no tonsillar, no preauricular, no posterior auricular and no occipital adenopathy present.       Head (left side): No submental, no submandibular, no tonsillar, no preauricular, no posterior auricular and no occipital  adenopathy present.    She has cervical adenopathy.       Right cervical: No superficial cervical, no deep cervical and no posterior cervical adenopathy present.      Left cervical: Posterior cervical adenopathy present. No deep cervical adenopathy present.       Right: No supraclavicular adenopathy present.       Left: No supraclavicular adenopathy present.  Neurological: She is alert and oriented to person, place, and time. Gait normal.  Skin: Skin is warm and dry.  Psychiatric: Affect normal.  Vitals reviewed.   Results for orders placed or performed in visit on 04/04/17 (from the past 24 hour(s))  POCT urinalysis dipstick     Status: Abnormal   Collection Time: 04/04/17  4:32 PM  Result Value Ref Range   Color, UA straw (A) yellow   Clarity, UA clear clear   Glucose, UA negative negative mg/dL   Bilirubin, UA small (A) negative   Ketones, POC UA trace (5) (A) negative mg/dL   Spec Grav, UA 1.020 1.010 - 1.025   Blood, UA trace-lysed (A) negative   pH, UA 5.5 5.0 - 8.0   Protein Ur, POC trace (A) negative mg/dL   Urobilinogen, UA 0.2 0.2 or 1.0 E.U./dL   Nitrite, UA Negative Negative   Leukocytes, UA Negative Negative  POCT Microscopic Urinalysis ([facility])     Status: Abnormal   Collection Time: 04/04/17  5:08 PM  Result Value Ref Range   WBC,UR,HPF,POC None None WBC/hpf   RBC,UR,HPF,POC None None RBC/hpf   Bacteria Many (A) None, Too numerous to count   Mucus Absent Absent   Epithelial Cells, UR Per Microscopy Few (A) None, Too numerous to count cells/hpf     Assessment and Plan :   1. Hematuria, unspecified type Urine culture with no growth. UA today is improved from prior visit. No leukocytes, RBCs, or WBCs seen. However, there is many bacteria. Pt left before urine results were completed and before I could return to the room for discussion because she had to get to work. Called phone to discuss results and further plan. No answer. Would like to add GC/Chlamydia and wet  prep due to bacteria in urine but pt left before this could be discussed. Encouraged pt to contact our office back to discuss this with her.  - POCT urinalysis dipstick - POCT Microscopic Urinalysis ([facility]) 2. Bacteria in urine  Tenna Delaine PA-C  [facility] 04/04/2017 6:04 PM

## 2017-04-22 ENCOUNTER — Ambulatory Visit: Payer: BLUE CROSS/BLUE SHIELD | Admitting: Family Medicine

## 2017-04-22 DIAGNOSIS — Z8551 Personal history of malignant neoplasm of bladder: Secondary | ICD-10-CM | POA: Diagnosis not present

## 2017-04-22 DIAGNOSIS — R319 Hematuria, unspecified: Secondary | ICD-10-CM

## 2017-04-22 DIAGNOSIS — R8271 Bacteriuria: Secondary | ICD-10-CM | POA: Diagnosis not present

## 2017-04-22 DIAGNOSIS — R0683 Snoring: Secondary | ICD-10-CM

## 2017-04-22 LAB — POCT URINALYSIS DIP (MANUAL ENTRY)
BILIRUBIN UA: NEGATIVE
Glucose, UA: 250 mg/dL — AB
Ketones, POC UA: NEGATIVE mg/dL
LEUKOCYTES UA: NEGATIVE
NITRITE UA: NEGATIVE
PH UA: 5.5 (ref 5.0–8.0)
PROTEIN UA: NEGATIVE mg/dL
RBC UA: NEGATIVE
Spec Grav, UA: >=1.03 — AB (ref 1.010–1.025)
UROBILINOGEN UA: 0.2 U/dL

## 2017-04-22 LAB — POC MICROSCOPIC URINALYSIS (UMFC): Mucus: ABSENT

## 2017-04-22 NOTE — Progress Notes (Signed)
Subjective:    Patient ID: Susan Cordova, female    DOB: [birth year], 46 y.o.   MRN: [id number] Chief Complaint  Patient presents with  . 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 All sxs resolved within sev d of starting keflex 500 bid x 1 wk. Very atypical as urine culture negative 3 wks ago. However, due to distant h/o bladder cancer which had presented with gross hematuria and unable to conclusively attribute this hematuria to a UTI, I had her come in for recheck of urine 1 wks later - seen by my partner Timmothy Euler, [place] and was noted to have a significant worsening bacturia despite sx resolution after completing the antibiotics. The microscopic hemauturia had sig improved but not completely resolved so asked to come in again today. Completed menses 1 wk ago.  No systemic or urinary sxs. Is still sexually active (just initiated relationship 1 mo ago) and no problems, no dyspareunia. Is taking cranberry tabs 2-3 3 times a day still. No other new supp or otc meds    Normal pap/pelvic with gyn Dr. Talbert Nan 2 mos prior.  IUD happening likely.  Has not been sexually active x 9 yrs until last mo. She did have negative full STD testing AFTER ending with her prior partner 9 years ago. She has had numerous normal pap smears/pelvics in the interim. In monogamous relationship with current partner, on OCPs which she just started 2 mos ago. Current partner had complete STD testing immed prior to initiation of sexual activity - he was so rattled by her recent abnml urines that he showed her his actual lab results.  Saw cardiology Dr. Skeet Latch 05/03/2015 - 2 years ago - for MVP and sev yrs of daily palpitations who rec sleep study but was to pricey - $1800 - so deferred.  Would like referral back as now is feasible to proceed.  Would like to consider in-home sleep study instead of better covered by insurance/less out-of-pocket cost. Snoring and wakes self up snoring. Has recorded self and night and is quite severe. Seems to be deep - coming from her lower throat rather than nose or nasopharynx so has never tried breathe right strips, never seen dentist or ENT about snoring.  No reported apnea episodes. Wakes rested and no sig headaches.  Epworth sleepiness scale 04/2015 was 2 and pt does not think it has changed since that time - Not much fatigue during day.   No FHx of OSA     Past Medical History:  Diagnosis Date  . Abnormal menstrual cycle 07/27/10  . Anemia   . Anxiety   . 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 Bladder cancer ([place])   . Cancer [facility]) 2008   Bladder  . Candidal vaginitis 04/06/08  . Depression   . Dysrhythmia   . H/O rubella   . H/O varicella   . Heart murmur   . History of varicose veins   . Low iron   . Mitral valve prolapse   . Mitral valve prolapse 05/03/2015  . Palpitations 05/03/2015   Past  Surgical History:  Procedure Laterality Date  . BLADDER SURGERY  2008   cancer - no chemo, no urostomy  . CERVICAL BIOPSY  W/ LOOP ELECTRODE EXCISION    . COLPOSCOPY    . CYSTOSCOPY    . LEEP    . LEEP  11/24/2010   Procedure: LOOP ELECTROSURGICAL EXCISION PROCEDURE (LEEP);  Surgeon: Betsy Coder, MD;  Location: [facility];  Service: Gynecology;  Laterality: N/A;  . tubes in ear   1977  . WISDOM TOOTH EXTRACTION  1991   Current Outpatient Medications on File Prior to Visit  Medication Sig Dispense Refill  . etonogestrel-ethinyl estradiol (NUVARING) 0.12-0.015 MG/24HR vaginal ring Place 1 each vaginally every 28 (twenty-eight) days. Insert vaginally and leave in place for 3 consecutive weeks, then  remove for 1 week. 3 each 0   No current facility-administered medications on file prior to visit.    No Known Allergies Family History  Problem Relation Age of Onset  . Cancer Mother 42       Breast  . Hypertension Mother   . Breast cancer Mother 4  . Hyperlipidemia Father   . Arrhythmia Father        atrial fibrillation  . Cancer Maternal Grandmother 34       breast  . Breast cancer Maternal Grandmother   . Kidney disease Maternal Grandfather   . Cancer Paternal Grandfather 26       colon cancer; skin cancer  . Colon cancer Paternal Grandfather   . Heart disease Paternal Grandfather   . Skin cancer Paternal Grandfather   . Dementia Paternal Grandmother 35   Social History   Socioeconomic History  . Marital status: Single    Spouse name: Not on file  . Number of children: Not on file  . Years of education: Not on file  . Highest education level: Not on file  Social Needs  . Financial resource strain: Not on file  . Food insecurity - worry: Not on file  . Food insecurity - inability: Not on file  . Transportation needs - medical: Not on file  . Transportation needs - non-medical: Not on file  Occupational History  . Occupation: professor    Employer: [employer]  Tobacco Use  . Smoking status: Never Smoker  . Smokeless tobacco: Never Used  Substance and Sexual Activity  . Alcohol use: Yes    Comment: rarely  . Drug use: No  . Sexual activity: Not Currently    Partners: Male    Birth control/protection: Abstinence  Other Topics Concern  . Not on file  Social History Narrative   Plays tennis and walks for exercise. May have 3-5 alcoholic drinks per year.            Epworth Sleepiness Scale = 2 (as of 05/03/2015)   Depression screen [facility] 2/9 04/22/2017 04/04/2017 03/28/2017 12/06/2016 12/05/2015  Decreased Interest 0 0 0 0 0  Down, Depressed, Hopeless 0 0 0 0 0  PHQ - 2 Score 0 0 0 0 0    Review of Systems  Constitutional: Negative for activity  change, appetite change and fatigue.  Respiratory: Negative for apnea, chest tightness and shortness of breath.   Neurological: Negative for headaches.  Psychiatric/Behavioral: Positive for sleep disturbance.  See hpi    Objective:   Physical Exam  Constitutional: She is oriented to person, place, and time. She appears well-developed and well-nourished. No distress.  HENT:  Head: Normocephalic and atraumatic.  Right Ear: External ear normal.  Left Ear: External ear normal.  Eyes: Conjunctivae are normal. No scleral icterus.  Neck: Normal range of motion. Neck supple. No thyromegaly present.  Cardiovascular: Normal rate, regular rhythm, normal heart sounds and intact distal pulses.  Pulmonary/Chest: Effort normal and breath sounds normal. No respiratory distress.  Musculoskeletal: She exhibits no edema.  Lymphadenopathy:    She has no cervical adenopathy.  Neurological: She is alert and oriented to person, place, and time.  Skin: Skin is warm and dry. She is not diaphoretic. No erythema.  Psychiatric: She has a normal mood and affect. Her behavior is normal.      There were no vitals taken for this visit.  Results for orders placed or performed in visit on 04/22/17                  POCT urinalysis dipstick  Result Value Ref Range   Color, UA yellow yellow   Clarity, UA clear clear   Glucose, UA =250 (A) negative mg/dL   Bilirubin, UA negative negative   Ketones, POC UA negative negative mg/dL   Spec Grav, UA >=1.030 (A) 1.010 - 1.025   Blood, UA negative negative   pH, UA 5.5 5.0 - 8.0   Protein Ur, POC negative negative mg/dL   Urobilinogen, UA 0.2 0.2 or 1.0 E.U./dL   Nitrite, UA Negative Negative   Leukocytes, UA Negative Negative  POCT Microscopic Urinalysis ([facility])  Result Value Ref Range   WBC,UR,HPF,POC None None WBC/hpf   RBC,UR,HPF,POC None None RBC/hpf   Bacteria Many (A) None, Too numerous to count   Mucus Absent Absent   Epithelial Cells, UR Per  Microscopy Few (A) None, Too numerous to count cells/hpf       Assessment & Plan:   1. Hematuria, unspecified type - fully resolved today.  2. Bacteria in urine - clx, no sxs. Odd that only few bacteria were seen when she had UTI sxs and on recheck 1 wks and 3 wks later there is many despite being asymptomatic and seems to be relatively clean catch with few epithelial and nml dip so clx  3. History of bladder cancer - >5 yrs so cured but presented with hemautria so will be cautious and check urine cytology today  4. Snoring - referred for sleep study again per pt request, fortunately, does not seem to endorse any apnea sxs so may be better served by ENT or dental eval to address (Dr. Augustina Mood).  Should be fine to do in-home PNSG rather than at sleep center if sig cost savings.    Orders Placed This Encounter  Procedures  . Urine Culture  . Ambulatory referral to Sleep Studies    Referral Priority:   Routine    Referral Type:   Consultation    Referral Reason:   Specialty Services Required    Number of Visits Requested:   1  . POCT urinalysis dipstick  . POCT Microscopic Urinalysis ([facility])    Delman Cheadle, M.D.  [facility] at [facility] [address] [place], [place] [address] [phone] phone [phone] fax  04/23/17 10:51 PM

## 2017-04-22 NOTE — Patient Instructions (Addendum)
IF you received an x-ray today, you will receive an invoice from [facility]. Please contact [facility] at [phone] with questions or concerns regarding your invoice.   IF you received labwork today, you will receive an invoice from [facility]. Please contact [facility] at [phone] with questions or concerns regarding your invoice.   Our billing staff will not be able to assist you with questions regarding bills from these companies.  You will be contacted with the lab results as soon as they are available. The fastest way to get your results is to activate your My Chart account. Instructions are located on the last page of this paperwork. If you have not heard from [place] regarding the results in 2 weeks, please contact this office.    Culture and Sensitivity Testing Why am I having this test? A culture is a test that is performed by your health care provider to see if germs grow in a tissue sample or fluid sample that is taken from your body. Any germs that grow from the culture will be tested against a variety of medicines to find which medicine works best (sensitivity testing). For example, for an infection that is caused by bacteria, several types of antibiotic medicines may be tested. Your health care provider may perform this testing if you have symptoms of an infection. This type of testing can be done to help diagnose and treat the infection. Culture and sensitivity testing may help your health care provider to identify a targeted treatment so you can get better faster. What kind of sample is taken? A culture can be performed on a variety of samples from the body, such as:  Blood. Two samples are usually collected from two different sites on the body. These samples are collected by inserting a needle into a vein.  Urine. A urine sample is collected in a sterile container that is given to you by the lab.  Lung secretions (sputum). This is usually collected by  having you cough into a sterile container that is provided to you by the lab.  Wound and soft tissue. A sample is usually collected by swabbing the fluid that is coming from your wound.  How do I prepare for this test? Preparation for the test will vary depending upon the type of fluid or tissue that is being cultured. Follow the directions that are provided by your health care provider.  Blood. There is no preparation required for this test.  Urine. You will be required to collect a sample using either the clean-catch method or midstream collection method. Use the supplies that are provided by the lab, and carefully follow the instructions to collect the sample.  Lung sputum. You may be asked to not eat or drink anything prior to collecting a sputum sample. Rinse your mouth with water before collecting the sample to decrease contamination.  Wound and soft tissue. There is no preparation required for this test.  How are the results reported? Your test results will be reported as either positive or negative. It is your responsibility to obtain your test results. Ask the lab or department performing the test when and how you will get your results.  If enough bacteria grow from your culture, your test result is considered positive.  If many different bacteria grow from your culture, your test result may be reported as contaminated.  If no bacteria grow from your culture after 24-48 hours, your test result is considered negative.  A false-positive can occur if  a sample is contaminated. A false-positive result is incorrect because it indicates that an infection is present when it is not. What do the results mean? If the results are negative, this means:  It is unlikely that you have an infection.  Your test may be repeated if you still have symptoms.  If the results are positive, this means:  It is likely that you have an infection.  Results of sensitivity testing let your health care  provider know which medicines to use in treating your infection. Talk with your health care provider to discuss your results, treatment options, and if necessary, the need for more tests. Talk with your health care provider if you have any questions about your results. Talk with your health care provider to discuss your results, treatment options, and if necessary, the need for more tests. Talk with your health care provider if you have any questions about your results. This information is not intended to replace advice given to you by your health care provider. Make sure you discuss any questions you have with your health care provider. Document Released: 03/22/2008 Document Revised: 12/13/2015 Document Reviewed: 09/30/2013 Elsevier Interactive Patient Education  [employer].

## 2017-04-23 LAB — URINE CULTURE: Organism ID, Bacteria: NO GROWTH

## 2017-04-24 LAB — CYTOLOGY, URINE

## 2017-05-11 ENCOUNTER — Other Ambulatory Visit: Payer: Self-pay | Admitting: Obstetrics and Gynecology

## 2017-05-11 MED ORDER — ETONOGESTREL-ETHINYL ESTRADIOL 0.12-0.015 MG/24HR VA RING: 1.0000 | VAGINAL_RING | VAGINAL | 0 refills | Status: DC

## 2017-05-13 ENCOUNTER — Other Ambulatory Visit: Payer: Self-pay | Admitting: Obstetrics and Gynecology

## 2017-05-13 NOTE — Telephone Encounter (Signed)
Medication refill request: nuvaring  Last AEX:  02/05/17 JJ  Next AEX: not currently scheduled Last MMG (if hormonal medication request): 01/18/17 BIRADS 1 negative  Refill authorized: please advise. Spoke with pharmacy states they did not receive RX that was sent over weekend by Dr. Sabra Heck.

## 2017-05-13 NOTE — Telephone Encounter (Signed)
She needs a f/u appointment, please inform and set this up. One ring sent.

## 2017-05-13 NOTE — Telephone Encounter (Signed)
Message left to return call to Benedict Kue at [phone].    

## 2017-05-20 NOTE — Telephone Encounter (Signed)
Detailed message left per DPR for patient to return call to scheduled follow up appointment.

## 2017-05-20 NOTE — Telephone Encounter (Signed)
Patient returned call. Patient scheduled for follow up on Friday, 05/31/17 at 1345. Patient agreeable to date and time of appointment.   Routing to provider for final review. Patient agreeable to disposition. Will close encounter.

## 2017-05-27 ENCOUNTER — Ambulatory Visit: Payer: BLUE CROSS/BLUE SHIELD | Admitting: Neurology

## 2017-05-27 ENCOUNTER — Encounter: Payer: Self-pay | Admitting: Neurology

## 2017-05-27 VITALS — BP 148/78 | HR 62 | Ht 61.25 in | Wt 167.5 lb

## 2017-05-27 DIAGNOSIS — G478 Other sleep disorders: Secondary | ICD-10-CM | POA: Diagnosis not present

## 2017-05-27 DIAGNOSIS — R0683 Snoring: Secondary | ICD-10-CM

## 2017-05-27 DIAGNOSIS — E669 Obesity, unspecified: Secondary | ICD-10-CM

## 2017-05-27 DIAGNOSIS — R351 Nocturia: Secondary | ICD-10-CM | POA: Diagnosis not present

## 2017-05-27 NOTE — Progress Notes (Signed)
Subjective:    Patient ID: Susan Cordova is a 47 y.o. female.  HPI     Star Age, MD, PhD [facility] [address] [place], [place] [address]  Dear Dr. Brigitte Pulse,   I saw your patient, Susan Cordova, upon your kind request, in my neurologic clinic today for initial consultation of her sleep disorder, in particular, concern for underlying obstructive sleep apnea. The patient is unaccompanied today. As you know, Susan Cordova is a 47 year old right-handed woman with an underlying medical history of bladder cancer, UTI, mitral prolapse, palpitations, anxiety, depression, anemia and mild obesity, who reports snoring and difficulty maintaining sleep. I reviewed your office note from 04/22/2017. Her Epworth sleepiness score is 2 out of 24, fatigue score is 43 out of 63. 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 She is a nonsmoker and drinks alcohol infrequently, a few times a year on average, caffeine in the form of tea, 1-2 cups per day on average, avoiding caffeine after 4 PM. She denies AM HAs, has had nightly nocturia, about once or twice per average night, sometimes even 2 or 3 times per night. She sometimes wakes up from her own snoring. She has no family history of OSA. She denies symptoms of restless leg syndrome. She tries to keep a good bedtime routine. She does not take any medication to help her sleep. Going to sleep and not that difficult for her staying asleep is. Bedtime is generally between 9 and 9:30 during the school year, little later in the summer time, right time around 7, maybe 7:30 in the summertime. She has no children and no pets in the household. She does not have a TV in the bedroom.  Her Past Medical History Is Significant For: Past Medical History:  Diagnosis Date  . Abnormal menstrual cycle 07/27/10  . Anemia   . Anxiety   . 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 Bladder cancer ([place])   .  Cancer [facility]) 2008   Bladder  . Candidal vaginitis 04/06/08  . Depression   . Dysrhythmia   . H/O rubella   . H/O varicella   . Heart murmur   . History of varicose veins   . Low iron   . Mitral valve prolapse   . Mitral valve prolapse 05/03/2015  . Palpitations 05/03/2015    Her Past Surgical History Is Significant For: Past Surgical History:  Procedure Laterality Date  . BLADDER SURGERY  2008   cancer - no chemo, no urostomy  . CERVICAL BIOPSY  W/ LOOP ELECTRODE EXCISION    . COLPOSCOPY    . CYSTOSCOPY    . LEEP    . LEEP  11/24/2010   Procedure: LOOP ELECTROSURGICAL EXCISION PROCEDURE (LEEP);  Surgeon: Betsy Coder, MD;  Location: [facility];  Service: Gynecology;  Laterality: N/A;  . tubes in ear   1977  . [place]    Her Family History Is Significant For: Family History  Problem Relation Age of Onset  . Cancer Mother 36       Breast  . Hypertension Mother   . Breast cancer Mother 68  . Hyperlipidemia Father   . Arrhythmia Father        atrial fibrillation  . Cancer Maternal Grandmother 62       breast  . Breast cancer Maternal Grandmother   . Kidney disease Maternal Grandfather   . Cancer Paternal  Grandfather 50       colon cancer; skin cancer  . Colon cancer Paternal Grandfather   . Heart disease Paternal Grandfather   . Skin cancer Paternal Grandfather   . Dementia Paternal Grandmother 90    Her Social History Is Significant For: Social History   Socioeconomic History  . Marital status: Single    Spouse name: None  . Number of children: None  . Years of education: None  . Highest education level: None  Social Needs  . Financial resource strain: None  . Food insecurity - worry: None  . Food insecurity - inability: None  . Transportation needs - medical: None  . Transportation needs - non-medical: None  Occupational History  . Occupation: professor    Employer: [employer]  Tobacco Use  . Smoking status: Never Smoker  .  Smokeless tobacco: Never Used  Substance and Sexual Activity  . Alcohol use: Yes    Comment: rarely  . Drug use: No  . Sexual activity: Not Currently    Partners: Male    Birth control/protection: Abstinence  Other Topics Concern  . None  Social History Narrative   Plays tennis and walks for exercise. May have 3-5 alcoholic drinks per year.            Epworth Sleepiness Scale = 2 (as of 05/03/2015)    Her Allergies Are:  No Known Allergies:   Her Current Medications Are:  Outpatient Encounter Medications as of 05/27/2017  Medication Sig  . NUVARING 0.12-0.015 MG/24HR vaginal ring PLACE 1 EACH VAGINALLY EVERY 28 DAYS.   No facility-administered encounter medications on file as of 05/27/2017.   :  Review of Systems:  Out of a complete 14 point review of systems, all are reviewed and negative with the exception of these symptoms as listed below: Review of Systems  Neurological:       Patient reports that she has difficulty staying asleep and has been told that she snores.   Epworth Sleepiness Scale 0= would never doze 1= slight chance of dozing 2= moderate chance of dozing 3= high chance of dozing  Sitting and reading: 0 Watching TV: 0 Sitting inactive in a public place (ex. Theater or meeting): 0 As a passenger in a car for an hour without a break:1 Lying down to rest in the afternoon: 1 Sitting and talking to someone: 0 Sitting quietly after lunch (no alcohol): 0 In a car, while stopped in traffic: 0 Total: 2     Objective:  Neurological Exam  Physical Exam Physical Examination:   Vitals:   05/27/17 1541  BP: (!) 148/78  Pulse: 62   General Examination: The patient is a very pleasant 47 y.o. female in no acute distress. She appears well-developed and well-nourished and well groomed.   HEENT: Normocephalic, atraumatic, pupils are equal, round and reactive to light and accommodation. She has corrective eyeglasses. Hearing is grossly intact. Face is symmetric  with normal facial animation and normal facial sensation, tongue protrudes centrally and palate elevates symmetrically. Speech is clear, no hypophonia, no lip, neck or jaw tremor. Neck is supple. She has full range of motion. Airway examination reveals mild mouth dryness, adequate dental hygiene, smaller airway entry, tonsils are about 1+ bilaterally, uvula normal in size, nasal inspection reveals smaller nasal anatomy as well, no significant septal deviation.  Chest: Clear to auscultation without wheezing, rhonchi or crackles noted.  Heart: S1+S2+0, regular and normal without murmurs, rubs or gallops noted.   Abdomen: Soft, non-tender  and non-distended with normal bowel sounds appreciated on auscultation.  Extremities: There is no pitting edema in the distal lower extremities bilaterally. Pedal pulses are intact.  Skin: Warm and dry without trophic changes noted.  Musculoskeletal: exam reveals no obvious joint deformities, tenderness or joint swelling or erythema.   Neurologically:  Mental status: The patient is awake, alert and oriented in all 4 spheres. Her immediate and remote memory, attention, language skills and fund of knowledge are appropriate. There is no evidence of aphasia, agnosia, apraxia or anomia. Speech is clear with normal prosody and enunciation. Thought process is linear. Mood is normal and affect is normal.  Cranial nerves II - XII are as described above under HEENT exam. In addition: shoulder shrug is normal with equal shoulder height noted. Motor exam: Normal bulk, strength and tone is noted. There is no drift, tremor or rebound. Romberg is negative. Fine motor skills and coordination: grossly intact.  Cerebellar testing: No dysmetria or intention tremor on finger to nose testing. Heel to shin is unremarkable bilaterally. There is no truncal or gait ataxia.  Sensory exam: intact to light touch.  Gait, station and balance: She stands easily. No veering to one side is noted.  No leaning to one side is noted. Posture is age-appropriate and stance is narrow based. Gait shows normal stride length and normal pace. No problems turning are noted. Tandem walk is unremarkable.   Assessment and Plan:   In summary, Susan Cordova is a very pleasant 47 y.o.-year old female with an underlying medical history of bladder cancer, UTI, mitral prolapse, palpitations, anxiety, depression, anemia and mild obesity, whose history and physical exam are concerning for obstructive sleep apnea (OSA). I had a long chat with the patient about my findings and the diagnosis of OSA, its prognosis and treatment options. We talked about medical treatments, surgical interventions and non-pharmacological approaches. I explained in particular the risks and ramifications of untreated moderate to severe OSA, especially with respect to developing cardiovascular disease down the Road, including congestive heart failure, difficult to treat hypertension, cardiac arrhythmias, or stroke. Even type 2 diabetes has, in part, been linked to untreated OSA. Symptoms of untreated OSA include daytime sleepiness, memory problems, mood irritability and mood disorder such as depression and anxiety, lack of energy, as well as recurrent headaches, especially morning headaches. We talked about trying to maintain a healthy lifestyle in general, as well as the importance of weight control. I encouraged the patient to eat healthy, exercise daily and keep well hydrated, to keep a scheduled bedtime and wake time routine, to not skip any meals and eat healthy snacks in between meals. I advised the patient not to drive when feeling sleepy. I recommended the following at this time: sleep study with potential positive airway pressure titration. (We will score hypopneas at 3% and split the sleep study into diagnostic and treatment portion, if the estimated. 2 hour AHI is >20/h).   I explained the sleep test procedure to the patient and also  outlined possible surgical and non-surgical treatment options of OSA. I also explained the CPAP treatment option to the patient, who indicated that she would be willing to try CPAP if the need arises. I explained the importance of being compliant with PAP treatment, not only for insurance purposes but primarily to improve Her symptoms, and for the patient's long term health benefit, including to reduce Her cardiovascular risks. I answered all her questions today and the patient was in agreement. I would like to see her  back after the sleep study is completed and encouraged her to call with any interim questions, concerns, problems or updates.   Thank you very much for allowing me to participate in the care of this nice patient. If I can be of any further assistance to you please do not hesitate to call me at [phone].  Sincerely,   Star Age, MD, PhD

## 2017-05-27 NOTE — Patient Instructions (Addendum)

## 2017-05-31 ENCOUNTER — Encounter: Payer: Self-pay | Admitting: Obstetrics and Gynecology

## 2017-05-31 ENCOUNTER — Ambulatory Visit: Payer: BLUE CROSS/BLUE SHIELD | Admitting: Obstetrics and Gynecology

## 2017-05-31 ENCOUNTER — Other Ambulatory Visit: Payer: Self-pay

## 2017-05-31 VITALS — BP 112/70 | HR 64 | Resp 16 | Wt 167.0 lb

## 2017-05-31 DIAGNOSIS — D2262 Melanocytic nevi of left upper limb, including shoulder: Secondary | ICD-10-CM | POA: Diagnosis not present

## 2017-05-31 DIAGNOSIS — L738 Other specified follicular disorders: Secondary | ICD-10-CM | POA: Diagnosis not present

## 2017-05-31 DIAGNOSIS — D2239 Melanocytic nevi of other parts of face: Secondary | ICD-10-CM | POA: Diagnosis not present

## 2017-05-31 DIAGNOSIS — Z3044 Encounter for surveillance of vaginal ring hormonal contraceptive device: Secondary | ICD-10-CM | POA: Diagnosis not present

## 2017-05-31 DIAGNOSIS — N762 Acute vulvitis: Secondary | ICD-10-CM | POA: Diagnosis not present

## 2017-05-31 DIAGNOSIS — D2261 Melanocytic nevi of right upper limb, including shoulder: Secondary | ICD-10-CM | POA: Diagnosis not present

## 2017-05-31 MED ORDER — ETONOGESTREL-ETHINYL ESTRADIOL 0.12-0.015 MG/24HR VA RING: VAGINAL_RING | VAGINAL | 2 refills | Status: DC

## 2017-05-31 NOTE — Progress Notes (Signed)
GYNECOLOGY  VISIT   HPI: 47 y.o.   Single  Caucasian  female   [id number] with Patient's last menstrual period was 05/04/2017.   here for follow up on Nuvaring. She is doing well with the nuvaring, she is needing to take it out during sex. Cycles q month x 5 days. Saturating a super+ tampon in up to 2-3 hours. Less clots. No cramps. Patient is also c/o vaginal itching X 2 days, worse at night, overall mild. No discharge      GYNECOLOGIC HISTORY: Patient's last menstrual period was 05/04/2017. Contraception:Nuvaring  Menopausal hormone therapy: none         OB History    Gravida Para Term Preterm AB Living   0 0 0 0 0 0   SAB TAB Ectopic Multiple Live Births   0 0 0 0 0         Patient Active Problem List   Diagnosis Date Noted  . Palpitations 05/03/2015  . Vitamin D deficiency 09/18/2012  . CIN I (cervical intraepithelial neoplasia I) 10/05/2011  . Bilateral bunions 02/27/2011  . Gait abnormality 02/27/2011  . Right flank pain 01/12/2011  . SHOULDER PAIN, RIGHT 11/04/2007  . WRIST PAIN, RIGHT 11/04/2007  . FOOT PAIN, LEFT 11/04/2007  . ENTHESOPATHY, KNEE NEC 02/04/2007    Past Medical History:  Diagnosis Date  . Abnormal menstrual cycle 07/27/10  . Anemia   . Anxiety   . 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 Bladder cancer ([place])   . Cancer [facility]) 2008   Bladder  . Candidal vaginitis 04/06/08  . Depression   . Dysrhythmia   . H/O rubella   . H/O varicella   . Heart murmur   . History of varicose veins   . Low iron   . Mitral valve prolapse   . Mitral valve prolapse 05/03/2015  . Palpitations 05/03/2015    Past Surgical History:  Procedure Laterality Date  . BLADDER SURGERY  2008   cancer - no chemo, no urostomy  . CERVICAL BIOPSY  W/ LOOP ELECTRODE EXCISION    . COLPOSCOPY    . CYSTOSCOPY    . LEEP    . LEEP  11/24/2010   Procedure: LOOP ELECTROSURGICAL EXCISION PROCEDURE (LEEP);  Surgeon: Betsy Coder, MD;  Location: [facility];  Service: Gynecology;   Laterality: N/A;  . tubes in ear   1977  . WISDOM TOOTH EXTRACTION  1991    Current Outpatient Medications  Medication Sig Dispense Refill  . NUVARING 0.12-0.015 MG/24HR vaginal ring PLACE 1 EACH VAGINALLY EVERY 28 DAYS. 1 each 0   No current facility-administered medications for this visit.      ALLERGIES: Patient has no known allergies.  Family History  Problem Relation Age of Onset  . Cancer Mother 59       Breast  . Hypertension Mother   . Breast cancer Mother 63  . Hyperlipidemia Father   . Arrhythmia Father        atrial fibrillation  . Cancer Maternal Grandmother 29       breast  . Breast cancer Maternal Grandmother   . Kidney disease Maternal Grandfather   . Cancer Paternal Grandfather 75       colon cancer; skin cancer  . Colon cancer Paternal Grandfather   . Heart disease Paternal Grandfather   . Skin cancer Paternal Grandfather   . Dementia Paternal Grandmother 47    Social History   Socioeconomic History  . Marital status: Single  Spouse name: Not on file  . Number of children: Not on file  . Years of education: Not on file  . Highest education level: Not on file  Social Needs  . Financial resource strain: Not on file  . Food insecurity - worry: Not on file  . Food insecurity - inability: Not on file  . Transportation needs - medical: Not on file  . Transportation needs - non-medical: Not on file  Occupational History  . Occupation: professor    Employer: [employer]  Tobacco Use  . Smoking status: Never Smoker  . Smokeless tobacco: Never Used  Substance and Sexual Activity  . Alcohol use: Yes    Comment: rarely  . Drug use: No  . Sexual activity: Not Currently    Partners: Male    Birth control/protection: Abstinence  Other Topics Concern  . Not on file  Social History Narrative   Plays tennis and walks for exercise. May have 3-5 alcoholic drinks per year.            Epworth Sleepiness Scale = 2 (as of 05/03/2015)    Review of  Systems  Constitutional: Negative.   HENT: Negative.   Eyes: Negative.   Respiratory: Negative.   Cardiovascular: Negative.   Gastrointestinal: Negative.   Genitourinary:       Vaginal itching   Musculoskeletal: Negative.   Skin: Negative.   Neurological: Negative.   Endo/Heme/Allergies: Negative.   Psychiatric/Behavioral: Negative.     PHYSICAL EXAMINATION:    BP 112/70 (BP Location: Right Arm, Patient Position: Sitting, Cuff Size: Normal)   Pulse 64   Resp 16   Wt 167 lb (75.8 kg)   LMP 05/04/2017   BMI 31.30 kg/m     General appearance: alert, cooperative and appears stated age  Pelvic: External genitalia:  no lesions              Urethra:  normal appearing urethra with no masses, tenderness or lesions              Bartholins and Skenes: normal                 Vagina: normal appearing vagina with normal color and discharge, no lesions              Cervix: no lesion  Chaperone was present for exam.  ASSESSMENT Contraception surveillance, doing well on the nuvaring Mild vulvitis, normal exam, declines steroid ointment    PLAN Continue nuvaring Affirm sent Use vaseline externally as needed, further treatment depending on affirm results   An After Visit Summary was printed and given to the patient.  ~15 minutes face to face time of which over 50% was spent in counseling.

## 2017-06-01 LAB — VAGINITIS/VAGINOSIS, DNA PROBE
CANDIDA SPECIES: NEGATIVE
Gardnerella vaginalis: NEGATIVE
TRICHOMONAS VAG: NEGATIVE

## 2017-06-26 ENCOUNTER — Ambulatory Visit (INDEPENDENT_AMBULATORY_CARE_PROVIDER_SITE_OTHER): Payer: BLUE CROSS/BLUE SHIELD | Admitting: Neurology

## 2017-06-26 DIAGNOSIS — G478 Other sleep disorders: Secondary | ICD-10-CM

## 2017-06-26 DIAGNOSIS — R351 Nocturia: Secondary | ICD-10-CM

## 2017-06-26 DIAGNOSIS — R0683 Snoring: Secondary | ICD-10-CM

## 2017-06-26 DIAGNOSIS — E669 Obesity, unspecified: Secondary | ICD-10-CM

## 2017-06-26 DIAGNOSIS — G4733 Obstructive sleep apnea (adult) (pediatric): Secondary | ICD-10-CM

## 2017-07-01 ENCOUNTER — Telehealth: Payer: Self-pay

## 2017-07-01 ENCOUNTER — Other Ambulatory Visit: Payer: Self-pay | Admitting: Neurology

## 2017-07-01 DIAGNOSIS — G4719 Other hypersomnia: Secondary | ICD-10-CM

## 2017-07-01 DIAGNOSIS — G4733 Obstructive sleep apnea (adult) (pediatric): Secondary | ICD-10-CM

## 2017-07-01 NOTE — Telephone Encounter (Signed)
I called pt. I advised pt that Dr. Rexene Alberts reviewed their sleep study results and found that pt has severe sleep apnea in REM sleep, mild sleep apnea overall with an O2 nadir of 87% and recommends that pt be treated with a cpap. Dr. Rexene Alberts recommends that pt return for a repeat sleep study in order to properly titrate the cpap and ensure a good mask fit. Pt is agreeable to returning for a titration study. I advised pt that our sleep lab will file with pt's insurance and call pt to schedule the sleep study when we hear back from the pt's insurance regarding coverage of this sleep study. Pt verbalized understanding of results. Pt had no questions at this time but was encouraged to call back if questions arise.

## 2017-07-01 NOTE — Progress Notes (Signed)
Patient referred by Dr. Brigitte Pulse, seen by me on 2/4/219, diagnostic PSG on 06/26/17.   Please call and notify the patient that the recent sleep study showed overall mild obstructive sleep apnea, severe in REM sleep with a total AHI of 8.6/hour, REM AHI of 55.4/hour, and O2 nadir of 87%. The absence of supine sleep during this study likely underestimates her AHI and O2 nadir. She did have difficulty with sleep onset and sleep maintenance. I recommend treatment for her OSA in the form of CPAP. This will require a repeat sleep study for proper titration and mask fitting and correct monitoring of the oxygen saturations. Please explain to patient. I have placed an order in the chart. Thanks.  Susan Age, MD, PhD [facility])

## 2017-07-01 NOTE — Procedures (Signed)
PATIENT'S NAME:  Susan Cordova, Susan Cordova DOB:      03-02-1971      MR#:    [id number]     DATE OF RECORDING: 06/26/2017 REFERRING M.D.:  Shawnee Knapp, MD Study Performed:   Baseline Polysomnogram HISTORY: 47 year old woman with a history of bladder cancer, UTI, mitral prolapse, palpitations, anxiety, depression, anemia and mild obesity, who reports snoring and difficulty maintaining sleep. The patient endorsed the Epworth Sleepiness Scale at 2 points. The patient's weight 168 pounds with a height of 51 (inches), resulting in a BMI of 45. kg/m2. The patient's neck circumference measured 13.5 inches.  CURRENT MEDICATIONS: Nuvaring   PROCEDURE:  This is a multichannel digital polysomnogram utilizing the Somnostar 11.2 system.  Electrodes and sensors were applied and monitored per AASM Specifications.   EEG, EOG, Chin and Limb EMG, were sampled at 200 Hz.  ECG, Snore and Nasal Pressure, Thermal Airflow, Respiratory Effort, CPAP Flow and Pressure, Oximetry was sampled at 50 Hz. Digital video and audio were recorded.      BASELINE STUDY  Lights Out was at 21:09 and Lights On at 04:59.  Total recording time (TRT) was 471 minutes, with a total sleep time (TST) of  222 minutes.   The patient's sleep latency was 74.5 minutes, which is delayed. REM latency was 84.5 minutes, which is normal. The sleep efficiency was 47.1%, which is markedly reduced.     SLEEP ARCHITECTURE: WASO (Wake after sleep onset) was 183 minutes with moderate to severe sleep fragmentation noted and 3 longer periods of wakefulness. There were 43 minutes in Stage N1, 126 minutes Stage N2, 39 minutes Stage N3 and 14 minutes in Stage REM.  The percentage of Stage N1 was 19.4%, which is increased, Stage N2 was 56.8%, which is near normal, Stage N3 was 17.6%, which is normal, and Stage R (REM sleep) was 6.3%, which is markedly reduced. The arousals were noted as: 64 were spontaneous, 0 were associated with PLMs, 3 were associated with respiratory  events.  Audio and video analysis did not show any abnormal or unusual movements, behaviors, phonations or vocalizations. The patient took 1 bathroom break. Snoring was noted, ranging from soft to moderate and rarely loud. The EKG was in keeping with normal sinus rhythm (NSR).  RESPIRATORY ANALYSIS:  There were a total of 17 respiratory events:  4 obstructive apneas, 0 central apneas and 0 mixed apneas with a total of 1 apneas and an apnea index (AI) of 1.1/hour. There were 28 hypopneas with a hypopnea index of 7.6/hour. The patient also had 2 respiratory event related arousals (RERAs).      The total APNEA/HYPOPNEA INDEX (AHI) was 8.6/hour and the total RESPIRATORY DISTURBANCE INDEX was 5.1 /hour.  12 events occurred in REM sleep and 10 events in NREM. The REM AHI was 51.4 /hour, versus a non-REM AHI of 1.4. The patient spent 0 minutes of total sleep time in the supine position and 222 minutes in non-supine. The supine AHI was n/a versus a non-supine AHI of 8.6.  OXYGEN SATURATION & C02:  The Wake baseline 02 saturation was 98%, with the lowest being 87%. Time spent below 89% saturation equaled 0 minutes.  PERIODIC LIMB MOVEMENTS: The patient had a total of 0 Periodic Limb Movements.  The Periodic Limb Movement (PLM) index was 0 and the PLM Arousal index was 0/hour.  Post-study, the patient indicated that sleep was worse than usual.   IMPRESSION:  1. Obstructive Sleep Apnea (OSA) 2. Dysfunctions associated with sleep stages or  arousal from sleep  RECOMMENDATIONS:  1. This study demonstrates overall mild obstructive sleep apnea, severe in REM sleep with a total AHI of 8.6/hour, REM AHI of 55.4/hour, and O2 nadir of 87%. The absence of supine sleep during this study likely underestimates her AHI and O2 nadir. Given the patient's medical history and sleep related complaints, a full-night CPAP titration study is recommended to optimize therapy. Other treatment options may include avoidance of  supine sleep position along with weight loss, upper airway or jaw surgery in selected patients or the use of an oral appliance in certain patients. ENT evaluation and/or consultation with a maxillofacial surgeon or dentist may be feasible and some instances.  2. This study shows sleep fragmentation and abnormal sleep stage percentages; these are nonspecific findings and per se do not signify an intrinsic sleep disorder or a cause for the patient's sleep-related symptoms. Causes include (but are not limited to) the first night effect of the sleep study, circadian rhythm disturbances, medication effect or an underlying mood disorder or medical problem.  3. The patient should be cautioned not to drive, work at heights, or operate dangerous or heavy equipment when tired or sleepy. Review and reiteration of good sleep hygiene measures should be pursued with any patient. 4. The patient will be seen in follow-up by Dr. Rexene Alberts at [facility] for discussion of the test results and further management strategies. The referring provider will be notified of the test results.  I certify that I have reviewed the entire raw data recording prior to the issuance of this report in accordance with the Standards of Accreditation of the American Academy of Sleep Medicine (AASM)   Star Age, MD, PhD Diplomat, American Board of Psychiatry and Neurology (Neurology and Sleep Medicine)

## 2017-07-01 NOTE — Telephone Encounter (Signed)
-----   Message from Star Age, MD sent at 07/01/2017  2:05 PM EDT ----- Patient referred by Dr. Brigitte Pulse, seen by me on 2/4/219, diagnostic PSG on 06/26/17.   Please call and notify the patient that the recent sleep study showed overall mild obstructive sleep apnea, severe in REM sleep with a total AHI of 8.6/hour, REM AHI of 55.4/hour, and O2 nadir of 87%. The absence of supine sleep during this study likely underestimates her AHI and O2 nadir. She did have difficulty with sleep onset and sleep maintenance. I recommend treatment for her OSA in the form of CPAP. This will require a repeat sleep study for proper titration and mask fitting and correct monitoring of the oxygen saturations. Please explain to patient. I have placed an order in the chart. Thanks.  Star Age, MD, PhD [facility])

## 2017-08-01 ENCOUNTER — Ambulatory Visit (INDEPENDENT_AMBULATORY_CARE_PROVIDER_SITE_OTHER): Payer: BLUE CROSS/BLUE SHIELD | Admitting: Neurology

## 2017-08-01 DIAGNOSIS — G4733 Obstructive sleep apnea (adult) (pediatric): Secondary | ICD-10-CM

## 2017-08-01 DIAGNOSIS — G4720 Circadian rhythm sleep disorder, unspecified type: Secondary | ICD-10-CM

## 2017-08-01 DIAGNOSIS — G4719 Other hypersomnia: Secondary | ICD-10-CM

## 2017-08-12 ENCOUNTER — Encounter: Payer: Self-pay | Admitting: Neurology

## 2017-08-12 ENCOUNTER — Telehealth: Payer: Self-pay

## 2017-08-12 NOTE — Telephone Encounter (Signed)
I called pt. I advised pt that Dr. Rexene Alberts reviewed their sleep study results and found that pt did well with the cpap during her latest sleep study. Dr. Rexene Alberts recommends that pt start a cpap at home. I reviewed PAP compliance expectations with the pt. Pt is agreeable to starting a CPAP. I advised pt that an order will be sent to a DME, [facility], and [facility] will call the pt within about one week after they file with the pt's insurance. [facility] will show the pt how to use the machine, fit for masks, and troubleshoot the CPAP if needed. A follow up appt was made for insurance purposes with Dr. Rexene Alberts on 11/12/17 at 9:30am. Pt verbalized understanding to arrive 15 minutes early and bring their CPAP. A letter with all of this information in it will be sent to the pt's mychart account as a reminder. I verified with the pt that the address we have on file is correct. Pt verbalized understanding of results. Pt had no questions at this time but was encouraged to call back if questions arise.

## 2017-08-12 NOTE — Telephone Encounter (Signed)
-----   Message from Star Age, MD sent at 08/12/2017  3:33 PM EDT ----- Patient referred by Dr. Brigitte Pulse, seen by me on 2/4/219, diagnostic PSG on 06/26/17.  Patient had a CPAP titration study on 08/01/17.  Please call and inform patient that I have entered an order for treatment with positive airway pressure (PAP) treatment for obstructive sleep apnea (OSA). She did well during the latest sleep study with CPAP. We will, therefore, arrange for a machine for home use through a DME (durable medical equipment) company of Her choice; and I will see the patient back in follow-up in about 10 weeks. Please also explain to the patient that I will be looking out for compliance data, which can be downloaded from the machine (stored on an SD card, that is inserted in the machine) or via remote access through a modem, that is built into the machine. At the time of the followup appointment we will discuss sleep study results and how it is going with PAP treatment at home. Please advise patient to bring Her machine at the time of the first FU visit, even though this is cumbersome. Bringing the machine for every visit after that will likely not be needed, but often helps for the first visit to troubleshoot if needed. Please re-enforce the importance of compliance with treatment and the need for [place] to monitor compliance data - often an insurance requirement and actually good feedback for the patient as far as how they are doing.  Also remind patient, that any interim PAP machine or mask issues should be first addressed with the DME company, as they can often help better with technical and mask fit issues. Please ask if patient has a preference regarding DME company.  Please also make sure, the patient has a follow-up appointment with me in about 10 weeks from the setup date, thanks. May see one of our nurse practitioners if needed for proper timing of the FU appointment.  Please fax or rout report to the referring provider. Thanks,    Star Age, MD, PhD [facility])

## 2017-08-12 NOTE — Progress Notes (Signed)
Patient referred by Dr. Brigitte Pulse, seen by me on 2/4/219, diagnostic PSG on 06/26/17.  Patient had a CPAP titration study on 08/01/17.  Please call and inform patient that I have entered an order for treatment with positive airway pressure (PAP) treatment for obstructive sleep apnea (OSA). She did well during the latest sleep study with CPAP. We will, therefore, arrange for a machine for home use through a DME (durable medical equipment) company of Her choice; and I will see the patient back in follow-up in about 10 weeks. Please also explain to the patient that I will be looking out for compliance data, which can be downloaded from the machine (stored on an SD card, that is inserted in the machine) or via remote access through a modem, that is built into the machine. At the time of the followup appointment we will discuss sleep study results and how it is going with PAP treatment at home. Please advise patient to bring Her machine at the time of the first FU visit, even though this is cumbersome. Bringing the machine for every visit after that will likely not be needed, but often helps for the first visit to troubleshoot if needed. Please re-enforce the importance of compliance with treatment and the need for [place] to monitor compliance data - often an insurance requirement and actually good feedback for the patient as far as how they are doing.  Also remind patient, that any interim PAP machine or mask issues should be first addressed with the DME company, as they can often help better with technical and mask fit issues. Please ask if patient has a preference regarding DME company.  Please also make sure, the patient has a follow-up appointment with me in about 10 weeks from the setup date, thanks. May see one of our nurse practitioners if needed for proper timing of the FU appointment.  Please fax or rout report to the referring provider. Thanks,   Star Age, MD, PhD [facility])

## 2017-08-12 NOTE — Addendum Note (Signed)
Addended by: Star Age on: 08/12/2017 03:33 PM   Modules accepted: Orders

## 2017-08-12 NOTE — Procedures (Signed)
PATIENT'S NAME:  Susan Cordova, Susan Cordova DOB:      April 19, 1971      MR#:    [id number]     DATE OF RECORDING: 08/01/2017 REFERRING M.D.:  Shawnee Knapp, MD Study Performed:   CPAP  Titration HISTORY: 47 year old woman with a history of bladder cancer, UTI, mitral prolapse, palpitations, anxiety, depression, anemia and mild obesity, who returns for a CPAP titration study. Her baseline a PSG on 06/26/17 showed overall mild obstructive sleep apnea, severe in REM sleep with a total AHI of 8.6/hour, REM AHI of 55.4/hour, and O2 nadir of 87%. The patient endorsed the Epworth Sleepiness Scale at 2/24 points. The patient's weight 168 pounds with a height of 61 (inches), resulting in a BMI of 31.6 kg/m2. The patient's neck circumference measured 15 inches.  CURRENT MEDICATIONS: Nuvaring.   PROCEDURE:  This is a multichannel digital polysomnogram utilizing the SomnoStar 11.2 system.  Electrodes and sensors were applied and monitored per AASM Specifications.   EEG, EOG, Chin and Limb EMG, were sampled at 200 Hz.  ECG, Snore and Nasal Pressure, Thermal Airflow, Respiratory Effort, CPAP Flow and Pressure, Oximetry was sampled at 50 Hz. Digital video and audio were recorded.      The patient was fitted with a small N30 nasal mask. CPAP was initiated at 5.0 cmH20 with heated humidity per AASM standards and pressure was advanced to 9.0 cmH20 because of hypopneas, apneas and desaturations.  At a PAP pressure of 9 cmH20, there was a reduction of the AHI to 0.0/hour, with non-supine REM sleep achieved and O2 nadir of 94%.     Lights Out was at 21:19 and Lights On at 04:07. Total recording time (TRT) was 409 minutes, with a total sleep time (TST) of 262.5 minutes. The patient's sleep latency was 64.5 minutes, which is delayed. REM latency was 184.5 minutes, which is delayed. The sleep efficiency was 64.2 %, which is reduced.    SLEEP ARCHITECTURE: WASO (Wake after sleep onset) was 87 minutes with several longer periods of wakefulness.  There were 16 minutes in Stage N1, 116 minutes Stage N2, 86 minutes Stage N3 and 44.5 minutes in Stage REM.  The percentage of Stage N1 was 6.1%, Stage N2 was 44.2%, Stage N3 was 32.8%, which is increased, and Stage R (REM sleep) was 17.%, which is mildly reduced. The arousals were noted as: 21 were spontaneous, 0 were associated with PLMs, 1 were associated with respiratory events.  Audio and video analysis did not show any abnormal or unusual movements, behaviors, phonations or vocalizations. The patient took no bathroom breaks. The EKG was in keeping with normal sinus rhythm (NSR).  RESPIRATORY ANALYSIS:  There was a total of 4 respiratory events: 2 obstructive apneas, 2 central apneas and 0 mixed apneas with a total of 4 apneas and an apnea index (AI) of .9 /hour. There were 0 hypopneas with a hypopnea index of 0/hour. The patient also had 0 respiratory event related arousals (RERAs).      The total APNEA/HYPOPNEA INDEX  (AHI) was .9 /hour and the total RESPIRATORY DISTURBANCE INDEX was .9 .hour  3 events occurred in REM sleep and 1 events in NREM. The REM AHI was 4. /hour versus a non-REM AHI of .3 /hour.  The patient spent 95 minutes of total sleep time in the supine position and 168 minutes in non-supine. The supine AHI was 0.6, versus a non-supine AHI of 1.1.  OXYGEN SATURATION & C02:  The baseline 02 saturation was 98%, with the  lowest being 82%. Time spent below 89% saturation equaled 0 minutes.  PERIODIC LIMB MOVEMENTS: The patient had a total of 0 Periodic Limb Movements. The Periodic Limb Movement (PLM) index was 0 and the PLM Arousal index was 0 /hour.  Post-study, the patient indicated that sleep was better than usual.   IMPRESSION:   1. Obstructive Sleep Apnea (OSA) 2. Dysfunctions associated with sleep stages or arousal from sleep   RECOMMENDATIONS:   1. This study demonstrates resolution of the patient's obstructive sleep apnea with CPAP therapy. I will, therefore, start the  patient on home CPAP treatment at a pressure of 9 cm via small nasal mask with heated humidity. The patient should be reminded to be fully compliant with PAP therapy to improve sleep related symptoms and decrease long term cardiovascular risks. The patient should be reminded, that it may take up to 3 months to get fully used to using PAP with all planned sleep. The earlier full compliance is achieved, the better long term compliance tends to be. Please note that untreated obstructive sleep apnea may carry additional perioperative morbidity. Patients with significant obstructive sleep apnea should receive perioperative PAP therapy and the surgeons and particularly the anesthesiologist should be informed of the diagnosis and the severity of the sleep disordered breathing. 2. This study shows sleep fragmentation and abnormal sleep stage percentages; these are nonspecific findings and per se do not signify an intrinsic sleep disorder or a cause for the patient's sleep-related symptoms. Causes include (but are not limited to) the first night effect of the sleep study, circadian rhythm disturbances, medication effect or an underlying mood disorder or medical problem.  3. The patient should be cautioned not to drive, work at heights, or operate dangerous or heavy equipment when tired or sleepy. Review and reiteration of good sleep hygiene measures should be pursued with any patient. 4. The patient will be seen in follow-up by Dr. Rexene Alberts at [facility] for discussion of the test results and further management strategies. The referring provider will be notified of the test results.   I certify that I have reviewed the entire raw data recording prior to the issuance of this report in accordance with the Standards of Accreditation of the American Academy of Sleep Medicine (AASM)     Star Age, MD, PhD Diplomat, American Board of Psychiatry and Neurology (Neurology and Sleep Medicine)

## 2017-08-20 ENCOUNTER — Encounter: Payer: Self-pay | Admitting: Family Medicine

## 2017-08-20 DIAGNOSIS — Z23 Encounter for immunization: Secondary | ICD-10-CM

## 2017-08-22 ENCOUNTER — Ambulatory Visit (INDEPENDENT_AMBULATORY_CARE_PROVIDER_SITE_OTHER): Payer: BLUE CROSS/BLUE SHIELD | Admitting: Family Medicine

## 2017-08-22 DIAGNOSIS — Z23 Encounter for immunization: Secondary | ICD-10-CM | POA: Diagnosis not present

## 2017-08-23 LAB — RUBEOLA ANTIBODY IGG: RUBEOLA AB, IGG: >300 AU/mL (ref >29.9)

## 2017-09-17 DIAGNOSIS — G4733 Obstructive sleep apnea (adult) (pediatric): Secondary | ICD-10-CM | POA: Diagnosis not present

## 2017-09-20 NOTE — Progress Notes (Signed)
Lab only.not seen by provider 

## 2017-09-24 DIAGNOSIS — F418 Other specified anxiety disorders: Secondary | ICD-10-CM | POA: Diagnosis not present

## 2017-10-01 DIAGNOSIS — F418 Other specified anxiety disorders: Secondary | ICD-10-CM | POA: Diagnosis not present

## 2017-10-18 DIAGNOSIS — G4733 Obstructive sleep apnea (adult) (pediatric): Secondary | ICD-10-CM | POA: Diagnosis not present

## 2017-11-06 DIAGNOSIS — F418 Other specified anxiety disorders: Secondary | ICD-10-CM | POA: Diagnosis not present

## 2017-11-08 ENCOUNTER — Encounter: Payer: Self-pay | Admitting: Neurology

## 2017-11-10 ENCOUNTER — Encounter: Payer: Self-pay | Admitting: Neurology

## 2017-11-12 ENCOUNTER — Encounter: Payer: Self-pay | Admitting: Neurology

## 2017-11-12 ENCOUNTER — Ambulatory Visit: Payer: BLUE CROSS/BLUE SHIELD | Admitting: Neurology

## 2017-11-12 VITALS — BP 140/82 | HR 68 | Ht 61.0 in | Wt 171.3 lb

## 2017-11-12 DIAGNOSIS — G4733 Obstructive sleep apnea (adult) (pediatric): Secondary | ICD-10-CM | POA: Diagnosis not present

## 2017-11-12 DIAGNOSIS — Z9989 Dependence on other enabling machines and devices: Secondary | ICD-10-CM

## 2017-11-12 NOTE — Progress Notes (Signed)
Subjective:    Cordova ID: Susan Cordova is a 47 y.o. female.  HPI     Interim history:   Susan Cordova is a 47 year old right-handed woman with an underlying medical history of bladder cancer, UTI, mitral prolapse, palpitations, anxiety, depression, anemia and mild obesity, who since for follow-up consultation of Susan Cordova obstructive sleep apnea, after starting CPAP therapy, after sleep study testing. Susan Cordova is unaccompanied today. I first met Susan Cordova on 05/27/2017 at Susan request of Susan Cordova primary care physician, at which time Susan Cordova reported snoring and difficulty with sleep maintenance. I suggested we proceed with a sleep study. Susan Cordova had a baseline sleep study, followed by a CPAP titration study. Susan Cordova baseline sleep study from 06/26/2017 showed a sleep efficiency of only 47% with a prolonged sleep latency and increased wake after sleep onset with moderate to severe sleep fragmentation noted. Susan Cordova had a decreased percentage of REM sleep. Susan Cordova did not achieve any supine sleep. Total AHI was 8.6 per hour, REM AHI 51.4 per hour. Average oxygen saturation 98%, nadir was 87%. Susan Cordova had no significant PLMS. Susan Cordova was advised to pursue a full night CPAP titration study, which Susan Cordova had on 08/01/2017. Sleep efficiency was improved at 64.2%, sleep latency prolonged at 64.5 minutes, wake after sleep onset 87 minutes. Susan Cordova achieved an increased percentage of slow-wave sleep and REM sleep was mildly reduced at 17% or near normal. Susan Cordova had good results with CPAP of 9 cm. Susan Cordova had no significant PLMS. Susan Cordova indicated that Susan Cordova had slept a little better with CPAP therapy at night. Based on Susan Cordova test results I prescribed CPAP therapy for home use at a pressure of 9 cm.  Today, 11/12/2017: I reviewed Susan Cordova CPAP compliance data from 10/12/2017 through 11/10/2017, which is a total of 30 days, during which time Susan Cordova used Susan Cordova CPAP every night with percent used days greater than 4 hours at 100%, indicating superb compliance with an average usage of 9 hours  and 33 minutes, residual AHI at goal at 0.3 per hour, leak very low, pressure at 9 cm with EPR of 3. Susan Cordova reports doing better, sleep more consolidated, daytime tiredness a little better. R ear seems clogged. No other issues, but has a Hx of ear problems since childhood.    Susan Cordova's allergies, current medications, family history, past medical history, past social history, past surgical history and problem list were reviewed and updated as appropriate.   Previously:  05/27/2017: (Susan Cordova) reports snoring and difficulty maintaining sleep. I reviewed your office note from 04/22/2017. Susan Cordova Epworth sleepiness score is 2 out of 24, fatigue score is 43 out of 63. 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 Susan Cordova is a nonsmoker and drinks alcohol infrequently, a few times a year on average, caffeine in Susan form of tea, 1-2 cups per day on average, avoiding caffeine after 4 PM. Susan Cordova denies AM HAs, has had nightly nocturia, about once or twice per average night, sometimes even 2 or 3 times per night. Susan Cordova sometimes wakes up from Susan Cordova own snoring. Susan Cordova has no family history of OSA. Susan Cordova denies symptoms of restless leg syndrome. Susan Cordova tries to keep a good bedtime routine. Susan Cordova does not take any medication to help Susan Cordova sleep. Going to sleep and not that difficult for Susan Cordova staying asleep is. Bedtime is generally between 9 and 9:30 during Susan school year, little later in Susan summer time, right time around 7, maybe 7:30 in Susan summertime. Susan Cordova has no children and no  pets in Susan household. Susan Cordova does not have a TV in Susan bedroom.  Susan Cordova Past Medical History Is Significant For: Past Medical History:  Diagnosis Date  . Abnormal menstrual cycle 07/27/10  . Anemia   . Anxiety   . 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 Bladder cancer ([place])   . Cancer [facility]) 2008   Bladder  . Candidal vaginitis 04/06/08  . Depression   . Dysrhythmia   . H/O rubella   . H/O varicella   . Heart murmur   .  History of varicose veins   . Low iron   . Mitral valve prolapse   . Mitral valve prolapse 05/03/2015  . Palpitations 05/03/2015    Susan Cordova Past Surgical History Is Significant For: Past Surgical History:  Procedure Laterality Date  . BLADDER SURGERY  2008   cancer - no chemo, no urostomy  . CERVICAL BIOPSY  W/ LOOP ELECTRODE EXCISION    . COLPOSCOPY    . CYSTOSCOPY    . LEEP    . LEEP  11/24/2010   Procedure: LOOP ELECTROSURGICAL EXCISION PROCEDURE (LEEP);  Surgeon: Betsy Coder, MD;  Location: [facility];  Service: Gynecology;  Laterality: N/A;  . tubes in ear   1977  . [place]    Susan Cordova Family History Is Significant For: Family History  Problem Relation Age of Onset  . Cancer Mother 62       Breast  . Hypertension Mother   . Breast cancer Mother 51  . Hyperlipidemia Father   . Arrhythmia Father        atrial fibrillation  . Cancer Maternal Grandmother 65       breast  . Breast cancer Maternal Grandmother   . Kidney disease Maternal Grandfather   . Cancer Paternal Grandfather 71       colon cancer; skin cancer  . Colon cancer Paternal Grandfather   . Heart disease Paternal Grandfather   . Skin cancer Paternal Grandfather   . Dementia Paternal Grandmother 90    Susan Cordova Social History Is Significant For: Social History   Socioeconomic History  . Marital status: Single    Spouse name: Not on file  . Number of children: Not on file  . Years of education: Not on file  . Highest education level: Not on file  Occupational History  . Occupation: professor    Employer: [employer]  . Financial resource strain: Not on file  . Food insecurity:    Worry: Not on file    Inability: Not on file  . Transportation needs:    Medical: Not on file    Non-medical: Not on file  Tobacco Use  . Smoking status: Never Smoker  . Smokeless tobacco: Never Used  Substance and Sexual Activity  . Alcohol use: Yes    Comment: rarely  . Drug use: No  .  Sexual activity: Not Currently    Partners: Male    Birth control/protection: Abstinence  Lifestyle  . Physical activity:    Days per week: Not on file    Minutes per session: Not on file  . Stress: Not on file  Relationships  . Social connections:    Talks on phone: Not on file    Gets together: Not on file    Attends religious service: Not on file    Active member of club or organization: Not on file    Attends meetings of clubs or organizations: Not on  file    Relationship status: Not on file  Other Topics Concern  . Not on file  Social History Narrative   Plays tennis and walks for exercise. May have 3-5 alcoholic drinks per year.            Epworth Sleepiness Scale = 2 (as of 05/03/2015)    Susan Cordova Allergies Are:  No Known Allergies:   Susan Cordova Current Medications Are:  Outpatient Encounter Medications as of 11/12/2017  Medication Sig  . etonogestrel-ethinyl estradiol (NUVARING) 0.12-0.015 MG/24HR vaginal ring PLACE 1 EACH VAGINALLY EVERY 28 DAYS. (Cordova not taking: Reported on 11/12/2017)   No facility-administered encounter medications on file as of 11/12/2017.   :  Review of Systems:  Out of a complete 14 point review of systems, all are reviewed and negative with Susan exception of these symptoms as listed below: Review of Systems  Neurological:       Cordova returning to discuss new cpap start. Cordova reported a small amount of improvement in Susan Cordova symptoms.     Objective:  Neurological Exam  Physical Exam Physical Examination:   Vitals:   11/12/17 0916  BP: 140/82  Pulse: 68    General Examination: Susan Cordova is a very pleasant 47 y.o. female in no acute distress. Susan Cordova appears well-developed and well-nourished and well groomed.   HEENT: Normocephalic, atraumatic, pupils are equal, round and reactive to light and accommodation. Susan Cordova has corrective eyeglasses. Tympanic membranes are obscured bilaterally with cerumen, cerumen seems to impact Susan Cordova ear canal on Susan  right more than left. Hearing is grossly intact. Face is symmetric with normal facial animation and normal facial sensation, tongue protrudes centrally and palate elevates symmetrically. Speech is clear, no hypophonia, no lip, neck or jaw tremor. Neck is supple. Susan Cordova has full range of motion. Airway examination reveals mild mouth dryness, adequate dental hygiene, smaller airway entry, tonsils are about 1+ bilaterally, uvula normal in size.  Chest: Clear to auscultation without wheezing, rhonchi or crackles noted.  Heart: S1+S2+0, regular and normal without murmurs, rubs or gallops noted.   Abdomen: Soft, non-tender and non-distended.  Extremities: There is no pitting edema in Susan distal lower extremities bilaterally.   Skin: Warm and dry without trophic changes noted.  Musculoskeletal: exam reveals no obvious joint deformities, tenderness or joint swelling or erythema.   Neurologically:  Mental status: Susan Cordova is awake, alert and oriented in all 4 spheres. Susan Cordova immediate and remote memory, attention, language skills and fund of knowledge are appropriate. There is no evidence of aphasia, agnosia, apraxia or anomia. Speech is clear with normal prosody and enunciation. Thought process is linear. Mood is normal and affect is normal.  Cranial nerves II - XII are as described above under HEENT exam.  Motor exam: Normal bulk, strength and tone is noted. There is no drift, tremor or rebound. Fine motor skills and coordination: grossly intact.  Cerebellar testing: No dysmetria or intention tremor. There is no truncal or gait ataxia.  Sensory exam: intact to light touch.  Gait, station and balance: Susan Cordova stands easily. No veering to one side is noted. No leaning to one side is noted. Posture is age-appropriate and stance is narrow based. Gait shows normal stride length and normal pace. No problems turning are noted.   Assessment and Plan:   In summary, Susan Cordova is a very pleasant 46-year  old female with an underlying medical history of bladder cancer, UTI, mitral prolapse, palpitations, anxiety, depression, anemia and mild obesity, who presents for follow-up consultation  of Susan Cordova obstructive sleep apnea, after sleep study testing. Susan Cordova had a baseline sleep study on 06/26/2017 which showed overall mild obstructive sleep apnea, severe during REM sleep and absence of supine sleep. Susan Cordova had a CPAP titration study in April 2019 with good results on CPAP of 9 cm. Susan Cordova is fully compliant with CPAP therapy and I recommended for this. Susan Cordova has noticed modest improvement in Susan Cordova daytime somnolence and sleep consolidation. It is not unusual for Susan Cordova to sleep 9+ hours typically. Susan Cordova is encouraged to continue with CPAP therapy and pursue weight loss which may help reduce Susan Cordova sleep disordered breathing or even eliminate it possibly. Physical exam is stable. We talked about Susan Cordova sleep study results in detail today and also reviewed Susan Cordova compliance data together. I suggested a 6 month follow-up, Susan Cordova can see one of Susan nurse practitioners routinely. Susan Cordova can be seen yearly thereafter if Susan Cordova continues to do well. I answered all Susan Cordova questions today and Susan Cordova was in agreement.  I spent 30 minutes in total face-to-face time with Susan Cordova, more than 50% of which was spent in counseling and coordination of care, reviewing test results, reviewing medication and discussing or reviewing Susan diagnosis of OSA, its prognosis and treatment options. Pertinent laboratory and imaging test results that were available during this visit with Susan Cordova were reviewed by me and considered in my medical decision making (see chart for details).

## 2017-11-12 NOTE — Patient Instructions (Signed)
Please continue using your CPAP regularly. While your insurance requires that you use CPAP at least 4 hours each night on 70% of the nights, I recommend, that you not skip any nights and use it throughout the night if you can. Getting used to CPAP and staying with the treatment long term does take time and patience and discipline. Untreated obstructive sleep apnea when it is moderate to severe can have an adverse impact on cardiovascular health and raise her risk for heart disease, arrhythmias, hypertension, congestive heart failure, stroke and diabetes. Untreated obstructive sleep apnea causes sleep disruption, nonrestorative sleep, and sleep deprivation. This can have an impact on your day to day functioning and cause daytime sleepiness and impairment of cognitive function, memory loss, mood disturbance, and problems focussing. Using CPAP regularly can improve these symptoms.  Right ear canal has more wax, try the over the counter peroxide based ear drops.

## 2017-11-17 DIAGNOSIS — G4733 Obstructive sleep apnea (adult) (pediatric): Secondary | ICD-10-CM | POA: Diagnosis not present

## 2017-11-18 DIAGNOSIS — F418 Other specified anxiety disorders: Secondary | ICD-10-CM | POA: Diagnosis not present

## 2017-11-25 DIAGNOSIS — F418 Other specified anxiety disorders: Secondary | ICD-10-CM | POA: Diagnosis not present

## 2017-12-10 ENCOUNTER — Encounter: Payer: Self-pay | Admitting: Family Medicine

## 2017-12-10 DIAGNOSIS — F418 Other specified anxiety disorders: Secondary | ICD-10-CM | POA: Diagnosis not present

## 2017-12-16 ENCOUNTER — Encounter: Payer: Self-pay | Admitting: Family Medicine

## 2017-12-17 ENCOUNTER — Other Ambulatory Visit: Payer: Self-pay | Admitting: Family Medicine

## 2017-12-17 DIAGNOSIS — Z1231 Encounter for screening mammogram for malignant neoplasm of breast: Secondary | ICD-10-CM

## 2017-12-18 ENCOUNTER — Ambulatory Visit (INDEPENDENT_AMBULATORY_CARE_PROVIDER_SITE_OTHER): Payer: BLUE CROSS/BLUE SHIELD | Admitting: Family Medicine

## 2017-12-18 ENCOUNTER — Ambulatory Visit (INDEPENDENT_AMBULATORY_CARE_PROVIDER_SITE_OTHER): Payer: BLUE CROSS/BLUE SHIELD

## 2017-12-18 ENCOUNTER — Other Ambulatory Visit: Payer: Self-pay

## 2017-12-18 ENCOUNTER — Encounter: Payer: Self-pay | Admitting: Family Medicine

## 2017-12-18 VITALS — BP 132/82 | HR 62 | Temp 98.0°F | Resp 16 | Ht 62.8 in | Wt 169.6 lb

## 2017-12-18 DIAGNOSIS — H6123 Impacted cerumen, bilateral: Secondary | ICD-10-CM

## 2017-12-18 DIAGNOSIS — Z1389 Encounter for screening for other disorder: Secondary | ICD-10-CM

## 2017-12-18 DIAGNOSIS — Z0000 Encounter for general adult medical examination without abnormal findings: Secondary | ICD-10-CM | POA: Diagnosis not present

## 2017-12-18 DIAGNOSIS — Z113 Encounter for screening for infections with a predominantly sexual mode of transmission: Secondary | ICD-10-CM | POA: Diagnosis not present

## 2017-12-18 DIAGNOSIS — E559 Vitamin D deficiency, unspecified: Secondary | ICD-10-CM | POA: Diagnosis not present

## 2017-12-18 DIAGNOSIS — Z1383 Encounter for screening for respiratory disorder NEC: Secondary | ICD-10-CM

## 2017-12-18 DIAGNOSIS — Z136 Encounter for screening for cardiovascular disorders: Secondary | ICD-10-CM | POA: Diagnosis not present

## 2017-12-18 DIAGNOSIS — Z3009 Encounter for other general counseling and advice on contraception: Secondary | ICD-10-CM

## 2017-12-18 DIAGNOSIS — S99922A Unspecified injury of left foot, initial encounter: Secondary | ICD-10-CM

## 2017-12-18 DIAGNOSIS — E785 Hyperlipidemia, unspecified: Secondary | ICD-10-CM

## 2017-12-18 DIAGNOSIS — G4733 Obstructive sleep apnea (adult) (pediatric): Secondary | ICD-10-CM | POA: Diagnosis not present

## 2017-12-18 DIAGNOSIS — D500 Iron deficiency anemia secondary to blood loss (chronic): Secondary | ICD-10-CM | POA: Diagnosis not present

## 2017-12-18 DIAGNOSIS — D649 Anemia, unspecified: Secondary | ICD-10-CM | POA: Diagnosis not present

## 2017-12-18 LAB — POCT URINALYSIS DIP (MANUAL ENTRY)
BILIRUBIN UA: NEGATIVE mg/dL
Bilirubin, UA: NEGATIVE
Blood, UA: NEGATIVE
Glucose, UA: NEGATIVE mg/dL
LEUKOCYTES UA: NEGATIVE
NITRITE UA: NEGATIVE
PH UA: 7 (ref 5.0–8.0)
PROTEIN UA: NEGATIVE mg/dL
Spec Grav, UA: 1.015 (ref 1.010–1.025)
Urobilinogen, UA: 0.2 E.U./dL

## 2017-12-18 NOTE — Progress Notes (Signed)
Subjective:    Patient ID: Susan Cordova, female    DOB: April 06, 1971, 47 y.o.   MRN: [id number] Chief Complaint  Patient presents with  . Annual Exam    HPI Primary Preventative Screenings: Cervical Cancer: saw Dr. Sumner Boast at 02/05/17 - pap nml, HPV neg, + yeast but pt asymptomatic so not trxd Family Planning: currently abstinent with no forseeable plans to change that in the near future so stopped nuvaring (though happy to restart if the right guy came a long) STI screening: would like full baseline done today since has ended prior relationship - just get a clean bill Breast Cancer: sched for 01/21/18 Colorectal Cancer: NA due to age Tobacco use/EtOH/substances: soc EtOH Bone Density: NA due to age Cardiac: EKG 05/03/15 Weight/Blood sugar/Diet/Exercise: BMI Readings from Last 3 Encounters:  12/18/17 30.24 kg/m  11/12/17 32.37 kg/m  05/31/17 31.30 kg/m   Lab Results  Component Value Date   HGBA1C 5.2 09/12/2012   OTC/Vit/Supp/Herbal: Dentist/Optho: Immunizations: putting of flu shot till next mo Immunization History  Administered Date(s) Administered  . Hepatitis A 08/29/2012  . Influenza,inj,Quad PF,6+ Mos 01/08/2014  . Influenza-Unspecified 02/22/2016, 01/21/2017  . Tdap 09/12/2012  . 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 Never happened prior, no vag d/c, no other urinary sxs. Was not doing anything with increased abd pressures at the time, did not have to urinate, was not relaxed or thinking about something relaxing. Menses and bowels normal. Has been trying  to do kegels since - watched you tube videos and starting by trying to stop herself mid void.  H/o bladder cancer w/o any recurrence in > 5 yrs so cured.  Past Medical History:  Diagnosis Date  . Abnormal menstrual cycle 07/27/10  . Anemia   . Anxiety   . 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 Bladder cancer ([place])   . Cancer [facility]) 2008   Bladder  . Candidal vaginitis 04/06/08  . Depression   . Dysrhythmia   . H/O rubella   . H/O varicella   . Heart murmur   . History of varicose veins   . Low iron   . Mitral valve prolapse   . Mitral valve prolapse 05/03/2015  . Palpitations 05/03/2015   Past Surgical History:  Procedure Laterality Date  . BLADDER SURGERY  2008   cancer - no chemo, no urostomy  . CERVICAL BIOPSY  W/ LOOP ELECTRODE EXCISION    . COLPOSCOPY    . CYSTOSCOPY    . LEEP    . LEEP  11/24/2010   Procedure: LOOP ELECTROSURGICAL EXCISION PROCEDURE (LEEP);  Surgeon: Betsy Coder, MD;  Location: [facility];  Service: Gynecology;  Laterality: N/A;  . tubes in ear   1977  . WISDOM TOOTH EXTRACTION  1991   Current Outpatient Medications on File Prior to Visit  Medication Sig Dispense Refill  . etonogestrel-ethinyl estradiol (NUVARING) 0.12-0.015 MG/24HR vaginal ring PLACE 1 EACH VAGINALLY EVERY 28 DAYS. (Patient not taking: Reported on 12/18/2017) 3 each 2   No current facility-administered medications on  file prior to visit.    No Known Allergies Family History  Problem Relation Age of Onset  . Cancer Mother 12       Breast  . Hypertension Mother   . Breast cancer Mother 35  . Hyperlipidemia Father   . Arrhythmia Father        atrial fibrillation  . Cancer Maternal Grandmother 23       breast  . Breast cancer Maternal Grandmother   . Kidney disease Maternal Grandfather   . Cancer Paternal Grandfather 86       colon cancer; skin cancer  . Colon cancer Paternal Grandfather   . Heart disease Paternal Grandfather   . Skin cancer Paternal Grandfather   .  Dementia Paternal Grandmother 6   Social History   Socioeconomic History  . Marital status: Single    Spouse name: Not on file  . Number of children: Not on file  . Years of education: Not on file  . Highest education level: Not on file  Occupational History  . Occupation: professor    Employer: [employer]  . Financial resource strain: Not on file  . Food insecurity:    Worry: Not on file    Inability: Not on file  . Transportation needs:    Medical: Not on file    Non-medical: Not on file  Tobacco Use  . Smoking status: Never Smoker  . Smokeless tobacco: Never Used  Substance and Sexual Activity  . Alcohol use: Yes    Comment: rarely  . Drug use: No  . Sexual activity: Not Currently    Partners: Male    Birth control/protection: Abstinence  Lifestyle  . Physical activity:    Days per week: Not on file    Minutes per session: Not on file  . Stress: Not on file  Relationships  . Social connections:    Talks on phone: Not on file    Gets together: Not on file    Attends religious service: Not on file    Active member of club or organization: Not on file    Attends meetings of clubs or organizations: Not on file    Relationship status: Not on file  Other Topics Concern  . Not on file  Social History Narrative   Plays tennis and walks for exercise. May have 3-5 alcoholic drinks per year.            Epworth Sleepiness Scale = 2 (as of 05/03/2015)   Depression screen [facility] 2/9 12/18/2017 04/22/2017 04/04/2017 03/28/2017 12/06/2016  Decreased Interest 0 0 0 0 0  Down, Depressed, Hopeless 0 0 0 0 0  PHQ - 2 Score 0 0 0 0 0      Review of Systems  Constitutional: Positive for fatigue.  Genitourinary: Negative for decreased urine volume, dysuria, enuresis, frequency, hematuria, pelvic pain and vaginal discharge.  Musculoskeletal: Positive for arthralgias and gait problem.  Skin: Negative for rash and wound.   See hpi    Objective:   Physical  Exam  Constitutional: She is oriented to person, place, and time. She appears well-developed and well-nourished. No distress.  HENT:  Head: Normocephalic and atraumatic.  Right Ear: Tympanic membrane, external ear and ear canal normal.  Left Ear: Tympanic membrane, external ear and ear canal normal.  Nose: Nose normal. No mucosal edema or rhinorrhea.  Mouth/Throat: Uvula is midline, oropharynx is clear and moist and mucous membranes are normal. No posterior oropharyngeal erythema.  Eyes: Pupils  are equal, round, and reactive to light. Conjunctivae and EOM are normal. Right eye exhibits no discharge. Left eye exhibits no discharge. No scleral icterus.  Neck: Normal range of motion. Neck supple. No thyromegaly present.  Cardiovascular: Normal rate, regular rhythm, normal heart sounds and intact distal pulses.  Pulmonary/Chest: Effort normal and breath sounds normal. No respiratory distress. Right breast exhibits no inverted nipple, no mass, no nipple discharge, no skin change and no tenderness. Left breast exhibits no inverted nipple, no mass, no nipple discharge, no skin change and no tenderness. Breasts are symmetrical.  Abdominal: Soft. Bowel sounds are normal. There is no tenderness.  Musculoskeletal: She exhibits no edema.  Lymphadenopathy:    She has no cervical adenopathy.  Neurological: She is alert and oriented to person, place, and time. She has normal reflexes.  Skin: Skin is warm and dry. She is not diaphoretic. No erythema.  Psychiatric: She has a normal mood and affect. Her behavior is normal.    BP 132/82   Pulse 62   Temp 98 F (36.7 C) (Oral)   Resp 16   Ht 5' 2.8" (1.595 m)   Wt 169 lb 9.6 oz (76.9 kg)   LMP 12/06/2017 (Approximate)   SpO2 100%   BMI 30.24 kg/m   Visual Acuity Screening   Right eye Left eye Both eyes  Without correction:     With correction: 20/20 20/20 20/20    Dg Foot 2 Views Left  Result Date: 12/18/2017 CLINICAL DATA:  Initial evaluation for  acute point tenderness over distal second metatarsal status post recent trauma. EXAM: LEFT FOOT - 2 VIEW COMPARISON:  None. FINDINGS: There is no evidence of fracture or dislocation. There is no evidence of arthropathy or other focal bone abnormality. Soft tissues are unremarkable. IMPRESSION: No acute osseous abnormality about the left foot. Electronically Signed   By: Jeannine Boga M.D.   On: 12/18/2017 16:08       Assessment & Plan:  . 1. Annual physical exam   2. Screening for cardiovascular, respiratory, and genitourinary diseases   3. Family Planning -No forseeable need for contraception in the immediate future so stopped nuvaring for a while.  4. Vitamin D deficiency  - prior was ~25 - now down to 20 so start high dose once wkly K x 6 mos, then otc 1-2K qd  5. Hyperlipidemia LDL goal <160 - not fasting today, recheck next yr  6. Anemia, unspecified type - worsening since stopping hormonal therapy, check iron level Start iron supp x sev mos.  7. Routine screening for STI (sexually transmitted infection) - ended prior relationship, not currently sexually active so would like interim clean bill of health.   8. Injury of left foot, initial encounter - point tenderness over where chair hit foot - r/o stress fracture/bone chip since tenderness so focal over distal mid metatarsal - firm sole shoe like clog or hiking boot so no bend to allow push off from ball of foot/metatarsals - wear while up unti walking normally. Cont ice, elevate when poss, prn nsaids Check xray today  9. Bilateral impacted cerumen - s/p lavage by CMA    Orders Placed This Encounter  Procedures  . Chlamydia/GC NAA, Confirmation  . DG Foot 2 Views Left    Standing Status:   Future    Number of Occurrences:   1    Standing Expiration Date:   12/18/2018    Order Specific Question:   Reason for Exam (SYMPTOM  OR DIAGNOSIS REQUIRED)  Answer:   point tenderness over distal 2nd metatarsal after dropped chair last  week    Order Specific Question:   Is the patient pregnant?    Answer:   No    Order Specific Question:   Preferred imaging location?    Answer:   External  . VITAMIN D 25 Hydroxy (Vit-D Deficiency, Fractures)  . Lipid panel    Order Specific Question:   Has the patient fasted?    Answer:   Yes  . CBC with Differential/Platelet  . Comprehensive metabolic panel    Order Specific Question:   Has the patient fasted?    Answer:   Yes  . Iron, TIBC and Ferritin Panel  . HIV antibody  . RPR  . POCT urinalysis dipstick       Delman Cheadle, M.D.  Primary Care at [facility] [address] [place], [place] [address] [phone] phone [phone] fax  12/23/17 9:15 PM

## 2017-12-18 NOTE — Patient Instructions (Addendum)
If you have lab work done today you will be contacted with your lab results within the next 2 weeks.  If you have not heard from [place] then please contact us. The fastest way to get your results is to register for My Chart.   IF you received an x-ray today, you will receive an invoice from [facility]. Please contact [facility] at [phone] with questions or concerns regarding your invoice.   IF you received labwork today, you will receive an invoice from [facility]. Please contact [facility] at [phone] with questions or concerns regarding your invoice.   Our billing staff will not be able to assist you with questions regarding bills from these companies.  You will be contacted with the lab results as soon as they are available. The fastest way to get your results is to activate your My Chart account. Instructions are located on the last page of this paperwork. If you have not heard from [place] regarding the results in 2 weeks, please contact this office.     Health Maintenance, Female Adopting a healthy lifestyle and getting preventive care can go a long way to promote health and wellness. Talk with your health care provider about what schedule of regular examinations is right for you. This is a good chance for you to check in with your provider about disease prevention and staying healthy. In between checkups, there are plenty of things you can do on your own. Experts have done a lot of research about which lifestyle changes and preventive measures are most likely to keep you healthy. Ask your health care provider for more information. Weight and diet Eat a healthy diet  Be sure to include plenty of vegetables, fruits, low-fat dairy products, and lean protein.  Do not eat a lot of foods high in solid fats, added sugars, or salt.  Get regular exercise. This is one of the most important things you can do for your health. ? Most adults should exercise for at least 150  minutes each week. The exercise should increase your heart rate and make you sweat (moderate-intensity exercise). ? Most adults should also do strengthening exercises at least twice a week. This is in addition to the moderate-intensity exercise.  Maintain a healthy weight  Body mass index (BMI) is a measurement that can be used to identify possible weight problems. It estimates body fat based on height and weight. Your health care provider can help determine your BMI and help you achieve or maintain a healthy weight.  For females 61 years of age and older: ? A BMI below 18.5 is considered underweight. ? A BMI of 18.5 to 24.9 is normal. ? A BMI of 25 to 29.9 is considered overweight. ? A BMI of 30 and above is considered obese.  Watch levels of cholesterol and blood lipids  You should start having your blood tested for lipids and cholesterol at 48 years of age, then have this test every 5 years.  You may need to have your cholesterol levels checked more often if: ? Your lipid or cholesterol levels are high. ? You are older than 48 years of age. ? You are at high risk for heart disease.  Cancer screening Lung Cancer  Lung cancer screening is recommended for adults 56-66 years old who are at high risk for lung cancer because of a history of smoking.  A yearly low-dose CT scan of the lungs is recommended for people who: ? Currently smoke. ? Have quit  within the past 15 years. ? Have at least a 30-pack-year history of smoking. A pack year is smoking an average of one pack of cigarettes a day for 1 year.  Yearly screening should continue until it has been 15 years since you quit.  Yearly screening should stop if you develop a health problem that would prevent you from having lung cancer treatment.  Breast Cancer  Practice breast self-awareness. This means understanding how your breasts normally appear and feel.  It also means doing regular breast self-exams. Let your health care  provider know about any changes, no matter how small.  If you are in your 20s or 30s, you should have a clinical breast exam (CBE) by a health care provider every 1-3 years as part of a regular health exam.  If you are 24 or older, have a CBE every year. Also consider having a breast X-ray (mammogram) every year.  If you have a family history of breast cancer, talk to your health care provider about genetic screening.  If you are at high risk for breast cancer, talk to your health care provider about having an MRI and a mammogram every year.  Breast cancer gene (BRCA) assessment is recommended for women who have family members with BRCA-related cancers. BRCA-related cancers include: ? Breast. ? Ovarian. ? Tubal. ? Peritoneal cancers.  Results of the assessment will determine the need for genetic counseling and BRCA1 and BRCA2 testing.  Cervical Cancer Your health care provider may recommend that you be screened regularly for cancer of the pelvic organs (ovaries, uterus, and vagina). This screening involves a pelvic examination, including checking for microscopic changes to the surface of your cervix (Pap test). You may be encouraged to have this screening done every 3 years, beginning at age 52.  For women ages 34-65, health care providers may recommend pelvic exams and Pap testing every 3 years, or they may recommend the Pap and pelvic exam, combined with testing for human papilloma virus (HPV), every 5 years. Some types of HPV increase your risk of cervical cancer. Testing for HPV may also be done on women of any age with unclear Pap test results.  Other health care providers may not recommend any screening for nonpregnant women who are considered low risk for pelvic cancer and who do not have symptoms. Ask your health care provider if a screening pelvic exam is right for you.  If you have had past treatment for cervical cancer or a condition that could lead to cancer, you need Pap tests  and screening for cancer for at least 20 years after your treatment. If Pap tests have been discontinued, your risk factors (such as having a new sexual partner) need to be reassessed to determine if screening should resume. Some women have medical problems that increase the chance of getting cervical cancer. In these cases, your health care provider may recommend more frequent screening and Pap tests.  Colorectal Cancer  This type of cancer can be detected and often prevented.  Routine colorectal cancer screening usually begins at 48 years of age and continues through 48 years of age.  Your health care provider may recommend screening at an earlier age if you have risk factors for colon cancer.  Your health care provider may also recommend using home test kits to check for hidden blood in the stool.  A small camera at the end of a tube can be used to examine your colon directly (sigmoidoscopy or colonoscopy). This is done to check  for the earliest forms of colorectal cancer.  Routine screening usually begins at age 75.  Direct examination of the colon should be repeated every 5-10 years through 48 years of age. However, you may need to be screened more often if early forms of precancerous polyps or small growths are found.  Skin Cancer  Check your skin from head to toe regularly.  Tell your health care provider about any new moles or changes in moles, especially if there is a change in a mole's shape or color.  Also tell your health care provider if you have a mole that is larger than the size of a pencil eraser.  Always use sunscreen. Apply sunscreen liberally and repeatedly throughout the day.  Protect yourself by wearing long sleeves, pants, a wide-brimmed hat, and sunglasses whenever you are outside.  Heart disease, diabetes, and high blood pressure  High blood pressure causes heart disease and increases the risk of stroke. High blood pressure is more likely to develop  in: ? People who have blood pressure in the high end of the normal range (130-139/85-89 mm Hg). ? People who are overweight or obese. ? People who are African American.  If you are 41-67 years of age, have your blood pressure checked every 3-5 years. If you are 32 years of age or older, have your blood pressure checked every year. You should have your blood pressure measured twice-once when you are at a hospital or clinic, and once when you are not at a hospital or clinic. Record the average of the two measurements. To check your blood pressure when you are not at a hospital or clinic, you can use: ? An automated blood pressure machine at a pharmacy. ? A home blood pressure monitor.  If you are between 44 years and 70 years old, ask your health care provider if you should take aspirin to prevent strokes.  Have regular diabetes screenings. This involves taking a blood sample to check your fasting blood sugar level. ? If you are at a normal weight and have a low risk for diabetes, have this test once every three years after 48 years of age. ? If you are overweight and have a high risk for diabetes, consider being tested at a younger age or more often. Preventing infection Hepatitis B  If you have a higher risk for hepatitis B, you should be screened for this virus. You are considered at high risk for hepatitis B if: ? You were born in a country where hepatitis B is common. Ask your health care provider which countries are considered high risk. ? Your parents were born in a high-risk country, and you have not been immunized against hepatitis B (hepatitis B vaccine). ? You have HIV or AIDS. ? You use needles to inject street drugs. ? You live with someone who has hepatitis B. ? You have had sex with someone who has hepatitis B. ? You get hemodialysis treatment. ? You take certain medicines for conditions, including cancer, organ transplantation, and autoimmune conditions.  Hepatitis C  Blood  testing is recommended for: ? Everyone born from [birth year] through 1966. ? Anyone with known risk factors for hepatitis C.  Sexually transmitted infections (STIs)  You should be screened for sexually transmitted infections (STIs) including gonorrhea and chlamydia if: ? You are sexually active and are younger than 48 years of age. ? You are older than 48 years of age and your health care provider tells you that you are at risk for  this type of infection. ? Your sexual activity has changed since you were last screened and you are at an increased risk for chlamydia or gonorrhea. Ask your health care provider if you are at risk.  If you do not have HIV, but are at risk, it may be recommended that you take a prescription medicine daily to prevent HIV infection. This is called pre-exposure prophylaxis (PrEP). You are considered at risk if: ? You are sexually active and do not regularly use condoms or know the HIV status of your partner(s). ? You take drugs by injection. ? You are sexually active with a partner who has HIV.  Talk with your health care provider about whether you are at high risk of being infected with HIV. If you choose to begin PrEP, you should first be tested for HIV. You should then be tested every 3 months for as long as you are taking PrEP. Pregnancy  If you are premenopausal and you may become pregnant, ask your health care provider about preconception counseling.  If you may become pregnant, take 400 to 800 micrograms (mcg) of folic acid every day.  If you want to prevent pregnancy, talk to your health care provider about birth control (contraception). Osteoporosis and menopause  Osteoporosis is a disease in which the bones lose minerals and strength with aging. This can result in serious bone fractures. Your risk for osteoporosis can be identified using a bone density scan.  If you are 31 years of age or older, or if you are at risk for osteoporosis and fractures, ask your  health care provider if you should be screened.  Ask your health care provider whether you should take a calcium or vitamin D supplement to lower your risk for osteoporosis.  Menopause may have certain physical symptoms and risks.  Hormone replacement therapy may reduce some of these symptoms and risks. Talk to your health care provider about whether hormone replacement therapy is right for you. Follow these instructions at home:  Schedule regular health, dental, and eye exams.  Stay current with your immunizations.  Do not use any tobacco products including cigarettes, chewing tobacco, or electronic cigarettes.  If you are pregnant, do not drink alcohol.  If you are breastfeeding, limit how much and how often you drink alcohol.  Limit alcohol intake to no more than 1 drink per day for nonpregnant women. One drink equals 12 ounces of beer, 5 ounces of wine, or 1 ounces of hard liquor.  Do not use street drugs.  Do not share needles.  Ask your health care provider for help if you need support or information about quitting drugs.  Tell your health care provider if you often feel depressed.  Tell your health care provider if you have ever been abused or do not feel safe at home. This information is not intended to replace advice given to you by your health care provider. Make sure you discuss any questions you have with your health care provider. Document Released: 10/23/2010 Document Revised: 09/15/2015 Document Reviewed: 01/11/2015 Elsevier Interactive Patient Education  2018 [employer].  Kegel Exercises Kegel exercises help strengthen the muscles that support the rectum, vagina, small intestine, bladder, and uterus. Doing Kegel exercises can help:  Improve bladder and bowel control.  Improve sexual response.  Reduce problems and discomfort during pregnancy.  Kegel exercises involve squeezing your pelvic floor muscles, which are the same muscles you squeeze when you try  to stop the flow of urine. The exercises can be  done while sitting, standing, or lying down, but it is best to vary your position. Phase 1 exercises 1. Squeeze your pelvic floor muscles tight. You should feel a tight lift in your rectal area. If you are a female, you should also feel a tightness in your vaginal area. Keep your stomach, buttocks, and legs relaxed. 2. Hold the muscles tight for up to 10 seconds. 3. Relax your muscles. Repeat this exercise 50 times a day or as many times as told by your health care provider. Continue to do this exercise for at least 4-6 weeks or for as long as told by your health care provider. This information is not intended to replace advice given to you by your health care provider. Make sure you discuss any questions you have with your health care provider. Document Released: 03/26/2012 Document Revised: 12/03/2015 Document Reviewed: 02/27/2015 Elsevier Interactive Patient Education  [employer].

## 2017-12-19 LAB — CBC WITH DIFFERENTIAL/PLATELET
BASOS ABS: 0 10*3/uL (ref 0.0–0.2)
Basos: 0 %
EOS (ABSOLUTE): 0.4 10*3/uL (ref 0.0–0.4)
Eos: 4 %
Hematocrit: 34.9 % (ref 34.0–46.6)
Hemoglobin: 10.8 g/dL — ABNORMAL LOW (ref 11.1–15.9)
IMMATURE GRANS (ABS): 0 10*3/uL (ref 0.0–0.1)
IMMATURE GRANULOCYTES: 0 %
LYMPHS: 29 %
Lymphocytes Absolute: 2.7 10*3/uL (ref 0.7–3.1)
MCH: 24.2 pg — AB (ref 26.6–33.0)
MCHC: 30.9 g/dL — ABNORMAL LOW (ref 31.5–35.7)
MCV: 78 fL — ABNORMAL LOW (ref 79–97)
MONOS ABS: 0.5 10*3/uL (ref 0.1–0.9)
Monocytes: 6 %
NEUTROS PCT: 61 %
Neutrophils Absolute: 5.6 10*3/uL (ref 1.4–7.0)
Platelets: 298 10*3/uL (ref 150–450)
RBC: 4.46 x10E6/uL (ref 3.77–5.28)
RDW: 17.6 % — AB (ref 12.3–15.4)
WBC: 9.2 10*3/uL (ref 3.4–10.8)

## 2017-12-19 LAB — IRON,TIBC AND FERRITIN PANEL
Ferritin: 7 ng/mL — ABNORMAL LOW (ref 15–150)
IRON SATURATION: 7 % — AB (ref 15–55)
IRON: 28 ug/dL (ref 27–159)
TIBC: 381 ug/dL (ref 250–450)
UIBC: 353 ug/dL (ref 131–425)

## 2017-12-19 LAB — VITAMIN D 25 HYDROXY (VIT D DEFICIENCY, FRACTURES): VIT D 25 HYDROXY: 20 ng/mL — AB (ref 30.0–100.0)

## 2017-12-19 LAB — HIV ANTIBODY (ROUTINE TESTING W REFLEX): HIV Screen 4th Generation wRfx: NONREACTIVE

## 2017-12-19 LAB — RPR: RPR Ser Ql: NONREACTIVE

## 2017-12-20 ENCOUNTER — Encounter: Payer: Self-pay | Admitting: Family Medicine

## 2017-12-21 LAB — CHLAMYDIA/GC NAA, CONFIRMATION
Chlamydia trachomatis, NAA: NEGATIVE
NEISSERIA GONORRHOEAE, NAA: NEGATIVE

## 2017-12-23 DIAGNOSIS — D500 Iron deficiency anemia secondary to blood loss (chronic): Secondary | ICD-10-CM | POA: Insufficient documentation

## 2017-12-23 MED ORDER — VITAMIN D (ERGOCALCIFEROL) 1.25 MG (50000 UNIT) PO CAPS: 50000.0000 [IU] | ORAL_CAPSULE | ORAL | 1 refills | Status: DC

## 2017-12-24 DIAGNOSIS — F418 Other specified anxiety disorders: Secondary | ICD-10-CM | POA: Diagnosis not present

## 2018-01-07 DIAGNOSIS — F418 Other specified anxiety disorders: Secondary | ICD-10-CM | POA: Diagnosis not present

## 2018-01-17 ENCOUNTER — Encounter: Payer: Self-pay | Admitting: Family Medicine

## 2018-01-21 ENCOUNTER — Ambulatory Visit
Admission: RE | Admit: 2018-01-21 | Discharge: 2018-01-21 | Disposition: A | Discharge status: Home / Self Care | Payer: BLUE CROSS/BLUE SHIELD | Source: Ambulatory Visit | Attending: Family Medicine | Admitting: Family Medicine

## 2018-01-21 DIAGNOSIS — Z1231 Encounter for screening mammogram for malignant neoplasm of breast: Secondary | ICD-10-CM

## 2018-01-24 DIAGNOSIS — Z23 Encounter for immunization: Secondary | ICD-10-CM | POA: Diagnosis not present

## 2018-01-24 DIAGNOSIS — F418 Other specified anxiety disorders: Secondary | ICD-10-CM | POA: Diagnosis not present

## 2018-02-04 DIAGNOSIS — H524 Presbyopia: Secondary | ICD-10-CM | POA: Diagnosis not present

## 2018-02-04 DIAGNOSIS — H5213 Myopia, bilateral: Secondary | ICD-10-CM | POA: Diagnosis not present

## 2018-02-04 DIAGNOSIS — H52223 Regular astigmatism, bilateral: Secondary | ICD-10-CM | POA: Diagnosis not present

## 2018-02-10 NOTE — Progress Notes (Signed)
47 y.o. G0P0000 Single  female here for annual exam.   Period Cycle (Days): 30 Period Duration (Days): 5-6  Period Pattern: Regular Menstrual Flow: Moderate Menstrual Control: Tampon, Thin pad, Panty liner Menstrual Control Change Freq (Hours): changes pad/tampon every 3-4 hours Dysmenorrhea: None  At the worst she is saturating a super + tampon in 4 hours, + clots. No real change in flow with the nuvaring. Feels her anemia is from her diet.  The patient has had recent blood work with her primary MD, Dr Brigitte Pulse. Hgb was 10.8 with a ferritin of 7. Low vit d, negative std testing. She was started on iorn, over a month ago. She feels tired.  She has started CPAP at the end of May, she is sleeping better, but still feels tired.  She was started on the nuvaring last year, stopped it in July since she wasn't sexually active.   Patient's last menstrual period was 01/27/2018.          Sexually active: No.  The current method of family planning is none.    Exercising: Yes.    walking, weight lifting Smoker:  no  Health Maintenance: Pap:  02/05/2017 negative with negative HR HPV, 01-23-16 ASCUS NEG HR HPV  History of abnormal Pap:  Yes 06/2009 ASCUS + HR HPV MMG:  01/21/2018 density C/BIRADS 1 negative  Colonoscopy:  Never BMD:   Never TDaP:  09-12-12  Gardasil:  N/A   reports that she has never smoked. She has never used smokeless tobacco. She reports that she drinks alcohol. She reports that she does not use drugs. Rare ETOH. 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   Past Medical History:  Diagnosis Date  . Abnormal menstrual cycle 07/27/10  . Anemia   . Anxiety   . 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 Bladder cancer ([place])   . Cancer [facility]) 2008   Bladder  . Candidal vaginitis 04/06/08  . Depression   . Dysrhythmia   . H/O rubella   . H/O varicella   . Heart murmur   . History of varicose veins   . Low iron   . Mitral valve prolapse   . Mitral valve prolapse  05/03/2015  . Palpitations 05/03/2015    Past Surgical History:  Procedure Laterality Date  . BLADDER SURGERY  2008   cancer - no chemo, no urostomy  . CERVICAL BIOPSY  W/ LOOP ELECTRODE EXCISION    . COLPOSCOPY    . CYSTOSCOPY    . LEEP    . LEEP  11/24/2010   Procedure: LOOP ELECTROSURGICAL EXCISION PROCEDURE (LEEP);  Surgeon: Betsy Coder, MD;  Location: [facility];  Service: Gynecology;  Laterality: N/A;  . tubes in ear   1977  . WISDOM TOOTH EXTRACTION  1991    Current Outpatient Medications  Medication Sig Dispense Refill  . Ascorbic Acid (VITAMIN C PO) Take by mouth.    . IRON PO Take by mouth.    . Vitamin D, Ergocalciferol, (DRISDOL) 50000 units CAPS capsule Take 1 capsule (50,000 Units total) by mouth every 7 (seven) days. 12 capsule 1   No current facility-administered medications for this visit.     Family History  Problem Relation Age of Onset  . Cancer Mother 37       Breast  . Hypertension Mother   . Breast cancer Mother 74  . Hyperlipidemia Father   . Arrhythmia Father        atrial fibrillation  .  Cancer Maternal Grandmother 28       breast  . Breast cancer Maternal Grandmother   . Kidney disease Maternal Grandfather   . Cancer Paternal Grandfather 83       colon cancer; skin cancer  . Colon cancer Paternal Grandfather   . Heart disease Paternal Grandfather   . Skin cancer Paternal Grandfather   . Dementia Paternal Grandmother 80  Mom was in her 65's with breast cancer diagnosis  Review of Systems  Constitutional: Negative.   HENT: Negative.   Eyes: Negative.   Respiratory: Negative.   Cardiovascular: Negative.   Gastrointestinal: Negative.   Endocrine: Negative.   Genitourinary: Negative.   Musculoskeletal: Negative.   Skin: Negative.   Allergic/Immunologic: Negative.   Neurological: Negative.   Hematological: Negative.   Psychiatric/Behavioral: Negative.     Exam:   BP 140/90 (BP Location: Right Arm, Patient Position: Sitting, Cuff Size:  Normal)   Pulse 72   Resp 14   Ht 5\' 2"  (1.575 m)   Wt 176 lb (79.8 kg)   LMP 01/27/2018   BMI 32.19 kg/m   Weight change: @WEIGHTCHANGE @ Height:   Height: 5\' 2"  (157.5 cm)  Ht Readings from Last 3 Encounters:  02/12/18 5\' 2"  (1.575 m)  12/18/17 5' 2.8" (1.595 m)  11/12/17 5\' 1"  (1.549 m)    General appearance: alert, cooperative and appears stated age Head: Normocephalic, without obvious abnormality, atraumatic Neck: no adenopathy, supple, symmetrical, trachea midline and thyroid normal to inspection and palpation Lungs: clear to auscultation bilaterally Cardiovascular: regular rate and rhythm Breasts: normal appearance, no masses or tenderness Abdomen: soft, non-tender; non distended,  no masses,  no organomegaly Extremities: extremities normal, atraumatic, no cyanosis or edema Skin: Skin color, texture, turgor normal. No rashes or lesions Lymph nodes: Cervical, supraclavicular, and axillary nodes normal. No abnormal inguinal nodes palpated Neurologic: Grossly normal   Pelvic: External genitalia:  no lesions              Urethra:  normal appearing urethra with no masses, tenderness or lesions              Bartholins and Skenes: normal                 Vagina: normal appearing vagina with normal color and discharge, no lesions              Cervix: no lesions               Bimanual Exam:  Uterus:  normal size, contour, position, consistency, mobility, non-tender              Adnexa: no mass, fullness, tenderness               Rectovaginal: Confirms               Anus:  normal sphincter tone, no lesions  Chaperone was present for exam.  A:  Well Woman with normal exam  Elevated BP today, typically normal.   H/o iron def anemia, on iron. Vaginal bleeding is not excessively heavy, will screen for blood in her stool. Could be dietary  P:   Recheck BP, 148/88. Advised to f/u with primary  If her BP normalizes, she has 3 nuvarings left, may restart. If she does, she should come  back in, in 3 months for a BP check.   CBC, ferritin (on iron for over a month)  Other labs with primary  Discussed breast self exam  Discussed calcium and  vit D intake  CC: Dr Brigitte Pulse

## 2018-02-11 DIAGNOSIS — F418 Other specified anxiety disorders: Secondary | ICD-10-CM | POA: Diagnosis not present

## 2018-02-12 ENCOUNTER — Encounter: Payer: Self-pay | Admitting: Family Medicine

## 2018-02-12 ENCOUNTER — Other Ambulatory Visit: Payer: Self-pay

## 2018-02-12 ENCOUNTER — Encounter: Payer: Self-pay | Admitting: Obstetrics and Gynecology

## 2018-02-12 ENCOUNTER — Ambulatory Visit (INDEPENDENT_AMBULATORY_CARE_PROVIDER_SITE_OTHER): Payer: BLUE CROSS/BLUE SHIELD | Admitting: Obstetrics and Gynecology

## 2018-02-12 VITALS — BP 148/88 | HR 72 | Resp 14 | Ht 62.0 in | Wt 176.0 lb

## 2018-02-12 DIAGNOSIS — Z862 Personal history of diseases of the blood and blood-forming organs and certain disorders involving the immune mechanism: Secondary | ICD-10-CM

## 2018-02-12 DIAGNOSIS — Z1211 Encounter for screening for malignant neoplasm of colon: Secondary | ICD-10-CM | POA: Diagnosis not present

## 2018-02-12 DIAGNOSIS — Z01419 Encounter for gynecological examination (general) (routine) without abnormal findings: Secondary | ICD-10-CM

## 2018-02-12 NOTE — Progress Notes (Signed)
Patient's blood pressure rechecked in patient's right arm while sitting. Reading was 148/88. Patient advised to follow up with PCP in regards to elevated blood pressure. Patient agreeable.

## 2018-02-12 NOTE — Patient Instructions (Signed)
EXERCISE AND DIET:  We recommended that you start or continue a regular exercise program for good health. Regular exercise means any activity that makes your heart beat faster and makes you sweat.  We recommend exercising at least 30 minutes per day at least 3 days a week, preferably 4 or 5.  We also recommend a diet low in fat and sugar.  Inactivity, poor dietary choices and obesity can cause diabetes, heart attack, stroke, and kidney damage, among others.    ALCOHOL AND SMOKING:  Women should limit their alcohol intake to no more than 7 drinks/beers/glasses of wine (combined, not each!) per week. Moderation of alcohol intake to this level decreases your risk of breast cancer and liver damage. And of course, no recreational drugs are part of a healthy lifestyle.  And absolutely no smoking or even second hand smoke. Most people know smoking can cause heart and lung diseases, but did you know it also contributes to weakening of your bones? Aging of your skin?  Yellowing of your teeth and nails?  CALCIUM AND VITAMIN D:  Adequate intake of calcium and Vitamin D are recommended.  The recommendations for exact amounts of these supplements seem to change often, but generally speaking 600 mg of calcium (either carbonate or citrate) and 800 units of Vitamin D per day seems prudent. Certain women may benefit from higher intake of Vitamin D.  If you are among these women, your doctor will have told you during your visit.    PAP SMEARS:  Pap smears, to check for cervical cancer or precancers,  have traditionally been done yearly, although recent scientific advances have shown that most women can have pap smears less often.  However, every woman still should have a physical exam from her gynecologist every year. It will include a breast check, inspection of the vulva and vagina to check for abnormal growths or skin changes, a visual exam of the cervix, and then an exam to evaluate the size and shape of the uterus and  ovaries.  And after 48 years of age, a rectal exam is indicated to check for rectal cancers. We will also provide age appropriate advice regarding health maintenance, like when you should have certain vaccines, screening for sexually transmitted diseases, bone density testing, colonoscopy, mammograms, etc.   MAMMOGRAMS:  All women over 40 years old should have a yearly mammogram. Many facilities now offer a "3D" mammogram, which may cost around $50 extra out of pocket. If possible,  we recommend you accept the option to have the 3D mammogram performed.  It both reduces the number of women who will be called back for extra views which then turn out to be normal, and it is better than the routine mammogram at detecting truly abnormal areas.    COLONOSCOPY:  Colonoscopy to screen for colon cancer is recommended for all women at age 50.  We know, you hate the idea of the prep.  We agree, BUT, having colon cancer and not knowing it is worse!!  Colon cancer so often starts as a polyp that can be seen and removed at colonscopy, which can quite literally save your life!  And if your first colonoscopy is normal and you have no family history of colon cancer, most women don't have to have it again for 10 years.  Once every ten years, you can do something that may end up saving your life, right?  We will be happy to help you get it scheduled when you are ready.    Be sure to check your insurance coverage so you understand how much it will cost.  It may be covered as a preventative service at no cost, but you should check your particular policy.      Breast Self-Awareness Breast self-awareness means being familiar with how your breasts look and feel. It involves checking your breasts regularly and reporting any changes to your health care provider. Practicing breast self-awareness is important. A change in your breasts can be a sign of a serious medical problem. Being familiar with how your breasts look and feel allows  you to find any problems early, when treatment is more likely to be successful. All women should practice breast self-awareness, including women who have had breast implants. How to do a breast self-exam One way to learn what is normal for your breasts and whether your breasts are changing is to do a breast self-exam. To do a breast self-exam: Look for Changes  1. Remove all the clothing above your waist. 2. Stand in front of a mirror in a room with good lighting. 3. Put your hands on your hips. 4. Push your hands firmly downward. 5. Compare your breasts in the mirror. Look for differences between them (asymmetry), such as: ? Differences in shape. ? Differences in size. ? Puckers, dips, and bumps in one breast and not the other. 6. Look at each breast for changes in your skin, such as: ? Redness. ? Scaly areas. 7. Look for changes in your nipples, such as: ? Discharge. ? Bleeding. ? Dimpling. ? Redness. ? A change in position. Feel for Changes  Carefully feel your breasts for lumps and changes. It is best to do this while lying on your back on the floor and again while sitting or standing in the shower or tub with soapy water on your skin. Feel each breast in the following way:  Place the arm on the side of the breast you are examining above your head.  Feel your breast with the other hand.  Start in the nipple area and make  inch (2 cm) overlapping circles to feel your breast. Use the pads of your three middle fingers to do this. Apply light pressure, then medium pressure, then firm pressure. The light pressure will allow you to feel the tissue closest to the skin. The medium pressure will allow you to feel the tissue that is a little deeper. The firm pressure will allow you to feel the tissue close to the ribs.  Continue the overlapping circles, moving downward over the breast until you feel your ribs below your breast.  Move one finger-width toward the center of the body.  Continue to use the  inch (2 cm) overlapping circles to feel your breast as you move slowly up toward your collarbone.  Continue the up and down exam using all three pressures until you reach your armpit.  Write Down What You Find  Write down what is normal for each breast and any changes that you find. Keep a written record with breast changes or normal findings for each breast. By writing this information down, you do not need to depend only on memory for size, tenderness, or location. Write down where you are in your menstrual cycle, if you are still menstruating. If you are having trouble noticing differences in your breasts, do not get discouraged. With time you will become more familiar with the variations in your breasts and more comfortable with the exam. How often should I examine my breasts? Examine   your breasts every month. If you are breastfeeding, the best time to examine your breasts is after a feeding or after using a breast pump. If you menstruate, the best time to examine your breasts is 5-7 days after your period is over. During your period, your breasts are lumpier, and it may be more difficult to notice changes. When should I see my health care provider? See your health care provider if you notice:  A change in shape or size of your breasts or nipples.  A change in the skin of your breast or nipples, such as a reddened or scaly area.  Unusual discharge from your nipples.  A lump or thick area that was not there before.  Pain in your breasts.  Anything that concerns you.  This information is not intended to replace advice given to you by your health care provider. Make sure you discuss any questions you have with your health care provider. Document Released: 04/09/2005 Document Revised: 09/15/2015 Document Reviewed: 02/27/2015 Elsevier Interactive Patient Education  2018 Elsevier Inc.  

## 2018-02-14 LAB — CBC
HEMATOCRIT: 36.9 % (ref 34.0–46.6)
HEMOGLOBIN: 12.6 g/dL (ref 11.1–15.9)
MCH: 28 pg (ref 26.6–33.0)
MCHC: 34.1 g/dL (ref 31.5–35.7)
MCV: 82 fL (ref 79–97)
Platelets: 239 10*3/uL (ref 150–450)
RBC: 4.5 x10E6/uL (ref 3.77–5.28)
RDW: 16.3 % — AB (ref 12.3–15.4)
WBC: 8.4 10*3/uL (ref 3.4–10.8)

## 2018-02-14 LAB — FERRITIN: Ferritin: 29 ng/mL (ref 15–150)

## 2018-02-19 DIAGNOSIS — G4733 Obstructive sleep apnea (adult) (pediatric): Secondary | ICD-10-CM | POA: Diagnosis not present

## 2018-02-28 DIAGNOSIS — F418 Other specified anxiety disorders: Secondary | ICD-10-CM | POA: Diagnosis not present

## 2018-03-04 DIAGNOSIS — Z1211 Encounter for screening for malignant neoplasm of colon: Secondary | ICD-10-CM | POA: Diagnosis not present

## 2018-03-05 LAB — SPECIMEN STATUS REPORT

## 2018-03-05 LAB — FECAL OCCULT BLOOD, IMMUNOCHEMICAL: Fecal Occult Bld: NEGATIVE

## 2018-03-12 DIAGNOSIS — G4733 Obstructive sleep apnea (adult) (pediatric): Secondary | ICD-10-CM | POA: Diagnosis not present

## 2018-03-14 DIAGNOSIS — F418 Other specified anxiety disorders: Secondary | ICD-10-CM | POA: Diagnosis not present

## 2018-03-28 ENCOUNTER — Telehealth: Payer: Self-pay | Admitting: Obstetrics and Gynecology

## 2018-03-28 NOTE — Telephone Encounter (Signed)
Erroneous encounter

## 2018-04-02 DIAGNOSIS — F418 Other specified anxiety disorders: Secondary | ICD-10-CM | POA: Diagnosis not present

## 2018-04-25 DIAGNOSIS — F418 Other specified anxiety disorders: Secondary | ICD-10-CM | POA: Diagnosis not present

## 2018-05-01 ENCOUNTER — Encounter: Payer: Self-pay | Admitting: Neurology

## 2018-05-06 ENCOUNTER — Ambulatory Visit: Payer: BLUE CROSS/BLUE SHIELD | Admitting: Neurology

## 2018-05-15 DIAGNOSIS — F418 Other specified anxiety disorders: Secondary | ICD-10-CM | POA: Diagnosis not present

## 2018-05-30 ENCOUNTER — Other Ambulatory Visit: Payer: Self-pay | Admitting: Family Medicine

## 2018-05-30 NOTE — Telephone Encounter (Signed)
Requested medication (s) are due for refill today: yes  Requested medication (s) are on the active medication list: yes  Last refill:  03/11/18  Future visit scheduled: yes  Notes to clinic: not delegated  Requested Prescriptions  Pending Prescriptions Disp Refills   Vitamin D, Ergocalciferol, (DRISDOL) 1.25 MG (50000 UT) CAPS capsule [Pharmacy Med Name: VITAMIN D2 1.25MG (50,000 UNIT)] 12 capsule 1    Sig: TAKE 1 CAPSULE (50,000 UNITS TOTAL) BY MOUTH EVERY 7 (SEVEN) DAYS.     Endocrinology:  Vitamins - Vitamin D Supplementation Failed - 05/30/2018 12:10 AM      Failed - 50,000 IU strengths are not delegated      Failed - Ca in normal range and within 360 days    Calcium  Date Value Ref Range Status  12/06/2016 9.0 8.7 - 10.2 mg/dL Final         Failed - Phosphate in normal range and within 360 days    No results found for: PHOS       Failed - Vitamin D in normal range and within 360 days    Vit D, 25-Hydroxy  Date Value Ref Range Status  12/18/2017 20.0 (L) 30.0 - 100.0 ng/mL Final    Comment:    Vitamin D deficiency has been defined by the Institute of Medicine and an Endocrine Society practice guideline as a level of serum 25-OH vitamin D less than 20 ng/mL (1,2). The Endocrine Society went on to further define vitamin D insufficiency as a level between 21 and 29 ng/mL (2). 1. IOM (Institute of Medicine). 2010. Dietary reference    intakes for calcium and D. [place]: The    [employer]. 2. Holick MF, Binkley [place], Bischoff-Ferrari HA, et al.    Evaluation, treatment, and prevention of vitamin D    deficiency: an Endocrine Society clinical practice    guideline. JCEM. 2011 Jul; 96(7):1911-30.          Passed - Valid encounter within last 12 months    Recent Outpatient Visits          5 months ago Annual physical exam   [facility], Laurey Arrow, MD   9 months ago Need for vaccination against measles   [facility], Laurey Arrow,  MD   1 year ago Hematuria, unspecified type   [facility], Laurey Arrow, MD   1 year ago Hematuria, unspecified type   [facility], [place] D, PA-C   1 year ago Acute cystitis with hematuria   [facility], Laurey Arrow, MD

## 2018-06-02 ENCOUNTER — Other Ambulatory Visit: Payer: Self-pay | Admitting: Family Medicine

## 2018-06-05 DIAGNOSIS — F418 Other specified anxiety disorders: Secondary | ICD-10-CM | POA: Diagnosis not present

## 2018-06-17 ENCOUNTER — Encounter: Payer: Self-pay | Admitting: Family Medicine

## 2018-07-04 DIAGNOSIS — L738 Other specified follicular disorders: Secondary | ICD-10-CM | POA: Diagnosis not present

## 2018-07-04 DIAGNOSIS — D2262 Melanocytic nevi of left upper limb, including shoulder: Secondary | ICD-10-CM | POA: Diagnosis not present

## 2018-07-04 DIAGNOSIS — L821 Other seborrheic keratosis: Secondary | ICD-10-CM | POA: Diagnosis not present

## 2018-07-04 DIAGNOSIS — D2261 Melanocytic nevi of right upper limb, including shoulder: Secondary | ICD-10-CM | POA: Diagnosis not present

## 2018-08-14 DIAGNOSIS — G4733 Obstructive sleep apnea (adult) (pediatric): Secondary | ICD-10-CM | POA: Diagnosis not present

## 2018-10-31 ENCOUNTER — Encounter: Payer: Self-pay | Admitting: Neurology

## 2018-11-11 ENCOUNTER — Encounter: Payer: Self-pay | Admitting: Neurology

## 2018-11-12 ENCOUNTER — Telehealth: Payer: Self-pay

## 2018-11-12 NOTE — Telephone Encounter (Signed)
I called pt to update her chart prior to the virtual visit. No answer, left a message asking her to call me back.

## 2018-11-12 NOTE — Telephone Encounter (Signed)
Pt returned my call. Pt's meds, allergies, and PMH were updated.  Pt reports that her cpap is going well.

## 2018-11-13 ENCOUNTER — Telehealth (INDEPENDENT_AMBULATORY_CARE_PROVIDER_SITE_OTHER): Payer: BC Managed Care – PPO | Admitting: Neurology

## 2018-11-13 ENCOUNTER — Encounter: Payer: Self-pay | Admitting: Neurology

## 2018-11-13 DIAGNOSIS — Z9989 Dependence on other enabling machines and devices: Secondary | ICD-10-CM

## 2018-11-13 DIAGNOSIS — G4733 Obstructive sleep apnea (adult) (pediatric): Secondary | ICD-10-CM

## 2018-11-13 NOTE — Progress Notes (Signed)
Interim history:  Susan Cordova is a 48 year old right-handed woman with an underlying medical history of bladder cancer, UTI, mitral prolapse, palpitations, anxiety, depression, anemia and mild obesity, who presents for a virtual, video based appointment via [place] video visit for follow-up consultation and yearly checkup of her obstructive sleep apnea, on treatment with CPAP.  The patient is unaccompanied today and joins via computer from home, I am located in my office.  I last saw her on 11/12/2017, at which time we talked about her sleep study results.  She was fully compliant with CPAP and reported feeling better including more consolidated sleep, better daytime tiredness.  Today, 11/13/2018: Please also see below for virtual visit documentation.  I reviewed her CPAP compliance data from 10/13/2018 through 11/11/2018 which is a total of 30 days, during which time she used her CPAP every night with percent used days greater than 4 hours at 100%, indicating superb compliance with an average usage of 9 hours and 29 minutes, residual AHI at goal at 0.7/h, leak low with a 95th percentile at 0 L/min on a pressure of 9 cm with EPR of 3.    The patient's allergies, current medications, family history, past medical history, past social history, past surgical history and problem list were reviewed and updated as appropriate.    Previously:    I first met her on 05/27/2017 at the request of her primary care physician, at which time she reported snoring and difficulty with sleep maintenance. I suggested we proceed with a sleep study. She had a baseline sleep study, followed by a CPAP titration study. Her baseline sleep study from 06/26/2017 showed a sleep efficiency of only 47% with a prolonged sleep latency and increased wake after sleep onset with moderate to severe sleep fragmentation noted. She had a decreased percentage of REM sleep. She did not achieve any supine sleep. Total AHI was 8.6 per hour, REM AHI  51.4 per hour. Average oxygen saturation 98%, nadir was 87%. She had no significant PLMS. She was advised to pursue a full night CPAP titration study, which she had on 08/01/2017. Sleep efficiency was improved at 64.2%, sleep latency prolonged at 64.5 minutes, wake after sleep onset 87 minutes. She achieved an increased percentage of slow-wave sleep and REM sleep was mildly reduced at 17% or near normal. She had good results with CPAP of 9 cm. She had no significant PLMS. She indicated that she had slept a little better with CPAP therapy at night. Based on her test results I prescribed CPAP therapy for home use at a pressure of 9 cm.   I reviewed her CPAP compliance data from 10/12/2017 through 11/10/2017, which is a total of 30 days, during which time she used her CPAP every night with percent used days greater than 4 hours at 100%, indicating superb compliance with an average usage of 9 hours and 33 minutes, residual AHI at goal at 0.3 per hour, leak very low, pressure at 9 cm with EPR of 3.   05/27/2017: (She) reports snoring and difficulty maintaining sleep. I reviewed your office note from 04/22/2017. Her Epworth sleepiness score is 2 out of 24, fatigue score is 43 out of 63. 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 She is a nonsmoker and drinks alcohol infrequently, a few times a year on average, caffeine in the form of tea, 1-2 cups per day on average, avoiding caffeine after 4 PM. She denies AM HAs, has had nightly  nocturia, about once or twice per average night, sometimes even 2 or 3 times per night. She sometimes wakes up from her own snoring. She has no family history of OSA. She denies symptoms of restless leg syndrome. She tries to keep a good bedtime routine. She does not take any medication to help her sleep. Going to sleep and not that difficult for her staying asleep is. Bedtime is generally between 9 and 9:30 during the school year, little later in  the summer time, right time around 7, maybe 7:30 in the summertime. She has no children and no pets in the household. She does not have a TV in the bedroom.  Her Past Medical History Is Significant For: Past Medical History:  Diagnosis Date   Abnormal menstrual cycle 07/27/10   Anemia    Anxiety    ASCUS with positive high risk HPV 07/14/09   Bladder cancer ([place])    Cancer ([place]) 2008   Bladder   Candidal vaginitis 04/06/08   Depression    Dysrhythmia    H/O rubella    H/O varicella    Heart murmur    History of varicose veins    Low iron    Mitral valve prolapse    Mitral valve prolapse 05/03/2015   Palpitations 05/03/2015    Her Past Surgical History Is Significant For: Past Surgical History:  Procedure Laterality Date   BLADDER SURGERY  2008   cancer - no chemo, no urostomy   CERVICAL BIOPSY  W/ LOOP ELECTRODE EXCISION     COLPOSCOPY     CYSTOSCOPY     LEEP     LEEP  11/24/2010   Procedure: LOOP ELECTROSURGICAL EXCISION PROCEDURE (LEEP);  Surgeon: Betsy Coder, MD;  Location: [facility];  Service: Gynecology;  Laterality: N/A;   tubes in ear   [place]    Her Family History Is Significant For: Family History  Problem Relation Age of Onset   Cancer Mother 48       Breast   Hypertension Mother    Breast cancer Mother 60   Hyperlipidemia Father    Arrhythmia Father        atrial fibrillation   Cancer Maternal Grandmother 52       breast   Breast cancer Maternal Grandmother    Kidney disease Maternal Grandfather    Cancer Paternal Grandfather 36       colon cancer; skin cancer   Colon cancer Paternal Grandfather    Heart disease Paternal Grandfather    Skin cancer Paternal Grandfather    Dementia Paternal Grandmother 90    Her Social History Is Significant For: Social History   Socioeconomic History   Marital status: Single    Spouse name: Not on file   Number of children: Not on file    Years of education: Not on file   Highest education level: Not on file  Occupational History   Occupation: professor    Employer: [employer] strain: Not on file   Food insecurity    Worry: Not on file    Inability: Not on file   Transportation needs    Medical: Not on file    Non-medical: Not on file  Tobacco Use   Smoking status: Never Smoker   Smokeless tobacco: Never Used  Substance and Sexual Activity   Alcohol use: Yes    Comment: rarely   Drug use: No  Sexual activity: Not Currently    Partners: Male    Birth control/protection: Abstinence  Lifestyle   Physical activity    Days per week: Not on file    Minutes per session: Not on file   Stress: Not on file  Relationships   Social connections    Talks on phone: Not on file    Gets together: Not on file    Attends religious service: Not on file    Active member of club or organization: Not on file    Attends meetings of clubs or organizations: Not on file    Relationship status: Not on file  Other Topics Concern   Not on file  Social History Narrative   Plays tennis and walks for exercise. May have 3-5 alcoholic drinks per year.            Epworth Sleepiness Scale = 2 (as of 05/03/2015)    Her Allergies Are:  No Known Allergies:   Her Current Medications Are:  Outpatient Encounter Medications as of 11/13/2018  Medication Sig   Vitamin D, Ergocalciferol, (DRISDOL) 1.25 MG (50000 UT) CAPS capsule TAKE 1 CAPSULE (50,000 UNITS TOTAL) BY MOUTH EVERY 7 (SEVEN) DAYS.   No facility-administered encounter medications on file as of 11/13/2018.   :  Review of Systems:  Out of a complete 14 point review of systems, all are reviewed and negative with the exception of these symptoms as listed below:  Virtual Visit via Video Note on 11/13/2018: I connected with Susan Cordova on 11/13/18 at  2:00 PM EDT by a video enabled telemedicine application and verified that I am  speaking with the correct person using two identifiers.   I discussed the limitations of evaluation and management by telemedicine and the availability of in person appointments. The patient expressed understanding and agreed to proceed.  History of Present Illness:  She reports doing well with her CPAP.  She indicates that her sleep is not as restless when she uses it.  She had a couple of nights in December 2019 where she did not have her machine with her and noticed that she would wake up more and was more restless.  She is getting her supplies online at this time.  She is up-to-date, changes the filters monthly, she uses distilled water in the humidifier.  She uses a nasal cushion interface.  She may have gained a little bit of weight over this past quarter, particularly because of staying more sedentary and having a tendency towards stress eating.  She has noticed that her apnea score events are slightly higher than they used to be.    Observations/Objective: She is pleasant, conversant, in NAD, Comprehension and language skills are good, speech is clear without dysarthria, hypophonia or voice tremor noted, face is symmetric with normal facial animation and extraocular movements are well preserved.  Hearing is grossly intact.  Airway examination reveals benign findings, stable, tongue protrudes centrally in palate elevates symmetrically.  Upper body mobility and coordination grossly intact, muscle bulk appears to be normal, no abnormal involuntary movements noted.  Assessment and Plan: In summary,Susan Cordova a very pleasant 54 year oldfemalewith an underlying medical history of bladder cancer, UTI, mitral prolapse, palpitations, anxiety, depression, anemia and mild obesity, who presents for A virtual, video based appointment via [place] video visit for follow-up consultation of her obstructive sleep apnea.  She had a baseline sleep study on 06/26/2017 which showed overall mild sleep apnea but  severe during REM sleep.  She had absence of  supine sleep during that study. She had a CPAP titration study in April 2019 with very good results on CPAP of 9 cm.  She continues to be fully compliant with CPAP, uses a nasal Cushion interface, has been Licensed conveyancer.  She feels that her sleep is More restful, less disrupted and she has some improvement in her daytime energy level.  She is motivated to continue with treatment, she has had some interim probably mild weight gain secondary to being less active during this pandemic but her apnea scores are still very good.  She is reassured. She is advised to follow-up routinely in 1 year.I answered all her questions today and she was in agreement.  Follow Up Instructions: 1. FU one year routinely 2. Doing well, UTD with supplies, gets them online 3. Work on wt loss.   I discussed the assessment and treatment plan with the patient. The patient was provided an opportunity to ask questions and all were answered. The patient agreed with the plan and demonstrated an understanding of the instructions.   The patient was advised to call back or seek an in-person evaluation if the symptoms worsen or if the condition fails to improve as anticipated.  I provided 15 minutes of non-face-to-face time during this encounter.   Star Age, MD

## 2018-11-13 NOTE — Patient Instructions (Signed)
Given verbally, during today's virtual video-based encounter, with verbal feedback received.   

## 2019-02-19 ENCOUNTER — Ambulatory Visit: Payer: BLUE CROSS/BLUE SHIELD | Admitting: Obstetrics and Gynecology

## 2019-03-12 ENCOUNTER — Encounter: Payer: Self-pay | Admitting: Obstetrics and Gynecology

## 2019-03-12 ENCOUNTER — Telehealth: Payer: Self-pay | Admitting: Obstetrics and Gynecology

## 2019-03-12 NOTE — Telephone Encounter (Signed)
[name]  Phone Number: [phone]        Hi, Dr. Talbert Nan-  I'm scheduled to come in for an annual exam with you next month but if I can go to a Pap every other year, I'm wondering if I can skip this year due to [place] concerns? What's recommended for people my age with a few abnormal Paps in my history? [I'm going for an annual physical in January with my GP and have been having no problems that I'm aware of.]   Thank you and I hope you are doing well!   Susan Cordova

## 2019-03-12 NOTE — Telephone Encounter (Signed)
Spoke with pt. Pt wanting to verify AEX appt and pap schedule. Pt has hx of abn pap, but pap in 2018 normal and negative for HR HPV. Infomed pt that pap is due this year if Dr Talbert Nan advises. Pt states will keep AEX appt on 04/09/19 at 1pm. Has appt with PCP in January for annual physical. Pt verbalized understanding. Routing to provider for final review. Patient is agreeable to disposition. Will close encounter.

## 2019-03-12 NOTE — Telephone Encounter (Signed)
Patient returned call

## 2019-03-12 NOTE — Telephone Encounter (Signed)
Left messages on both cell and home numbers per DPR. To call back to [place], [place]

## 2019-04-09 ENCOUNTER — Ambulatory Visit: Payer: BC Managed Care – PPO | Admitting: Obstetrics and Gynecology

## 2019-04-15 DIAGNOSIS — G4733 Obstructive sleep apnea (adult) (pediatric): Secondary | ICD-10-CM | POA: Diagnosis not present

## 2019-04-29 ENCOUNTER — Encounter: Payer: Self-pay | Admitting: Obstetrics and Gynecology

## 2019-04-29 ENCOUNTER — Other Ambulatory Visit: Payer: Self-pay | Admitting: Family Medicine

## 2019-04-29 DIAGNOSIS — E785 Hyperlipidemia, unspecified: Secondary | ICD-10-CM | POA: Diagnosis not present

## 2019-04-29 DIAGNOSIS — D500 Iron deficiency anemia secondary to blood loss (chronic): Secondary | ICD-10-CM | POA: Diagnosis not present

## 2019-04-29 DIAGNOSIS — Z124 Encounter for screening for malignant neoplasm of cervix: Secondary | ICD-10-CM | POA: Diagnosis not present

## 2019-04-29 DIAGNOSIS — E559 Vitamin D deficiency, unspecified: Secondary | ICD-10-CM | POA: Diagnosis not present

## 2019-04-29 DIAGNOSIS — D509 Iron deficiency anemia, unspecified: Secondary | ICD-10-CM | POA: Diagnosis not present

## 2019-04-29 DIAGNOSIS — Z1231 Encounter for screening mammogram for malignant neoplasm of breast: Secondary | ICD-10-CM

## 2019-04-29 DIAGNOSIS — Z0001 Encounter for general adult medical examination with abnormal findings: Secondary | ICD-10-CM | POA: Diagnosis not present

## 2019-04-29 DIAGNOSIS — E782 Mixed hyperlipidemia: Secondary | ICD-10-CM | POA: Diagnosis not present

## 2019-06-18 ENCOUNTER — Ambulatory Visit: Payer: BLUE CROSS/BLUE SHIELD

## 2019-06-23 ENCOUNTER — Telehealth: Payer: Self-pay | Admitting: Obstetrics and Gynecology

## 2019-06-23 NOTE — Telephone Encounter (Signed)
Spoke to pt. Pt wanting to review labs from Dr Brigitte Pulse. Pt states wanting explanation for recent lab results. Pt states having hx of bladder cancer and is concerned with lab results and Dr Brigitte Pulse not responding to messages sent to her office. Pt encouraged to call Dr Raul Del office and ask for triage RN there to help with lab results. Pt agreeable and thankful.   Encounter closed.

## 2019-06-23 NOTE — Telephone Encounter (Signed)
Patient would like to discuss some lab work she had done at her primary care office.

## 2019-07-10 DIAGNOSIS — D500 Iron deficiency anemia secondary to blood loss (chronic): Secondary | ICD-10-CM | POA: Diagnosis not present

## 2019-07-10 DIAGNOSIS — E559 Vitamin D deficiency, unspecified: Secondary | ICD-10-CM | POA: Diagnosis not present

## 2019-07-10 DIAGNOSIS — E782 Mixed hyperlipidemia: Secondary | ICD-10-CM | POA: Diagnosis not present

## 2019-07-10 DIAGNOSIS — R82998 Other abnormal findings in urine: Secondary | ICD-10-CM | POA: Diagnosis not present

## 2019-07-15 ENCOUNTER — Telehealth: Payer: Self-pay | Admitting: Obstetrics and Gynecology

## 2019-07-15 NOTE — Telephone Encounter (Signed)
Call made to patient to go over lab work per Dr. Talbert Nan. Left message for call back.

## 2019-07-15 NOTE — Telephone Encounter (Signed)
Please let the patient know that I have reviewed her labs (sorry for the delay, the paper always takes longer to get to).  She does have blood in her urine, if she wasn't on her cycle, I would recommend that she f/u with Urology. If she was on her cycle I would have her come for a repeat micro ua. Her vit d is low, I would recommend she increase her vit d by 1,000 IU a day.  Her lipid panel is abnormal and should be managed by primary care.

## 2019-07-16 ENCOUNTER — Other Ambulatory Visit: Payer: Self-pay

## 2019-07-16 ENCOUNTER — Ambulatory Visit
Admission: RE | Admit: 2019-07-16 | Discharge: 2019-07-16 | Disposition: A | Discharge status: Home / Self Care | Payer: BC Managed Care – PPO | Source: Ambulatory Visit | Attending: Family Medicine | Admitting: Family Medicine

## 2019-07-16 DIAGNOSIS — Z1231 Encounter for screening mammogram for malignant neoplasm of breast: Secondary | ICD-10-CM

## 2019-07-21 NOTE — Telephone Encounter (Signed)
2nd attempt made to reach patient. Left message for call back.

## 2019-07-22 NOTE — Telephone Encounter (Signed)
Patient left message on answering machine returning call. She has heard from pcp's office and she is all set.

## 2019-07-23 ENCOUNTER — Ambulatory Visit: Payer: BC Managed Care – PPO | Admitting: Sports Medicine

## 2019-07-23 ENCOUNTER — Other Ambulatory Visit: Payer: Self-pay

## 2019-07-23 ENCOUNTER — Encounter: Payer: Self-pay | Admitting: Sports Medicine

## 2019-07-23 VITALS — BP 128/90 | Ht 61.0 in | Wt 167.0 lb

## 2019-07-23 DIAGNOSIS — M7582 Other shoulder lesions, left shoulder: Secondary | ICD-10-CM

## 2019-07-23 MED ORDER — MELOXICAM 15 MG PO TABS: 15.0000 mg | ORAL_TABLET | Freq: Every day | ORAL | 0 refills | Status: DC

## 2019-07-23 NOTE — Patient Instructions (Signed)
The pain in your shoulder is caused by tendinitis of your rotator cuff your biceps tendon. -Work on the exercises shown to you at today's visit -Avoid overhead activities at the gym -We have sent in a prescription for meloxicam.  Take this every day for the next 2 weeks.  After that you may take it as needed -We will see back in 4 to 6 weeks if this does not improve your pain

## 2019-07-23 NOTE — Progress Notes (Signed)
PCP: Shawnee Knapp, MD  Subjective:   HPI: Patient is a 49 y.o. female here for evaluation of left shoulder pain.  Pain is been present for the last 6 months.  She denies any injury or trauma.  Initially the pain started after a long car ride.  Pain is located lateral shoulder does not radiate.  She has pain with reaching her arm above her head or behind her back.  She is also been spending time lifting at the gym notes doing the overhead workouts of bothers her as well.  She saw her PCP who diagnosed her with rotator cuff impingement.  She was given a home exercise program which she has been doing.  This is mildly improved her symptoms.  Over-the-counter anti-inflammatories have also mildly improved her symptoms.  She denies any bruising, swelling, numbness, tingling.   Review of Systems: See HPI above.  Past Medical History:  Diagnosis Date  . Abnormal menstrual cycle 07/27/10  . Anemia   . Anxiety   . 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 Bladder cancer ([place])   . Cancer [facility]) 2008   Bladder  . Candidal vaginitis 04/06/08  . Depression   . Dysrhythmia   . H/O rubella   . H/O varicella   . Heart murmur   . History of varicose veins   . Low iron   . Mitral valve prolapse   . Mitral valve prolapse 05/03/2015  . Palpitations 05/03/2015    Current Outpatient Medications on File Prior to Visit  Medication Sig Dispense Refill  . Vitamin D, Ergocalciferol, (DRISDOL) 1.25 MG (50000 UT) CAPS capsule TAKE 1 CAPSULE (50,000 UNITS TOTAL) BY MOUTH EVERY 7 (SEVEN) DAYS. 12 capsule 1   No current facility-administered medications on file prior to visit.    Past Surgical History:  Procedure Laterality Date  . BLADDER SURGERY  2008   cancer - no chemo, no urostomy  . CERVICAL BIOPSY  W/ LOOP ELECTRODE EXCISION    . COLPOSCOPY    . CYSTOSCOPY    . LEEP    . LEEP  11/24/2010   Procedure: LOOP ELECTROSURGICAL EXCISION PROCEDURE (LEEP);  Surgeon: Betsy Coder, MD;  Location: [facility];   Service: Gynecology;  Laterality: N/A;  . tubes in ear   1977  . WISDOM TOOTH EXTRACTION  1991    No Known Allergies  Social History   Socioeconomic History  . Marital status: Single    Spouse name: Not on file  . Number of children: Not on file  . Years of education: Not on file  . Highest education level: Not on file  Occupational History  . Occupation: professor    Employer: [employer]  Tobacco Use  . Smoking status: Never Smoker  . Smokeless tobacco: Never Used  Substance and Sexual Activity  . Alcohol use: Yes    Comment: rarely  . Drug use: No  . Sexual activity: Not Currently    Partners: Male    Birth control/protection: Abstinence  Other Topics Concern  . Not on file  Social History Narrative   Plays tennis and walks for exercise. May have 3-5 alcoholic drinks per year.            Epworth Sleepiness Scale = 2 (as of 05/03/2015)   Social Determinants of Health   Financial Resource Strain:   . Difficulty of Paying Living Expenses:   Food Insecurity:   . Worried About Charity fundraiser in the Last Year:   .  Ran Out of Food in the Last Year:   Transportation Needs:   . Film/video editor (Medical):   Marland Kitchen Lack of Transportation (Non-Medical):   Physical Activity:   . Days of Exercise per Week:   . Minutes of Exercise per Session:   Stress:   . Feeling of Stress :   Social Connections:   . Frequency of Communication with Friends and Family:   . Frequency of Social Gatherings with Friends and Family:   . Attends Religious Services:   . Active Member of Clubs or Organizations:   . Attends Archivist Meetings:   Marland Kitchen Marital Status:   Intimate Partner Violence:   . Fear of Current or Ex-Partner:   . Emotionally Abused:   Marland Kitchen Physically Abused:   . Sexually Abused:     Family History  Problem Relation Age of Onset  . Cancer Mother 65       Breast  . Hypertension Mother   . Breast cancer Mother 68  . Hyperlipidemia Father   .  Arrhythmia Father        atrial fibrillation  . Cancer Maternal Grandmother 65       breast  . Breast cancer Maternal Grandmother   . Kidney disease Maternal Grandfather   . Cancer Paternal Grandfather 17       colon cancer; skin cancer  . Colon cancer Paternal Grandfather   . Heart disease Paternal Grandfather   . Skin cancer Paternal Grandfather   . Dementia Paternal Grandmother 6        Objective:  Physical Exam: BP 128/90   Ht 5\' 1"  (1.549 m)   Wt 167 lb (75.8 kg)   BMI 31.55 kg/m  Gen: NAD, comfortable in exam room Lungs: Breathing comfortably on room air Shoulder Exam Left -Inspection: No discoloration, no deformity -Palpation: Tenderness palpation of the biceps tendon -ROM (active): Abduction: 160 degrees; Forward Flexion: 170 degrees; Internal Rotation: T10 -Strength: Abduction: 5/5; Forward Flexion: 5/5; Internal Rotation: 5/5; External Rotation: 5/5 -Special Tests: Hawkins: Negative; Neers: Negative; Jobs: Positive; O'briens: Negative; Speeds: Negative; Yergasons: Negative; Cross arm: negative -Limb neurovascularly intact, no instability noted  Limited diagnostic ultrasound left shoulder Findings: -Circumferential fluid around the biceps tendon seen in both long and short axis -Normal appearance of the subscapularis -Hypoechoic changes noted at the distal supraspinatus tendon without evidence of tear -Normal appearance of the infraspinatus and teres minor -Fluid noted within the [facility] joint Impression: -Ultrasound findings consistent with rotator cuff tendinitis and biceps tendinitis without evidence of discrete tear   Assessment & Plan:  Patient is a 49 y.o. female here for left shoulder pain  1.  Left rotator cuff tendinitis -Ultrasound findings consistent with tendinitis without tear -Patients home exercises were modified for her -Patient is advised to avoid overhead activities the gym -Patient was sent in a prescription for meloxicam.  She will take this  daily for the next 2 weeks.  After that she will take it as needed.  She was advised not to use other NSAIDs along with this  Patient will follow up in 4 to 6 weeks.  If she does not have improvement will consider adding nitroglycerin patches to her therapy  I observed and examined the patient with Dr. Sheppard Coil and agree with assessment and plan.  Note reviewed and modified by me. Ila Mcgill, MD

## 2019-07-31 DIAGNOSIS — D2261 Melanocytic nevi of right upper limb, including shoulder: Secondary | ICD-10-CM | POA: Diagnosis not present

## 2019-07-31 DIAGNOSIS — D2262 Melanocytic nevi of left upper limb, including shoulder: Secondary | ICD-10-CM | POA: Diagnosis not present

## 2019-07-31 DIAGNOSIS — B360 Pityriasis versicolor: Secondary | ICD-10-CM | POA: Diagnosis not present

## 2019-07-31 DIAGNOSIS — D225 Melanocytic nevi of trunk: Secondary | ICD-10-CM | POA: Diagnosis not present

## 2019-09-03 ENCOUNTER — Encounter: Payer: Self-pay | Admitting: Sports Medicine

## 2019-09-03 ENCOUNTER — Other Ambulatory Visit: Payer: Self-pay

## 2019-09-03 ENCOUNTER — Ambulatory Visit: Payer: BC Managed Care – PPO | Admitting: Sports Medicine

## 2019-09-03 VITALS — BP 142/78 | Ht 61.0 in | Wt 162.0 lb

## 2019-09-03 DIAGNOSIS — M7582 Other shoulder lesions, left shoulder: Secondary | ICD-10-CM | POA: Diagnosis not present

## 2019-09-03 MED ORDER — NITROGLYCERIN 0.2 MG/HR TD PT24
MEDICATED_PATCH | TRANSDERMAL | 1 refills | Status: DC
Start: 2019-09-03 — End: 2020-03-30

## 2019-09-03 NOTE — Progress Notes (Signed)
PCP: Shawnee Knapp, MD  Subjective:   HPI: Patient is a 49 y.o. female here for follow-up of left rotator cuff tendinitis.  Patient notes her pain is improved since her last visit but she is still having some loss of range of motion pain with reaching her arm above her head or behind her back.  She denies any significant nighttime pain.  The pain is located on the anterior and posterior aspect of her shoulder.  There is no numbness or tingling.  The pain does not radiate.  She has been working on her home exercises.  She has been taking anti-inflammatories for the first 2 weeks but stopped this as it was not providing her much benefit.  She will be moving to [place] in 9 days.   Review of Systems: See HPI above. No neck pain No radicular sxs into arm  Past Medical History:  Diagnosis Date  . Abnormal menstrual cycle 07/27/10  . Anemia   . Anxiety   . 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 Bladder cancer ([place])   . Cancer [facility]) 2008   Bladder  . Candidal vaginitis 04/06/08  . Depression   . Dysrhythmia   . H/O rubella   . H/O varicella   . Heart murmur   . History of varicose veins   . Low iron   . Mitral valve prolapse   . Mitral valve prolapse 05/03/2015  . Palpitations 05/03/2015    Current Outpatient Medications on File Prior to Visit  Medication Sig Dispense Refill  . Vitamin D, Ergocalciferol, (DRISDOL) 1.25 MG (50000 UT) CAPS capsule TAKE 1 CAPSULE (50,000 UNITS TOTAL) BY MOUTH EVERY 7 (SEVEN) DAYS. 12 capsule 1  . meloxicam (MOBIC) 15 MG tablet Take 1 tablet (15 mg total) by mouth daily. (Patient not taking: Reported on 09/03/2019) 30 tablet 0   No current facility-administered medications on file prior to visit.    Past Surgical History:  Procedure Laterality Date  . BLADDER SURGERY  2008   cancer - no chemo, no urostomy  . CERVICAL BIOPSY  W/ LOOP ELECTRODE EXCISION    . COLPOSCOPY    . CYSTOSCOPY    . LEEP    . LEEP  11/24/2010   Procedure: LOOP  ELECTROSURGICAL EXCISION PROCEDURE (LEEP);  Surgeon: Betsy Coder, MD;  Location: [facility];  Service: Gynecology;  Laterality: N/A;  . tubes in ear   1977  . WISDOM TOOTH EXTRACTION  1991    No Known Allergies  Social History   Socioeconomic History  . Marital status: Single    Spouse name: Not on file  . Number of children: Not on file  . Years of education: Not on file  . Highest education level: Not on file  Occupational History  . Occupation: professor    Employer: [employer]  Tobacco Use  . Smoking status: Never Smoker  . Smokeless tobacco: Never Used  Substance and Sexual Activity  . Alcohol use: Yes    Comment: rarely  . Drug use: No  . Sexual activity: Not Currently    Partners: Male    Birth control/protection: Abstinence  Other Topics Concern  . Not on file  Social History Narrative   Plays tennis and walks for exercise. May have 3-5 alcoholic drinks per year.            Epworth Sleepiness Scale = 2 (as of 05/03/2015)   Social Determinants of Health   Financial Resource Strain:   . Difficulty  of Paying Living Expenses:   Food Insecurity:   . Worried About Charity fundraiser in the Last Year:   . Arboriculturist in the Last Year:   Transportation Needs:   . Film/video editor (Medical):   Marland Kitchen Lack of Transportation (Non-Medical):   Physical Activity:   . Days of Exercise per Week:   . Minutes of Exercise per Session:   Stress:   . Feeling of Stress :   Social Connections:   . Frequency of Communication with Friends and Family:   . Frequency of Social Gatherings with Friends and Family:   . Attends Religious Services:   . Active Member of Clubs or Organizations:   . Attends Archivist Meetings:   Marland Kitchen Marital Status:   Intimate Partner Violence:   . Fear of Current or Ex-Partner:   . Emotionally Abused:   Marland Kitchen Physically Abused:   . Sexually Abused:     Family History  Problem Relation Age of Onset  . Cancer Mother 83        Breast  . Hypertension Mother   . Breast cancer Mother 60  . Hyperlipidemia Father   . Arrhythmia Father        atrial fibrillation  . Cancer Maternal Grandmother 66       breast  . Breast cancer Maternal Grandmother   . Kidney disease Maternal Grandfather   . Cancer Paternal Grandfather 56       colon cancer; skin cancer  . Colon cancer Paternal Grandfather   . Heart disease Paternal Grandfather   . Skin cancer Paternal Grandfather   . Dementia Paternal Grandmother 3        Objective:  Physical Exam: BP (!) 142/78   Ht 5\' 1"  (1.549 m)   Wt 162 lb (73.5 kg)   BMI 30.61 kg/m  Gen: NAD, comfortable in exam room Lungs: Breathing comfortably on room air Shoulder Exam Left/right -Inspection: No discoloration, no deformity -Palpation: No tenderness to palpation -ROM (active): Abduction: 160 degrees; Forward Flexion: 160 degrees; Internal Rotation: L5 -Strength: Abduction: 5/5; Forward Flexion: 5/5; Internal Rotation: 5/5; External Rotation: 5/5 -Special Tests: Hawkins: Positive; Neers: Positive; Jobs: Positive; O'briens: Negative; Speeds: Negative; Yergasons: Negative; Cross arm: negative -Limb neurovascularly intact, no instability noted     Assessment & Plan:  Patient is a 49 y.o. female here for follow-up of left rotator cuff tendinitis  1.  Left rotator cuff tendinitis -Patient to increase her range of motion activities to include spokes of the wheel to 180 degrees without weight.  She may also do other strength activities such as tricep kick backs and pec flies. Biceps curls -Patient started on nitroglycerin protocol -Patient may take anti-inflammatories as needed  Patient will be moving to [place] in 9 days.  We will provide her with a recommendation of a physician to follow-up with of their  I observed and examined the patient with Dr Sheppard Coil and agree with assessment and plan.  Note reviewed and modified by me.  Ila Mcgill, MD

## 2019-09-03 NOTE — Patient Instructions (Addendum)
The pain in your shoulder is caused by tendinitis of your rotator cuff -You may continue to work on the exercises shown to you at the last visit.  You may now increase the motion on the spokes of the wheel exercise to go all the way up without weight.  He may also start doing tricep kick backs and pec flies if you would like in addition to the previous exercises we showed you -We have sent in a prescription for nitroglycerin patches.  Please see the nitroglycerin protocol outlined below for instructions on how to use this  When he moved to [place] here are list of physicians you can consider seeing: -Daron Offer at [place] at [place] at [place]  Nitroglycerin Protocol   Apply 1/4 nitroglycerin patch to affected area daily.  Change position of patch within the affected area every 24 hours.  You may experience a headache during the first 1-2 weeks of using the patch, these should subside.  If you experience headaches after beginning nitroglycerin patch treatment, you may take your preferred over the counter pain reliever.  Another side effect of the nitroglycerin patch is skin irritation or rash related to patch adhesive.  Please notify our office if you develop more severe headaches or rash, and stop the patch.  Tendon healing with nitroglycerin patch may require 12 to 24 weeks depending on the extent of injury.  Men should not use if taking Viagra, Cialis, or Levitra.   Do not use if you have migraines or rosacea.

## 2019-11-14 IMAGING — MG DIGITAL SCREENING BILATERAL MAMMOGRAM WITH TOMO AND CAD
6 of 10 series · 6 of 30 positions shown · non-contrast
Comparison: Previous exam(s).

CLINICAL DATA: Screening.

EXAM:
DIGITAL SCREENING BILATERAL MAMMOGRAM WITH TOMO AND CAD

[L MLO synth-2D]
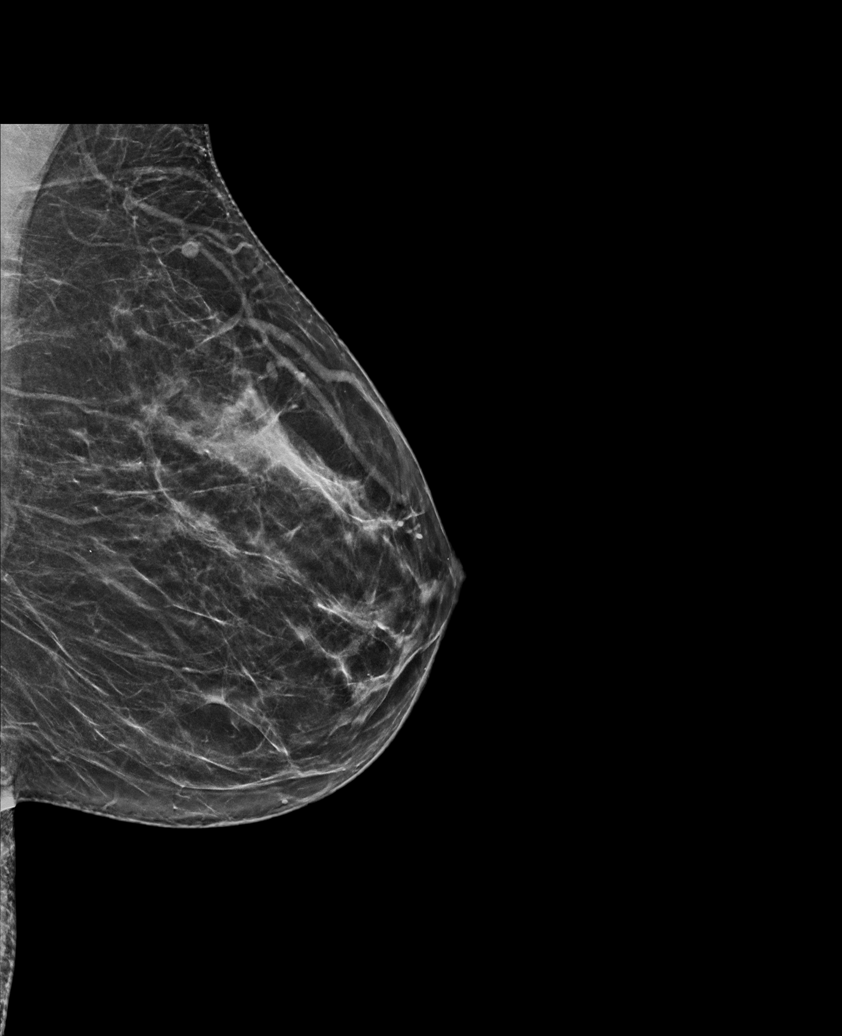

[R MLO synth-2D (1 of 2)]
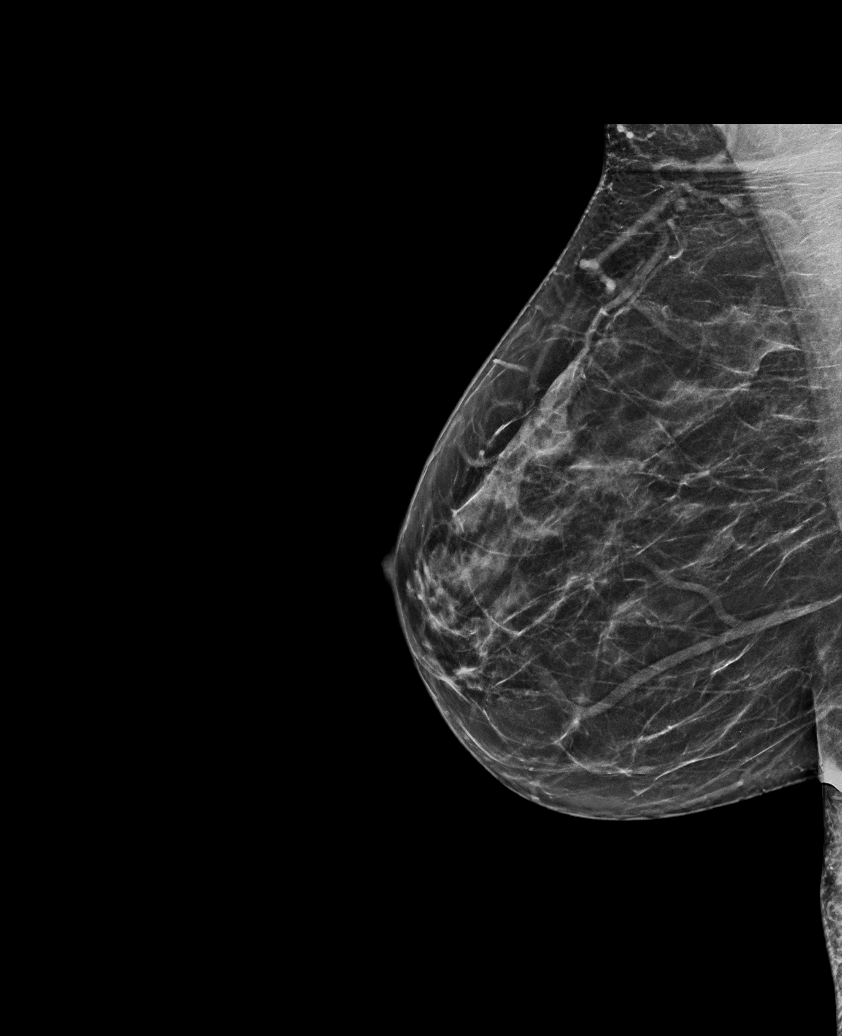

[R CC synth-2D]
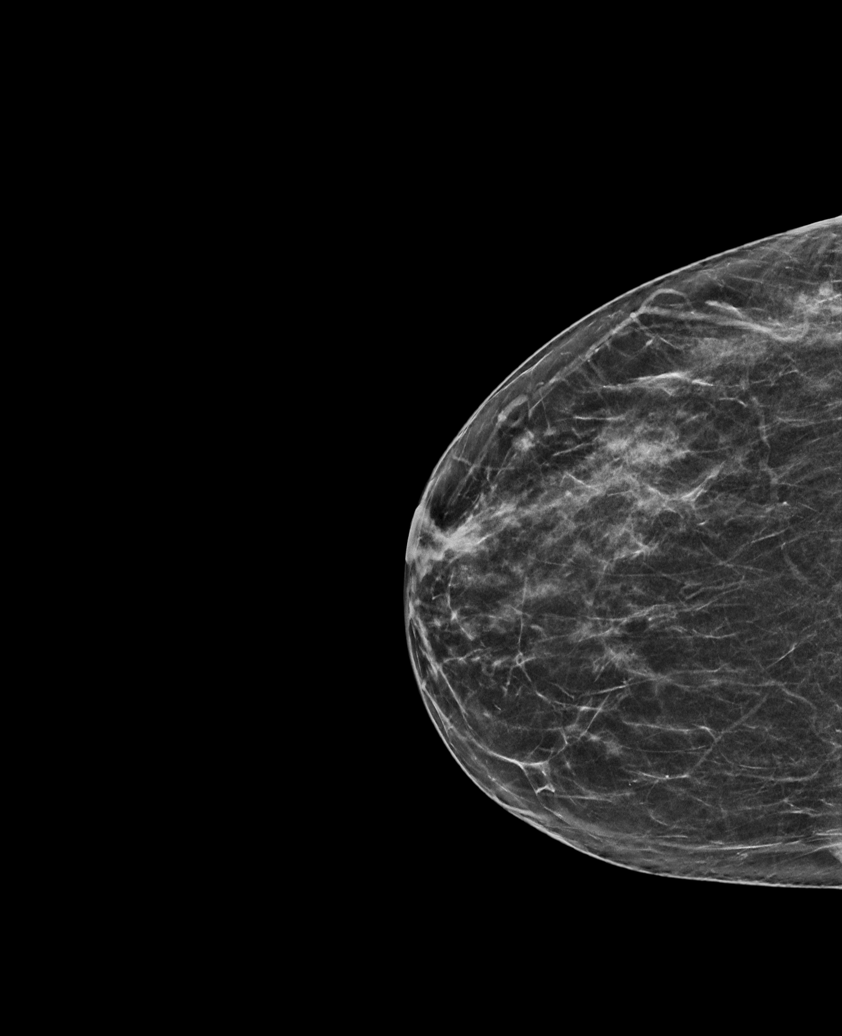

[R MLO synth-2D (2 of 2)]
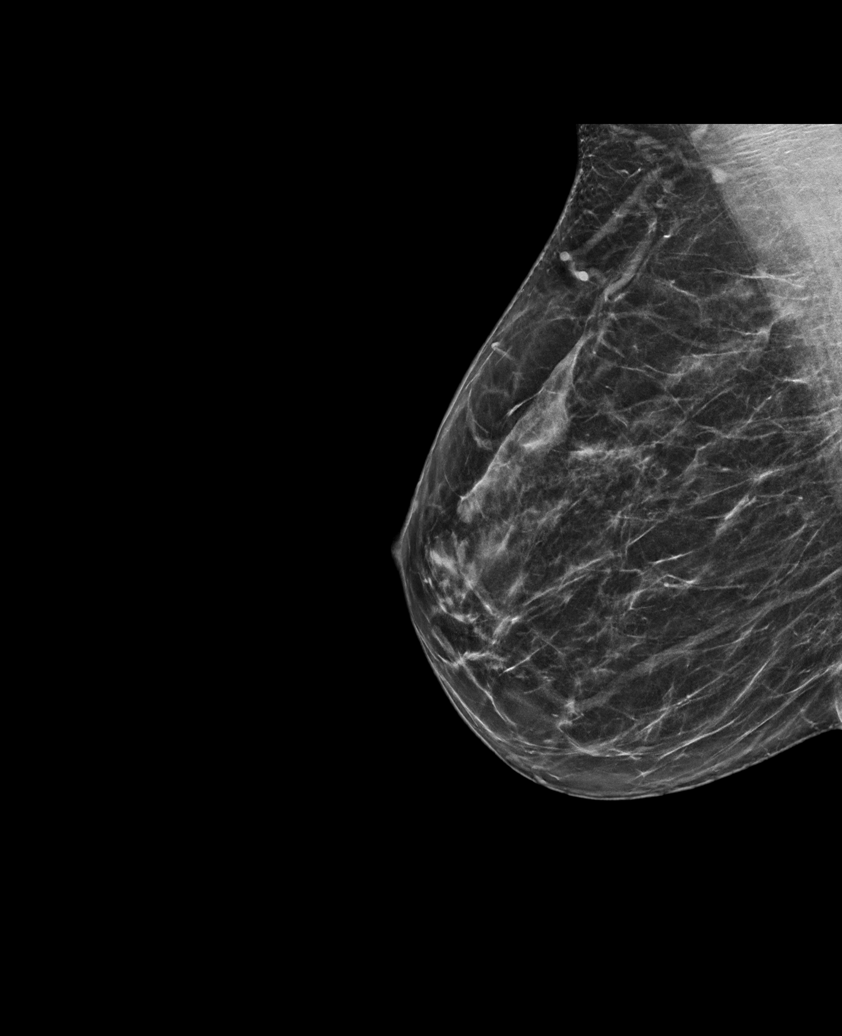

[L CC synth-2D]
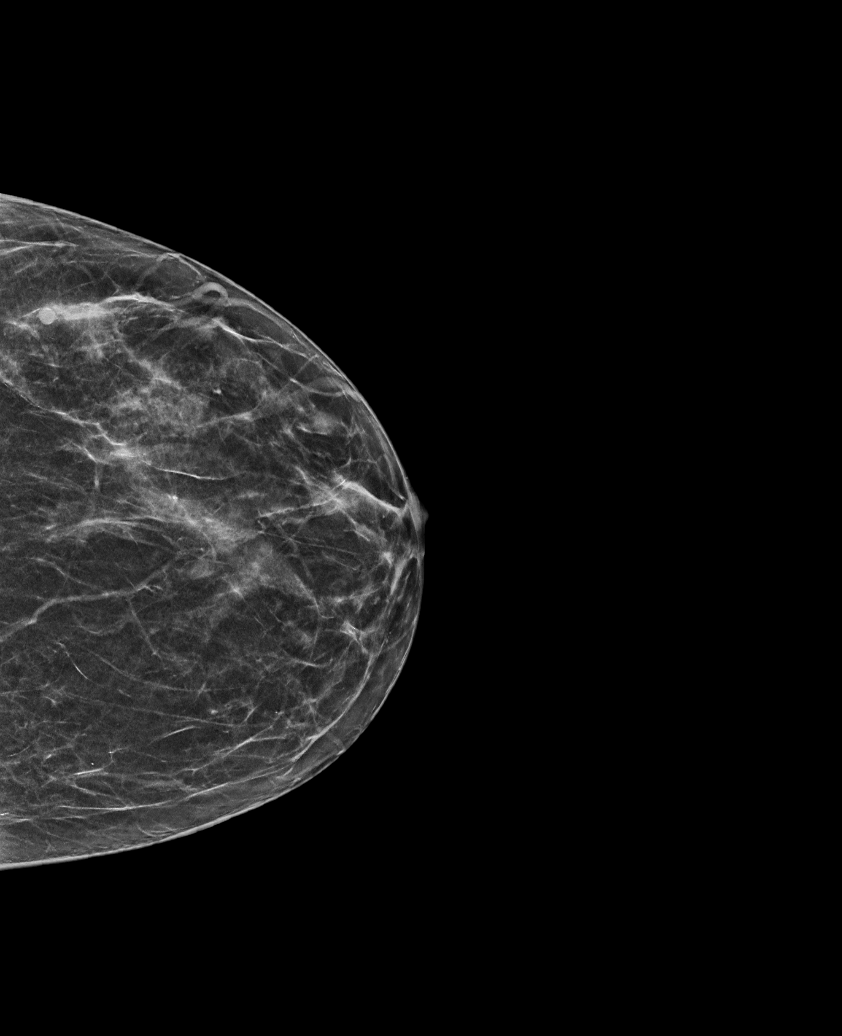

[L CC tomo · tomo slice 30/59.0]
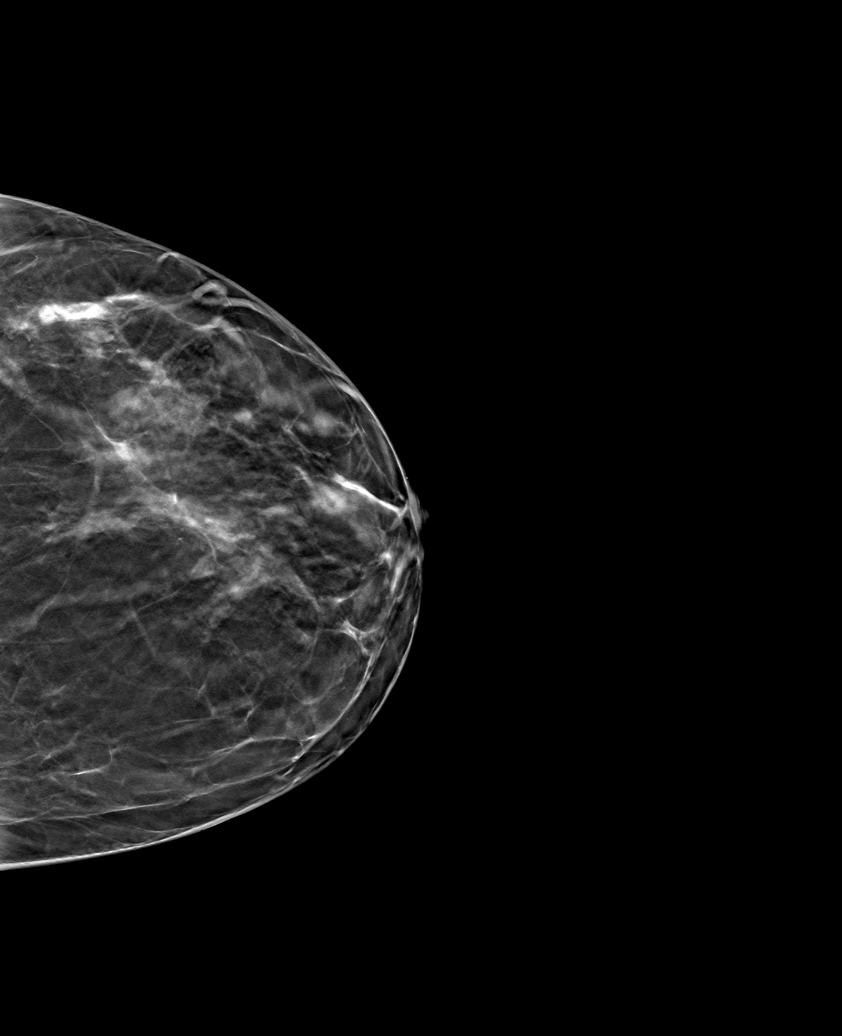

[6 of 30 positions shown; findings below may reference images not displayed]

ACR Breast Density Category c: The breast tissue is heterogeneously
dense, which may obscure small masses.
FINDINGS: There are no findings suspicious for malignancy. Images were
processed with CAD.
IMPRESSION: No mammographic evidence of malignancy. A result letter of this
screening mammogram will be mailed directly to the patient.

RECOMMENDATION:
Screening mammogram in one year. (Code:[id number]-U-LHB)

BI-RADS CATEGORY  1: Negative.

## 2020-01-14 DIAGNOSIS — G4733 Obstructive sleep apnea (adult) (pediatric): Secondary | ICD-10-CM | POA: Diagnosis not present

## 2020-03-30 ENCOUNTER — Other Ambulatory Visit: Payer: Self-pay

## 2020-03-30 MED ORDER — NITROGLYCERIN 0.2 MG/HR TD PT24: MEDICATED_PATCH | TRANSDERMAL | 1 refills | Status: DC

## 2020-05-05 DIAGNOSIS — R0789 Other chest pain: Secondary | ICD-10-CM | POA: Diagnosis not present

## 2020-05-05 DIAGNOSIS — R030 Elevated blood-pressure reading, without diagnosis of hypertension: Secondary | ICD-10-CM | POA: Diagnosis not present

## 2020-05-05 DIAGNOSIS — R519 Headache, unspecified: Secondary | ICD-10-CM | POA: Diagnosis not present

## 2020-05-09 DIAGNOSIS — R0789 Other chest pain: Secondary | ICD-10-CM | POA: Diagnosis not present

## 2020-05-09 DIAGNOSIS — I341 Nonrheumatic mitral (valve) prolapse: Secondary | ICD-10-CM | POA: Diagnosis not present

## 2020-05-09 DIAGNOSIS — D500 Iron deficiency anemia secondary to blood loss (chronic): Secondary | ICD-10-CM | POA: Diagnosis not present

## 2020-05-09 DIAGNOSIS — I10 Essential (primary) hypertension: Secondary | ICD-10-CM | POA: Diagnosis not present

## 2020-05-17 DIAGNOSIS — R079 Chest pain, unspecified: Secondary | ICD-10-CM | POA: Diagnosis not present

## 2020-05-17 DIAGNOSIS — I10 Essential (primary) hypertension: Secondary | ICD-10-CM | POA: Diagnosis not present

## 2020-05-24 DIAGNOSIS — E782 Mixed hyperlipidemia: Secondary | ICD-10-CM | POA: Diagnosis not present

## 2020-05-24 DIAGNOSIS — I10 Essential (primary) hypertension: Secondary | ICD-10-CM | POA: Diagnosis not present

## 2020-05-24 DIAGNOSIS — I341 Nonrheumatic mitral (valve) prolapse: Secondary | ICD-10-CM | POA: Diagnosis not present

## 2020-05-25 DIAGNOSIS — E782 Mixed hyperlipidemia: Secondary | ICD-10-CM | POA: Insufficient documentation

## 2020-07-15 DIAGNOSIS — G4733 Obstructive sleep apnea (adult) (pediatric): Secondary | ICD-10-CM | POA: Diagnosis not present

## 2020-08-15 DIAGNOSIS — E782 Mixed hyperlipidemia: Secondary | ICD-10-CM | POA: Diagnosis not present

## 2020-08-15 DIAGNOSIS — I341 Nonrheumatic mitral (valve) prolapse: Secondary | ICD-10-CM | POA: Diagnosis not present

## 2020-08-15 DIAGNOSIS — I10 Essential (primary) hypertension: Secondary | ICD-10-CM | POA: Diagnosis not present

## 2020-10-31 ENCOUNTER — Other Ambulatory Visit: Payer: Self-pay | Admitting: Family Medicine

## 2020-10-31 DIAGNOSIS — Z1231 Encounter for screening mammogram for malignant neoplasm of breast: Secondary | ICD-10-CM

## 2020-11-29 DIAGNOSIS — D2272 Melanocytic nevi of left lower limb, including hip: Secondary | ICD-10-CM | POA: Diagnosis not present

## 2020-11-29 DIAGNOSIS — L438 Other lichen planus: Secondary | ICD-10-CM | POA: Diagnosis not present

## 2020-11-29 DIAGNOSIS — D485 Neoplasm of uncertain behavior of skin: Secondary | ICD-10-CM | POA: Diagnosis not present

## 2020-11-29 DIAGNOSIS — Z0000 Encounter for general adult medical examination without abnormal findings: Secondary | ICD-10-CM | POA: Diagnosis not present

## 2020-11-29 DIAGNOSIS — D2261 Melanocytic nevi of right upper limb, including shoulder: Secondary | ICD-10-CM | POA: Diagnosis not present

## 2020-11-29 DIAGNOSIS — L821 Other seborrheic keratosis: Secondary | ICD-10-CM | POA: Diagnosis not present

## 2020-11-29 DIAGNOSIS — E785 Hyperlipidemia, unspecified: Secondary | ICD-10-CM | POA: Diagnosis not present

## 2020-11-29 DIAGNOSIS — L814 Other melanin hyperpigmentation: Secondary | ICD-10-CM | POA: Diagnosis not present

## 2020-11-29 DIAGNOSIS — I10 Essential (primary) hypertension: Secondary | ICD-10-CM | POA: Diagnosis not present

## 2020-12-08 DIAGNOSIS — F411 Generalized anxiety disorder: Secondary | ICD-10-CM | POA: Diagnosis not present

## 2020-12-15 DIAGNOSIS — F411 Generalized anxiety disorder: Secondary | ICD-10-CM | POA: Diagnosis not present

## 2020-12-19 DIAGNOSIS — F411 Generalized anxiety disorder: Secondary | ICD-10-CM | POA: Diagnosis not present

## 2020-12-27 ENCOUNTER — Other Ambulatory Visit: Payer: Self-pay

## 2020-12-27 ENCOUNTER — Ambulatory Visit
Admission: RE | Admit: 2020-12-27 | Discharge: 2020-12-27 | Disposition: A | Discharge status: Home / Self Care | Payer: BC Managed Care – PPO | Source: Ambulatory Visit

## 2020-12-27 DIAGNOSIS — Z1231 Encounter for screening mammogram for malignant neoplasm of breast: Secondary | ICD-10-CM | POA: Diagnosis not present

## 2021-01-05 DIAGNOSIS — F411 Generalized anxiety disorder: Secondary | ICD-10-CM | POA: Diagnosis not present

## 2021-01-06 DIAGNOSIS — H52223 Regular astigmatism, bilateral: Secondary | ICD-10-CM | POA: Diagnosis not present

## 2021-01-06 DIAGNOSIS — H524 Presbyopia: Secondary | ICD-10-CM | POA: Diagnosis not present

## 2021-01-06 DIAGNOSIS — H5213 Myopia, bilateral: Secondary | ICD-10-CM | POA: Diagnosis not present

## 2021-01-11 DIAGNOSIS — F411 Generalized anxiety disorder: Secondary | ICD-10-CM | POA: Diagnosis not present

## 2021-01-17 DIAGNOSIS — F411 Generalized anxiety disorder: Secondary | ICD-10-CM | POA: Diagnosis not present

## 2021-01-24 DIAGNOSIS — F411 Generalized anxiety disorder: Secondary | ICD-10-CM | POA: Diagnosis not present

## 2021-01-30 DIAGNOSIS — G4733 Obstructive sleep apnea (adult) (pediatric): Secondary | ICD-10-CM | POA: Diagnosis not present

## 2021-02-07 DIAGNOSIS — F411 Generalized anxiety disorder: Secondary | ICD-10-CM | POA: Diagnosis not present

## 2021-02-15 DIAGNOSIS — F411 Generalized anxiety disorder: Secondary | ICD-10-CM | POA: Diagnosis not present

## 2021-02-22 DIAGNOSIS — F411 Generalized anxiety disorder: Secondary | ICD-10-CM | POA: Diagnosis not present

## 2021-03-03 ENCOUNTER — Ambulatory Visit (HOSPITAL_BASED_OUTPATIENT_CLINIC_OR_DEPARTMENT_OTHER): Payer: BC Managed Care – PPO | Admitting: Cardiovascular Disease

## 2021-03-03 ENCOUNTER — Encounter (HOSPITAL_BASED_OUTPATIENT_CLINIC_OR_DEPARTMENT_OTHER): Payer: Self-pay | Admitting: Cardiovascular Disease

## 2021-03-03 ENCOUNTER — Other Ambulatory Visit: Payer: Self-pay

## 2021-03-03 VITALS — BP 132/80 | HR 61 | Ht 61.0 in | Wt 176.2 lb

## 2021-03-03 DIAGNOSIS — I10 Essential (primary) hypertension: Secondary | ICD-10-CM

## 2021-03-03 DIAGNOSIS — R002 Palpitations: Secondary | ICD-10-CM

## 2021-03-03 DIAGNOSIS — R0609 Other forms of dyspnea: Secondary | ICD-10-CM

## 2021-03-03 DIAGNOSIS — R079 Chest pain, unspecified: Secondary | ICD-10-CM | POA: Diagnosis not present

## 2021-03-03 HISTORY — DX: Essential (primary) hypertension: I10

## 2021-03-03 HISTORY — DX: Other forms of dyspnea: R06.09

## 2021-03-03 NOTE — Assessment & Plan Note (Signed)
She has known PACs and PVCs.  Her heart rate today is 61 on metoprolol 25 mg.  Therefore we cannot titrate the dose.  She notes that overall she is not very bothered by them.  They just make her anxious.  She thinks that knowing that these are not harmful will lessen her distress.  I did tell her that she can take an extra half tablet of metoprolol as needed.

## 2021-03-03 NOTE — Patient Instructions (Signed)
Medication Instructions:  Your physician recommends that you continue on your current medications as directed. Please refer to the Current Medication list given to you today.   *If you need a refill on your cardiac medications before your next appointment, please call your pharmacy*   Lab Work: BMET 1 [place] CT  If you have labs (blood work) drawn today and your tests are completely normal, you will receive your results only by: [place] (if you have MyChart) OR A paper copy in the mail If you have any lab test that is abnormal or we need to change your treatment, we will call you to review the results.   Testing/Procedures: Your physician has requested that you have cardiac CT. Cardiac computed tomography (CT) is a painless test that uses an x-ray machine to take clear, detailed pictures of your heart. For further information please visit [email or web address].[email or web address]. Please follow instruction sheet as given. THE OFFICE WILL CALL YOU TO SCHEDULE ONCE YOUR INSURANCE HAS BEEN REVIEWED. IF YOU DO NOT HEAR IN 2 WEEKS CALL THE OFFICE TO FOLLOW UP    Follow-Up: At [facility], you and your health needs are our priority.  As part of our continuing mission to provide you with exceptional heart care, we have created designated Provider Care Teams.  These Care Teams include your primary Cardiologist (physician) and Advanced Practice Providers (APPs -  Physician Assistants and Nurse Practitioners) who all work together to provide you with the care you need, when you need it.  We recommend signing up for the patient portal called "MyChart".  Sign up information is provided on this After Visit Summary.  MyChart is used to connect with patients for Virtual Visits (Telemedicine).  Patients are able to view lab/test results, encounter notes, upcoming appointments, etc.  Non-urgent messages can be sent to your provider as well.   To learn more about what you can do with MyChart, go to  [email or web address].    Your next appointment:   6 month(s)  The format for your next appointment:   In Person  Provider:   Skeet Latch, MD  Your physician recommends that you schedule a follow-up appointment in: [place]  NP     Other Instructions  MONITOR YOUR BLOOD PRESSURE TWICE A DAY AND LOG.  BRING YOUR READINGS AND MACHINE TO YOUR FOLLOW UP APPOINTMENT      Your cardiac CT will be scheduled at one of the below locations:   [facility] [address] [place], [place] [address] [phone]  [place] [address] [place], [place] [address] [phone]  If scheduled at [facility], please arrive at the [facility] main entrance (entrance A) of [facility] 30 minutes prior to test start time. You can use the FREE valet parking offered at the main entrance (encouraged to control the heart rate for the test) Proceed to the [facility] Radiology Department (first floor) to check-in and test prep.  If scheduled at [facility], please arrive 15 mins early for check-in and test prep.  Please follow these instructions carefully (unless otherwise directed):  Hold all erectile dysfunction medications at least 3 days (72 hrs) prior to test.  On the Night Before the Test: Be sure to Drink plenty of water. Do not consume any caffeinated/decaffeinated beverages or chocolate 12 hours prior to your test. Do not take any antihistamines 12 hours prior to your test.  If the patient has contrast allergy: Patient will need a prescription for Prednisone and very clear instructions (as follows): Prednisone 50 mg - take 13 hours prior to test Take another Prednisone 50 mg 7 hours prior to test Take another Prednisone 50 mg 1 hour prior to test Take Benadryl 50 mg 1 hour prior to test Patient must complete all four doses of above prophylactic  medications. Patient will need a ride after test due to Benadryl.  On the Day of the Test: Drink plenty of water until 1 hour prior to the test. Do not eat any food 4 hours prior to the test. You may take your regular medications prior to the test.  Take metoprolol (Lopressor) two hours prior to test. HOLD Furosemide/Hydrochlorothiazide morning of the test. FEMALES- please wear underwire-free bra if available, avoid dresses & tight clothing      After the Test: Drink plenty of water. After receiving IV contrast, you may experience a mild flushed feeling. This is normal. On occasion, you may experience a mild rash up to 24 hours after the test. This is not dangerous. If this occurs, you can take Benadryl 25 mg and increase your fluid intake. If you experience trouble breathing, this can be serious. If it is severe call 911 IMMEDIATELY. If it is mild, please call our office. If you take any of these medications: Glipizide/Metformin, Avandament, Glucavance, please do not take 48 hours after completing test unless otherwise instructed.  Please allow 2-4 weeks for scheduling of routine cardiac CTs. Some insurance companies require a pre-authorization which may delay scheduling of this test.   For non-scheduling related questions, please contact the cardiac imaging nurse navigator should you have any questions/concerns: Marchia Bond, Cardiac Imaging Nurse Navigator Gordy Clement, Cardiac Imaging Nurse Navigator [facility] Direct Office Dial: [phone]   For scheduling needs, including cancellations and rescheduling, please call [place], [phone].  Cardiac CT Angiogram A cardiac CT angiogram is a procedure to look at the heart and the area around the heart. It may be done to help find the cause of chest pains or other symptoms of heart disease. During this procedure, a substance called contrast dye is injected into the blood vessels in the area to be  checked. A large X-ray machine, called a CT scanner, then takes detailed pictures of the heart and the surrounding area. The procedure is also sometimes called a coronary CT angiogram, coronary artery scanning, or CTA. A cardiac CT angiogram allows the health care provider to see how well blood is flowing to and from the heart. The health care provider will be able to see if there are any problems, such as: Blockage or narrowing of the coronary arteries in the heart. Fluid around the heart. Signs of weakness or disease in the muscles, valves, and tissues of the heart. Tell a health care provider about: Any allergies you have. This is especially important if you have had a previous allergic reaction to contrast dye. All medicines you are taking, including vitamins, herbs, eye drops, creams, and over-the-counter medicines. Any blood disorders you have. Any surgeries you have had. Any medical conditions you have. Whether you are pregnant or may be pregnant. Any anxiety disorders, chronic pain, or other conditions you have that may increase your stress or prevent you from lying still. What are the risks? Generally, this is a safe procedure. However, problems may occur, including: Bleeding. Infection. Allergic reactions to medicines or dyes. Damage to other structures or organs. Kidney  damage from the contrast dye that is used. Increased risk of cancer from radiation exposure. This risk is low. Talk with your health care provider about: The risks and benefits of testing. How you can receive the lowest dose of radiation. What happens before the procedure? Wear comfortable clothing and remove any jewelry, glasses, dentures, and hearing aids. Follow instructions from your health care provider about eating and drinking. This may include: For 12 hours before the procedure -- avoid caffeine. This includes tea, coffee, soda, energy drinks, and diet pills. Drink plenty of water or other fluids that do  not have caffeine in them. Being well hydrated can prevent complications. For 4-6 hours before the procedure -- stop eating and drinking. The contrast dye can cause nausea, but this is less likely if your stomach is empty. Ask your health care provider about changing or stopping your regular medicines. This is especially important if you are taking diabetes medicines, blood thinners, or medicines to treat problems with erections (erectile dysfunction). What happens during the procedure?  Hair on your chest may need to be removed so that small sticky patches called electrodes can be placed on your chest. These will transmit information that helps to monitor your heart during the procedure. An IV will be inserted into one of your veins. You might be given a medicine to control your heart rate during the procedure. This will help to ensure that good images are obtained. You will be asked to lie on an exam table. This table will slide in and out of the CT machine during the procedure. Contrast dye will be injected into the IV. You might feel warm, or you may get a metallic taste in your mouth. You will be given a medicine called nitroglycerin. This will relax or dilate the arteries in your heart. The table that you are lying on will move into the CT machine tunnel for the scan. The person running the machine will give you instructions while the scans are being done. You may be asked to: Keep your arms above your head. Hold your breath. Stay very still, even if the table is moving. When the scanning is complete, you will be moved out of the machine. The IV will be removed. The procedure may vary among health care providers and hospitals. What can I expect after the procedure? After your procedure, it is common to have: A metallic taste in your mouth from the contrast dye. A feeling of warmth. A headache from the nitroglycerin. Follow these instructions at home: Take over-the-counter and  prescription medicines only as told by your health care provider. If you are told, drink enough fluid to keep your urine pale yellow. This will help to flush the contrast dye out of your body. Most people can return to their normal activities right after the procedure. Ask your health care provider what activities are safe for you. It is up to you to get the results of your procedure. Ask your health care provider, or the department that is doing the procedure, when your results will be ready. Keep all follow-up visits as told by your health care provider. This is important. Contact a health care provider if: You have any symptoms of allergy to the contrast dye. These include: Shortness of breath. Rash or hives. A racing heartbeat. Summary A cardiac CT angiogram is a procedure to look at the heart and the area around the heart. It may be done to help find the cause of chest pains or other  symptoms of heart disease. During this procedure, a large X-ray machine, called a CT scanner, takes detailed pictures of the heart and the surrounding area after a contrast dye has been injected into blood vessels in the area. Ask your health care provider about changing or stopping your regular medicines before the procedure. This is especially important if you are taking diabetes medicines, blood thinners, or medicines to treat erectile dysfunction. If you are told, drink enough fluid to keep your urine pale yellow. This will help to flush the contrast dye out of your body. This information is not intended to replace advice given to you by your health care provider. Make sure you discuss any questions you have with your health care provider. Document Revised: 12/21/2020 Document Reviewed: 12/03/2018 Elsevier Patient Education  2022 [employer].

## 2021-03-03 NOTE — Progress Notes (Signed)
000   Cardiology Office Note   Date:  03/03/2021   ID:  Susan Cordova, DOB 09/06/1970, MRN [id number]  PCP:  Sun, Vyvyan, MD  Cardiologist:   Tiffany [place], MD   No chief complaint on file.     History of Present Illness: Susan Cordova is a 50 y.o. female with mitral valve prolapse who presents to reestablish care.  She was first seen in 2017 for an evaluation of palpitations.  Susan Cordova noted palpitations for the preceding 2 years.  Episodes occurred once per day or once every other day and lasted for 1-2 seconds at a time.  She wore an ambulatory monitor that revealed some PACs and PVCs.  She had an echo with LVEF 55 to 60% and no mitral valve prolapse.  PFO cannot be excluded.  In January she was in [place] and was diagnosed with hypertension.  She also had a lot of palpitations and was started on metoprolol.  It initially helped but lately she has more palpitations.  She occasionally checks her BP at home and her BP has been as high as 150-170s.  This occurred when she came home from work and was feeling poorly.  She had a lot of palpitations and tightness in her chest at the time.  She is unsure about whether she rested for 5 minutes before checking her blood pressure.  It tends to be lower in the AM.  She has been walking 5 or 6 days/week for the last couple years.  Lately she had noted that she is more short of breath with exertion.  She is not had any major changes in her life regarding caffeine or stress.  She had labs checked 11/2020 and her hemoglobin, electrolytes, and TSH were all within normal limits.  She had an echo at [facility] 04/2020 with LVEF 60% and trace mitral regurgitation.    Past Medical History:  Diagnosis Date   Abnormal menstrual cycle 07/27/10   Anemia    Anxiety    ASCUS with positive high risk HPV 07/14/09   Bladder cancer (HCC)    Cancer (HCC) 2008   Bladder   Candidal vaginitis 04/06/08   Depression    Dysrhythmia    Essential hypertension 03/03/2021    Exertional dyspnea 03/03/2021   H/O rubella    H/O varicella    Heart murmur    History of varicose veins    Low iron    Mitral valve prolapse    Mitral valve prolapse 05/03/2015   Palpitations 05/03/2015    Past Surgical History:  Procedure Laterality Date   BLADDER SURGERY  2008   cancer - no chemo, no urostomy   CERVICAL BIOPSY  W/ LOOP ELECTRODE EXCISION     COLPOSCOPY     CYSTOSCOPY     LEEP     LEEP  11/24/2010   Procedure: LOOP ELECTROSURGICAL EXCISION PROCEDURE (LEEP);  Surgeon: Naima A Dillard, MD;  Location: [facility];  Service: Gynecology;  Laterality: N/A;   tubes in ear   1977   WISDOM TOOTH EXTRACTION  1991     Current Outpatient Medications  Medication Sig Dispense Refill   Cholecalciferol (VITAMIN D) 50 MCG (2000 UT) tablet Take 1 tablet by mouth daily.     meclizine (ANTIVERT) 25 MG tablet Take 1 tablet by mouth as needed.     metoprolol succinate (TOPROL-XL) 25 MG 24 hr tablet Take 25 mg by mouth daily.     No current facility-administered medications for this visit.      Allergies:   Patient has no known allergies.    Social History:  The patient  reports that she has never smoked. She has never used smokeless tobacco. She reports current alcohol use. She reports that she does not use drugs.   Family History:  The patient's family history includes Arrhythmia in her father; Breast cancer (age of onset: 93) in her mother; Breast cancer (age of onset: 41) in her maternal grandmother; Cancer (age of onset: 51) in her mother; Cancer (age of onset: 82) in her paternal grandfather; Cancer (age of onset: 35) in her maternal grandmother; Colon cancer in her paternal grandfather; Dementia (age of onset: 40) in her paternal grandmother; Heart disease in her paternal grandfather; Hyperlipidemia in her father; Hypertension in her mother; Kidney disease in her maternal grandfather; Skin cancer in her paternal grandfather.    ROS:  Please see the history of present illness.    Otherwise, review of systems are positive for none.   All other systems are reviewed and negative.    PHYSICAL EXAM: VS:  BP 132/80 (BP Location: Left Arm, Patient Position: Sitting, Cuff Size: Large)   Pulse 61   Ht 5' 1" (1.549 m)   Wt 176 lb 3.2 oz (79.9 kg)   BMI 33.29 kg/m  , BMI Body mass index is 33.29 kg/m. GENERAL:  Well appearing HEENT:  Pupils equal round and reactive, fundi not visualized, oral mucosa unremarkable NECK:  No jugular venous distention, waveform within normal limits, carotid upstroke brisk and symmetric, no bruits, no thyromegaly LYMPHATICS:  No cervical adenopathy LUNGS:  Clear to auscultation bilaterally HEART:  RRR.  PMI not displaced or sustained,S1 and S2 within normal limits, no S3, no S4, no clicks, no rubs, III/VI early to late systolic murmur loudest across the anterior precordium and radiating to the carotids bilaterally. ABD:  Flat, positive bowel sounds normal in frequency in pitch, no bruits, no rebound, no guarding, no midline pulsatile mass, no hepatomegaly, no splenomegaly EXT:  2 plus pulses throughout, no edema, no cyanosis no clubbing SKIN:  No rashes no nodules NEURO:  Cranial nerves II through XII grossly intact, motor grossly intact throughout PSYCH:  Cognitively intact, oriented to person place and time   EKG:  EKG is ordered today. 03/03/21: Sinus rhythm.  Rate 61 bpm.   Echo 2/30/17: Study Conclusions   - Left ventricle: The cavity size was normal. Systolic function was    normal. The estimated ejection fraction was in the range of 55%    to 60%. Wall motion was normal; there were no regional wall    motion abnormalities.  - Mitral valve: No significant prolapse noted.  - Atrial septum: A patent foramen ovale cannot be excluded.  - Impressions: Normal GLS -20.6.   Impressions:   - Normal GLS -20.6.   48 Hour Holter Monitor 05/04/15:   Quality: Fair.  Baseline artifact. Predominant rhythm: sinus rhythm and sinus  arrhythmia Average heart rate: 67 bpm Max heart rate: 125 bpm Min heart rate: 46 bpm Pauses >2.5 seconds: 0 1 PVC 19 PACs were noted.   Patient did not submit a symptom diary  Recent Labs: No results found for requested labs within last 8760 hours.   01/08/14:  Sodium 138, potassium 4.6, BUN 12, creatinine 0.81 AST 12, ALT 9 WBC 6.6, and hemoglobin 12.9, hematocrit 37.4, platelets 228 ESR dateTSH 2.1 Lipid Panel    Component Value Date/Time   CHOL 193 12/06/2016 0923   TRIG 88 12/06/2016 0923   HDL  49 12/06/2016 0923   CHOLHDL 3.9 12/06/2016 0923   CHOLHDL 4.4 12/05/2015 0849   VLDL 16 12/05/2015 0849   LDLCALC 126 (H) 12/06/2016 0923      Wt Readings from Last 3 Encounters:  03/03/21 176 lb 3.2 oz (79.9 kg)  09/03/19 162 lb (73.5 kg)  07/23/19 167 lb (75.8 kg)      ASSESSMENT AND PLAN:  Essential hypertension Blood pressures well have been controlled today.  It has been elevated at times at home.  This occurred when she was having palpitations and chest pressure.  For now we will continue metoprolol and have her track her blood pressures twice a day.  Exertional dyspnea She is exercising regularly and lately notes exertional dyspnea.  She is also had some episodes of chest heaviness.  We will get a coronary CTA to evaluate for CAD.  Palpitations She has known PACs and PVCs.  Her heart rate today is 61 on metoprolol 25 mg.  Therefore we cannot titrate the dose.  She notes that overall she is not very bothered by them.  They just make her anxious.  She thinks that knowing that these are not harmful will lessen her distress.  I did tell her that she can take an extra half tablet of metoprolol as needed.    Current medicines are reviewed at length with the patient today.  The patient does not have concerns regarding medicines.  The following changes have been made:  no change  Labs/ tests ordered today include:   Orders Placed This Encounter  Procedures   CT  CORONARY MORPH W/CTA COR W/SCORE W/CA W/CM &/OR WO/CM   Basic metabolic panel   EKG 40-GQQP      Disposition:   FU with Lukasz Rogus C. Oval Linsey, MD, [facility] in 1 year.    This note was written with the assistance of speech recognition software.  Please excuse any transcriptional errors.  Signed, Janeah Kovacich C. Oval Linsey, MD, [facility]  03/03/2021 10:56 AM    [place]

## 2021-03-03 NOTE — Assessment & Plan Note (Signed)
She is exercising regularly and lately notes exertional dyspnea.  She is also had some episodes of chest heaviness.  We will get a coronary CTA to evaluate for CAD.

## 2021-03-03 NOTE — Assessment & Plan Note (Signed)
Blood pressures well have been controlled today.  It has been elevated at times at home.  This occurred when she was having palpitations and chest pressure.  For now we will continue metoprolol and have her track her blood pressures twice a day.

## 2021-03-04 ENCOUNTER — Encounter (HOSPITAL_BASED_OUTPATIENT_CLINIC_OR_DEPARTMENT_OTHER): Payer: Self-pay

## 2021-03-07 ENCOUNTER — Other Ambulatory Visit (HOSPITAL_BASED_OUTPATIENT_CLINIC_OR_DEPARTMENT_OTHER): Payer: Self-pay

## 2021-03-07 ENCOUNTER — Other Ambulatory Visit: Payer: BC Managed Care – PPO

## 2021-03-07 DIAGNOSIS — I10 Essential (primary) hypertension: Secondary | ICD-10-CM

## 2021-03-07 DIAGNOSIS — F331 Major depressive disorder, recurrent, moderate: Secondary | ICD-10-CM | POA: Diagnosis not present

## 2021-03-07 DIAGNOSIS — R079 Chest pain, unspecified: Secondary | ICD-10-CM | POA: Diagnosis not present

## 2021-03-07 DIAGNOSIS — R0609 Other forms of dyspnea: Secondary | ICD-10-CM | POA: Diagnosis not present

## 2021-03-07 NOTE — Progress Notes (Signed)
Pt. Scheduled for labs today at [place]. She will let me know if she doesn't make it today to get her rescheduled.

## 2021-03-08 LAB — BASIC METABOLIC PANEL
BUN/Creatinine Ratio: 12 (ref 9–23)
BUN: 9 mg/dL (ref 6–24)
CO2: 24 mmol/L (ref 20–29)
Calcium: 9.4 mg/dL (ref 8.7–10.2)
Chloride: 105 mmol/L (ref 96–106)
Creatinine, Ser: 0.77 mg/dL (ref 0.57–1.00)
Glucose: 102 mg/dL — ABNORMAL HIGH (ref 70–99)
Potassium: 4.3 mmol/L (ref 3.5–5.2)
Sodium: 143 mmol/L (ref 134–144)
eGFR: 93 mL/min/{1.73_m2} (ref >59)

## 2021-03-20 ENCOUNTER — Telehealth (HOSPITAL_COMMUNITY): Payer: Self-pay | Admitting: *Deleted

## 2021-03-20 NOTE — Telephone Encounter (Signed)
Attempted to call patient regarding upcoming cardiac CT appointment. °Left message on voicemail with name and callback number ° °Job Holtsclaw RN Navigator Cardiac Imaging °[facility] °[phone] Office °[phone] Cell ° °

## 2021-03-21 ENCOUNTER — Other Ambulatory Visit: Payer: Self-pay

## 2021-03-21 ENCOUNTER — Ambulatory Visit (HOSPITAL_COMMUNITY)
Admission: RE | Admit: 2021-03-21 | Discharge: 2021-03-21 | Disposition: A | Discharge status: Home / Self Care | Payer: BC Managed Care – PPO | Source: Ambulatory Visit | Attending: Cardiovascular Disease | Admitting: Cardiovascular Disease

## 2021-03-21 DIAGNOSIS — R079 Chest pain, unspecified: Secondary | ICD-10-CM | POA: Diagnosis not present

## 2021-03-21 DIAGNOSIS — I10 Essential (primary) hypertension: Secondary | ICD-10-CM | POA: Insufficient documentation

## 2021-03-21 DIAGNOSIS — R0609 Other forms of dyspnea: Secondary | ICD-10-CM | POA: Diagnosis not present

## 2021-03-21 MED ORDER — NITROGLYCERIN 0.4 MG SL SUBL
0.8000 mg | SUBLINGUAL_TABLET | SUBLINGUAL | Status: DC | PRN
  Administered 2021-03-21: 0.8 mg via SUBLINGUAL

## 2021-03-21 MED ORDER — IOHEXOL 350 MG/ML SOLN
95.0000 mL | Freq: Once | INTRAVENOUS | Status: AC | PRN
  Administered 2021-03-21: 95 mL via INTRAVENOUS

## 2021-03-22 ENCOUNTER — Ambulatory Visit (HOSPITAL_BASED_OUTPATIENT_CLINIC_OR_DEPARTMENT_OTHER): Payer: BC Managed Care – PPO | Admitting: Cardiovascular Disease

## 2021-03-22 DIAGNOSIS — F331 Major depressive disorder, recurrent, moderate: Secondary | ICD-10-CM | POA: Diagnosis not present

## 2021-03-28 NOTE — Progress Notes (Signed)
50 y.o. G0P0000 Single Unavailable Unavailable female here for annual exam.  Cycles are lighter than they used to be, changes her diva cup at most 3 x in 24 hours. No menopausal symptoms.  Period Cycle (Days): 28 Period Duration (Days): 5 Period Pattern: Regular Menstrual Flow: Moderate Menstrual Control: Other (Comment) (Diva cup) Dysmenorrhea: None  H/O bladder in 2008, wants urinalysis to check for blood. Was told she didn't need cystoscopies any more.   Mom died in 2022-10-19 of lung cancer. Susan Cordova was able to go spend time with her prior to her passing. Dad is doing okay.   Patient's last menstrual period was 03/30/2021.          Sexually active: No.  The current method of family planning is abstinence.    Exercising: Yes.     Walking  Smoker:  no  Health Maintenance: Pap:   04/29/19 negative, negative hpv; 02/05/2017 negative with negative HR HPV; 01-23-16 ASCUS NEG HR HPV  History of abnormal Pap:   Yes 06/2009 ASCUS + HR HPV, leep in 2012 MMG:  12/27/20 density B Bi-rads 1 neg  BMD:   never  Colonoscopy: never, GI consult to do the colonoscopy is scheduled.   TDaP:  09/12/12  Gardasil: n/a   reports that she has never smoked. She has never used smokeless tobacco. She reports current alcohol use. She reports that she does not use drugs. Just occasional ETOH. 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   Past Medical History:  Diagnosis Date   Abnormal menstrual cycle 07/27/10   Anemia    Anxiety    ASCUS with positive high risk HPV 07/14/09   Bladder cancer ([place])    Cancer ([place]) 2008   Bladder   Candidal vaginitis 04/06/08   Depression    Dysrhythmia    Essential hypertension 03/03/2021   Exertional dyspnea 03/03/2021   H/O rubella    H/O varicella    Heart murmur    History of varicose veins    Low iron    Mitral valve prolapse    Mitral valve prolapse 05/03/2015   Palpitations 05/03/2015    Past Surgical History:  Procedure Laterality Date   BLADDER SURGERY   2008   cancer - no chemo, no urostomy   CERVICAL BIOPSY  W/ LOOP ELECTRODE EXCISION     COLPOSCOPY     CYSTOSCOPY     LEEP     LEEP  11/24/2010   Procedure: LOOP ELECTROSURGICAL EXCISION PROCEDURE (LEEP);  Surgeon: Betsy Coder, MD;  Location: [facility];  Service: Gynecology;  Laterality: N/A;   tubes in ear   [place]    Current Outpatient Medications  Medication Sig Dispense Refill   atorvastatin (LIPITOR) 20 MG tablet      Cholecalciferol (VITAMIN D) 50 MCG (2000 UT) tablet Take 1 tablet by mouth daily.     meclizine (ANTIVERT) 25 MG tablet Take 1 tablet by mouth as needed.     metoprolol succinate (TOPROL-XL) 25 MG 24 hr tablet Take 25 mg by mouth daily.     No current facility-administered medications for this visit.    Family History  Problem Relation Age of Onset   Cancer Mother 47       Breast   Hypertension Mother    Breast cancer Mother 26   Hyperlipidemia Father    Arrhythmia Father        atrial fibrillation   Cancer Maternal Grandmother 50  breast   Breast cancer Maternal Grandmother 75   Kidney disease Maternal Grandfather    Dementia Paternal Grandmother 77   Cancer Paternal Grandfather 36       colon cancer; skin cancer   Colon cancer Paternal Grandfather    Heart disease Paternal Grandfather    Skin cancer Paternal Grandfather     Review of Systems  All other systems reviewed and are negative.  Exam:   BP 126/74   Pulse 78   Ht 5\' 1"  (1.549 m)   Wt 180 lb (81.6 kg)   LMP 03/30/2021   SpO2 100%   BMI 34.01 kg/m   Weight change: @WEIGHTCHANGE @ Height:   Height: 5\' 1"  (154.9 cm)  Ht Readings from Last 3 Encounters:  04/04/21 5\' 1"  (1.549 m)  03/03/21 5\' 1"  (1.549 m)  09/03/19 5\' 1"  (1.549 m)    General appearance: alert, cooperative and appears stated age Head: Normocephalic, without obvious abnormality, atraumatic Neck: no adenopathy, supple, symmetrical, trachea midline and thyroid normal to inspection and  palpation Lungs: clear to auscultation bilaterally Cardiovascular: regular rate and rhythm Breasts: normal appearance, no masses or tenderness Abdomen: soft, non-tender; non distended,  no masses,  no organomegaly Extremities: extremities normal, atraumatic, no cyanosis or edema Skin: Skin color, texture, turgor normal. No rashes or lesions Lymph nodes: Cervical, supraclavicular, and axillary nodes normal. No abnormal inguinal nodes palpated Neurologic: Grossly normal   Pelvic: External genitalia:  no lesions              Urethra:  normal appearing urethra with no masses, tenderness or lesions              Bartholins and Skenes: normal                 Vagina: normal appearing vagina with normal color and discharge, no lesions              Cervix: no lesions               Bimanual Exam:  Uterus:   no masses or tenderness              Adnexa: no mass, fullness, tenderness               Rectovaginal: Confirms               Anus:  normal sphincter tone, no lesions  Gae Dry chaperoned for the exam.  1. Well woman exam Pap next year Mammogram UTD Colonoscopy is being scheduled Discussed breast self exam Discussed calcium and vit D intake Labs with primary  2. History of bladder cancer Back in 2008. Will check her urine for blood - Urinalysis, Complete  3. History of loop electrical excision procedure (LEEP) In 2012 Pap next year

## 2021-04-03 DIAGNOSIS — F331 Major depressive disorder, recurrent, moderate: Secondary | ICD-10-CM | POA: Diagnosis not present

## 2021-04-04 ENCOUNTER — Ambulatory Visit (INDEPENDENT_AMBULATORY_CARE_PROVIDER_SITE_OTHER): Payer: BC Managed Care – PPO | Admitting: Obstetrics and Gynecology

## 2021-04-04 ENCOUNTER — Other Ambulatory Visit: Payer: Self-pay

## 2021-04-04 ENCOUNTER — Encounter: Payer: Self-pay | Admitting: Obstetrics and Gynecology

## 2021-04-04 VITALS — BP 126/74 | HR 78 | Ht 61.0 in | Wt 180.0 lb

## 2021-04-04 DIAGNOSIS — Z01419 Encounter for gynecological examination (general) (routine) without abnormal findings: Secondary | ICD-10-CM

## 2021-04-04 DIAGNOSIS — Z9889 Other specified postprocedural states: Secondary | ICD-10-CM | POA: Diagnosis not present

## 2021-04-04 DIAGNOSIS — Z8551 Personal history of malignant neoplasm of bladder: Secondary | ICD-10-CM

## 2021-04-04 LAB — URINALYSIS, MICROSCOPIC ONLY
Bacteria, UA: NONE SEEN /HPF
Casts: NONE SEEN /LPF
Crystals: NONE SEEN /HPF
Hyaline Cast: NONE SEEN /LPF
RBC / HPF: NONE SEEN /HPF (ref 0–2)
WBC, UA: NONE SEEN /HPF (ref 0–5)
Yeast: NONE SEEN /HPF

## 2021-04-04 NOTE — Patient Instructions (Signed)

## 2021-04-04 NOTE — Addendum Note (Signed)
Addended by: Yates Decamp on: 04/04/2021 04:42 PM   Modules accepted: Orders

## 2021-04-05 ENCOUNTER — Encounter: Payer: Self-pay | Admitting: Obstetrics and Gynecology

## 2021-04-07 ENCOUNTER — Other Ambulatory Visit: Payer: Self-pay

## 2021-04-07 ENCOUNTER — Ambulatory Visit (HOSPITAL_BASED_OUTPATIENT_CLINIC_OR_DEPARTMENT_OTHER): Payer: BC Managed Care – PPO | Admitting: Family

## 2021-04-07 ENCOUNTER — Encounter (HOSPITAL_BASED_OUTPATIENT_CLINIC_OR_DEPARTMENT_OTHER): Payer: Self-pay | Admitting: Family

## 2021-04-07 VITALS — BP 128/68 | HR 67 | Ht 61.0 in | Wt 182.6 lb

## 2021-04-07 DIAGNOSIS — R002 Palpitations: Secondary | ICD-10-CM | POA: Diagnosis not present

## 2021-04-07 DIAGNOSIS — E782 Mixed hyperlipidemia: Secondary | ICD-10-CM | POA: Diagnosis not present

## 2021-04-07 DIAGNOSIS — R0609 Other forms of dyspnea: Secondary | ICD-10-CM

## 2021-04-07 DIAGNOSIS — I10 Essential (primary) hypertension: Secondary | ICD-10-CM

## 2021-04-07 MED ORDER — METOPROLOL SUCCINATE ER 25 MG PO TB24: 25.0000 mg | ORAL_TABLET | Freq: Every day | ORAL | 3 refills | Status: DC

## 2021-04-07 MED ORDER — ATORVASTATIN CALCIUM 20 MG PO TABS: 20.0000 mg | ORAL_TABLET | Freq: Every day | ORAL | 3 refills | Status: DC

## 2021-04-07 NOTE — Patient Instructions (Addendum)
Medication Instructions:  Your Physician recommend you continue on your current medication as directed.    *If you need a refill on your cardiac medications before your next appointment, please call your pharmacy*   Lab Work: None ordered today    Testing/Procedures: None ordered today    Follow-Up: At [facility], you and your health needs are our priority.  As part of our continuing mission to provide you with exceptional heart care, we have created designated Provider Care Teams.  These Care Teams include your primary Cardiologist (physician) and Advanced Practice Providers (APPs -  Physician Assistants and Nurse Practitioners) who all work together to provide you with the care you need, when you need it.  We recommend signing up for the patient portal called "MyChart".  Sign up information is provided on this After Visit Summary.  MyChart is used to connect with patients for Virtual Visits (Telemedicine).  Patients are able to view lab/test results, encounter notes, upcoming appointments, etc.  Non-urgent messages can be sent to your provider as well.   To learn more about what you can do with MyChart, go to [email or web address].    Your next appointment:   May as scheduled with Dr. Oval Linsey   The format for your next appointment:   In Person  Provider:   Skeet Latch, MD    Other Instructions Heart Healthy Diet Recommendations: A low-salt diet is recommended. Meats should be grilled, baked, or boiled. Avoid fried foods. Focus on lean protein sources like fish or chicken with vegetables and fruits. The American Heart Association is a Microbiologist!  American Heart Association Diet and Lifeystyle Recommendations    Exercise recommendations: The American Heart Association recommends 150 minutes of moderate intensity exercise weekly. Try 30 minutes of moderate intensity exercise 4-5 times per week. This could include walking, jogging, or swimming.   To prevent  palpitations: Make sure you are adequately hydrated.  Avoid and/or limit caffeine containing beverages like soda or tea. Exercise regularly.  Manage stress well. Some over the counter medications can cause palpitations such as Benadryl, AdvilPM, TylenolPM. Regular Advil or Tylenol do not cause palpitations.

## 2021-04-07 NOTE — Progress Notes (Signed)
Office Visit    Patient Name: Susan Cordova Date of Encounter: 04/07/2021  PCP:  Donald Prose, [place]  Cardiologist:  Skeet Latch, MD  Advanced Practice Provider:  No care team member to display Electrophysiologist:  None      Chief Complaint    Susan Cordova is a 50 y.o. female with a hx of dyspnea, palpitations, hypertension presents today for follow-up after cardiac CTA  Past Medical History    Past Medical History:  Diagnosis Date   Abnormal menstrual cycle 07/27/10   Anemia    Anxiety    ASCUS with positive high risk HPV 07/14/09   Bladder cancer ([place])    Cancer ([place]) 2008   Bladder   Candidal vaginitis 04/06/08   Depression    Dysrhythmia    Essential hypertension 03/03/2021   Exertional dyspnea 03/03/2021   H/O rubella    H/O varicella    Heart murmur    History of varicose veins    Low iron    Mitral valve prolapse    Mitral valve prolapse 05/03/2015   Palpitations 05/03/2015   Past Surgical History:  Procedure Laterality Date   BLADDER SURGERY  2008   cancer - no chemo, no urostomy   CERVICAL BIOPSY  W/ LOOP ELECTRODE EXCISION     COLPOSCOPY     CYSTOSCOPY     LEEP     LEEP  11/24/2010   Procedure: LOOP ELECTROSURGICAL EXCISION PROCEDURE (LEEP);  Surgeon: Betsy Coder, MD;  Location: [facility];  Service: Gynecology;  Laterality: N/A;   tubes in ear   [place]    Allergies  No Known Allergies  History of Present Illness    Susan Cordova is a 50 y.o. female with a hx of dyspnea, palpitations, hypertension last seen 03/03/2021.  Initially evaluated in 2017 for palpitations.  Ambulatory monitoring revealed some PACs and PVCs.  Echocardiogram with LVEF 55 to 60%, no mitral valve prolapse, PFO not able to be excluded.  She was started on metoprolol for palpitations.  Echocardiogram 04/2020 at [facility] with LVEF 60% trace MR.  She was seen 03/03/2021 noting increasing palpitations, elevated  blood pressure at home, increased dyspnea on exertion.  Coronary CTA was ordered which was performed and showed a coronary calcium score of 0.  There were no acute or significant extracardiac abnormalities  She presents today for follow-up. 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 She had brief episode of chest pain yesterday lasting only seconds and occurred at rest. We reviewed coronary CTA and she is reassure by the results. Tells me her palpitations have dramatically improved.  EKGs/Labs/Other Studies Reviewed:   The following studies were reviewed today:  Cardiac CT 03/21/21 Aorta: Normal size.  No calcifications.  No dissection.   Aortic Valve:  Trileaflet.  No calcifications.   Coronary Arteries:  Normal coronary origin.  Right dominance.   RCA is a large dominant artery that gives rise to PDA and PLA. There is no plaque.   Left main is a large artery that gives rise to LAD and LCX arteries.   LAD is a large vessel that has no plaque.   LCX is a non-dominant artery that gives rise to one large OM1 branch. There is no plaque.   Coronary Calcium Score:   Left main: 0   Left anterior descending artery: 0   Left circumflex artery: 0   Right  coronary artery: 0   Total: 0   Percentile: 0   Other findings:   Normal pulmonary vein drainage into the left atrium.   Normal left atrial appendage without a thrombus.   Normal size of the pulmonary artery.   IMPRESSION: 1. Coronary calcium score of 0. This was 0 percentile for age and sex matched control.   2. Normal coronary origin with right dominance.   3. No evidence of CAD. CAD-RADS 0. No evidence of CAD (0%). Consider non-atherosclerotic causes of chest pain.   Echo 2/30/17: Study Conclusions   - Left ventricle: The cavity size was normal. Systolic function was    normal. The estimated ejection fraction was in the range of 55%    to 60%. Wall motion was normal; there were no  regional wall    motion abnormalities.  - Mitral valve: No significant prolapse noted.  - Atrial septum: A patent foramen ovale cannot be excluded.  - Impressions: Normal GLS -20.6.   Impressions:   - Normal GLS -20.6.    48 Hour Holter Monitor 05/04/15:   Quality: Fair.  Baseline artifact. Predominant rhythm: sinus rhythm and sinus arrhythmia Average heart rate: 67 bpm Max heart rate: 125 bpm Min heart rate: 46 bpm Pauses >2.5 seconds: 0 1 PVC 19 PACs were noted.   Patient did not submit a symptom diary  EKG:  No EKG today.   Recent Labs: 03/07/2021: BUN 9; Creatinine, Ser 0.77; Potassium 4.3; Sodium 143  Recent Lipid Panel    Component Value Date/Time   CHOL 193 12/06/2016 0923   TRIG 88 12/06/2016 0923   HDL 49 12/06/2016 0923   CHOLHDL 3.9 12/06/2016 0923   CHOLHDL 4.4 12/05/2015 0849   VLDL 16 12/05/2015 0849   LDLCALC 126 (H) 12/06/2016 0923     Home Medications   No outpatient medications have been marked as taking for the 04/07/21 encounter (Appointment) with Loel Dubonnet, NP.     Review of Systems      All other systems reviewed and are otherwise negative except as noted above.  Physical Exam    VS:  LMP 03/30/2021  , BMI There is no height or weight on file to calculate BMI.  Wt Readings from Last 3 Encounters:  04/04/21 180 lb (81.6 kg)  03/03/21 176 lb 3.2 oz (79.9 kg)  09/03/19 162 lb (73.5 kg)     GEN: Well nourished, well developed, in no acute distress. HEENT: normal. Neck: Supple, no JVD, carotid bruits, or masses. Cardiac: RRR, no murmurs, rubs, or gallops. No clubbing, cyanosis, edema.  Radials/PT 2+ and equal bilaterally.  Respiratory:  Respirations regular and unlabored, clear to auscultation bilaterally. GI: Soft, nontender, nondistended. MS: No deformity or atrophy. Skin: Warm and dry, no rash. Neuro:  Strength and sensation are intact. Psych: Normal affect.  Assessment & Plan    Hypertension - BP well controlled.  Continue current antihypertensive regimen.  She will continue to monitor 2-3 times per week and contact our office if BP consistently >130/80.   Exertional dyspnea - Echo 04/2020 normal LVEF. 02/2021 coronary calcium score of 0. Reports improvement in exertional dyspnea. Continues to walk regularly for exercise. No indication for further cardiac workup.  Palpitations - well controlled on current dose Metoprolol. Refill provided.   HLD - 11/2020 LDL 62. Continue Atorvastatin 20mg  QD. Refill provided. No myalgias.  Disposition: Follow up in 6 month(s) with Skeet Latch, MD or APP.  Signed, Loel Dubonnet, NP 04/07/2021, 2:25 PM [facility] ° °

## 2021-04-26 DIAGNOSIS — F331 Major depressive disorder, recurrent, moderate: Secondary | ICD-10-CM | POA: Diagnosis not present

## 2021-05-03 DIAGNOSIS — G4733 Obstructive sleep apnea (adult) (pediatric): Secondary | ICD-10-CM | POA: Diagnosis not present

## 2021-05-08 DIAGNOSIS — F331 Major depressive disorder, recurrent, moderate: Secondary | ICD-10-CM | POA: Diagnosis not present

## 2021-05-24 DIAGNOSIS — F331 Major depressive disorder, recurrent, moderate: Secondary | ICD-10-CM | POA: Diagnosis not present

## 2021-07-03 DIAGNOSIS — F411 Generalized anxiety disorder: Secondary | ICD-10-CM | POA: Diagnosis not present

## 2021-07-14 DIAGNOSIS — Z1211 Encounter for screening for malignant neoplasm of colon: Secondary | ICD-10-CM | POA: Diagnosis not present

## 2021-07-14 DIAGNOSIS — K635 Polyp of colon: Secondary | ICD-10-CM | POA: Diagnosis not present

## 2021-07-14 LAB — HM COLONOSCOPY

## 2021-07-19 DIAGNOSIS — F411 Generalized anxiety disorder: Secondary | ICD-10-CM | POA: Diagnosis not present

## 2021-08-01 DIAGNOSIS — F411 Generalized anxiety disorder: Secondary | ICD-10-CM | POA: Diagnosis not present

## 2021-08-08 ENCOUNTER — Encounter (HOSPITAL_BASED_OUTPATIENT_CLINIC_OR_DEPARTMENT_OTHER): Payer: Self-pay | Admitting: Cardiovascular Disease

## 2021-08-08 ENCOUNTER — Telehealth (HOSPITAL_BASED_OUTPATIENT_CLINIC_OR_DEPARTMENT_OTHER): Payer: Self-pay | Admitting: *Deleted

## 2021-08-08 NOTE — Telephone Encounter (Signed)
Below message received via mychart ? ?Hi, Dr. Oval Linsey- ?I'm scheduled to come in for a follow-up check-in visit, but my symptoms have essentially disappeared so I'm wondering if I still need to come in since my test results were fine.  Please advise. ?  ?Thanks! ?  ?Susan Cordova ? ?Will forward to Dr Oval Linsey for review  ?

## 2021-08-08 NOTE — Telephone Encounter (Signed)
Please advise 

## 2021-08-09 NOTE — Telephone Encounter (Signed)
Mychart message sent to patient and cancelled follow up in May  ?

## 2021-08-14 DIAGNOSIS — F411 Generalized anxiety disorder: Secondary | ICD-10-CM | POA: Diagnosis not present

## 2021-08-28 ENCOUNTER — Ambulatory Visit (HOSPITAL_BASED_OUTPATIENT_CLINIC_OR_DEPARTMENT_OTHER): Payer: BC Managed Care – PPO | Admitting: Cardiovascular Disease

## 2021-08-28 DIAGNOSIS — F411 Generalized anxiety disorder: Secondary | ICD-10-CM | POA: Diagnosis not present

## 2021-09-11 ENCOUNTER — Ambulatory Visit (HOSPITAL_BASED_OUTPATIENT_CLINIC_OR_DEPARTMENT_OTHER): Payer: BC Managed Care – PPO | Admitting: Cardiovascular Disease

## 2021-09-18 DIAGNOSIS — F411 Generalized anxiety disorder: Secondary | ICD-10-CM | POA: Diagnosis not present

## 2021-10-04 DIAGNOSIS — F411 Generalized anxiety disorder: Secondary | ICD-10-CM | POA: Diagnosis not present

## 2021-10-11 DIAGNOSIS — F411 Generalized anxiety disorder: Secondary | ICD-10-CM | POA: Diagnosis not present

## 2021-11-15 ENCOUNTER — Other Ambulatory Visit: Payer: Self-pay | Admitting: Family Medicine

## 2021-11-15 DIAGNOSIS — Z1231 Encounter for screening mammogram for malignant neoplasm of breast: Secondary | ICD-10-CM

## 2021-11-16 DIAGNOSIS — F411 Generalized anxiety disorder: Secondary | ICD-10-CM | POA: Diagnosis not present

## 2021-11-27 DIAGNOSIS — F411 Generalized anxiety disorder: Secondary | ICD-10-CM | POA: Diagnosis not present

## 2021-11-29 DIAGNOSIS — D2271 Melanocytic nevi of right lower limb, including hip: Secondary | ICD-10-CM | POA: Diagnosis not present

## 2021-11-29 DIAGNOSIS — D2261 Melanocytic nevi of right upper limb, including shoulder: Secondary | ICD-10-CM | POA: Diagnosis not present

## 2021-11-29 DIAGNOSIS — D2272 Melanocytic nevi of left lower limb, including hip: Secondary | ICD-10-CM | POA: Diagnosis not present

## 2021-11-29 DIAGNOSIS — D2262 Melanocytic nevi of left upper limb, including shoulder: Secondary | ICD-10-CM | POA: Diagnosis not present

## 2021-12-01 ENCOUNTER — Encounter (HOSPITAL_BASED_OUTPATIENT_CLINIC_OR_DEPARTMENT_OTHER): Payer: Self-pay | Admitting: Cardiovascular Disease

## 2021-12-01 DIAGNOSIS — E785 Hyperlipidemia, unspecified: Secondary | ICD-10-CM | POA: Diagnosis not present

## 2021-12-01 DIAGNOSIS — I10 Essential (primary) hypertension: Secondary | ICD-10-CM | POA: Diagnosis not present

## 2021-12-01 DIAGNOSIS — Z0000 Encounter for general adult medical examination without abnormal findings: Secondary | ICD-10-CM | POA: Diagnosis not present

## 2021-12-13 DIAGNOSIS — F411 Generalized anxiety disorder: Secondary | ICD-10-CM | POA: Diagnosis not present

## 2022-01-01 ENCOUNTER — Ambulatory Visit: Payer: BC Managed Care – PPO

## 2022-01-04 DIAGNOSIS — G4733 Obstructive sleep apnea (adult) (pediatric): Secondary | ICD-10-CM | POA: Diagnosis not present

## 2022-02-28 ENCOUNTER — Ambulatory Visit (HOSPITAL_BASED_OUTPATIENT_CLINIC_OR_DEPARTMENT_OTHER): Payer: BC Managed Care – PPO | Admitting: Cardiovascular Disease

## 2022-03-08 DIAGNOSIS — S80862A Insect bite (nonvenomous), left lower leg, initial encounter: Secondary | ICD-10-CM | POA: Diagnosis not present

## 2022-03-08 DIAGNOSIS — W57XXXA Bitten or stung by nonvenomous insect and other nonvenomous arthropods, initial encounter: Secondary | ICD-10-CM | POA: Diagnosis not present

## 2022-03-13 DIAGNOSIS — F411 Generalized anxiety disorder: Secondary | ICD-10-CM | POA: Diagnosis not present

## 2022-03-14 DIAGNOSIS — F411 Generalized anxiety disorder: Secondary | ICD-10-CM | POA: Diagnosis not present

## 2022-03-19 ENCOUNTER — Other Ambulatory Visit: Payer: Self-pay | Admitting: Family Medicine

## 2022-03-19 DIAGNOSIS — Z1231 Encounter for screening mammogram for malignant neoplasm of breast: Secondary | ICD-10-CM

## 2022-03-21 DIAGNOSIS — F411 Generalized anxiety disorder: Secondary | ICD-10-CM | POA: Diagnosis not present

## 2022-03-28 DIAGNOSIS — F411 Generalized anxiety disorder: Secondary | ICD-10-CM | POA: Diagnosis not present

## 2022-03-28 NOTE — Progress Notes (Signed)
51 y.o. G0P0000 Single Unavailable Unavailable female here for annual exam.  She had one skipped cycle this fall, then had  Period Cycle (Days): 28 Period Duration (Days): 5 Period Pattern: Regular Menstrual Flow: Moderate Menstrual Control:  (cup) Menstrual Control Change Freq (Hours): 6 Dysmenorrhea: None She is having night sweats ~ 1 x a night. No hot flashes, but warm all of the time.   Her Father died in 01/16/23, she is managing the estate.   H/O bladder cancer in 2008. No longer seeing Urology.  Patient's last menstrual period was 03/19/2022.          Sexually active: No.  The current method of family planning is abstinence.    Exercising: Yes.     Walking  Smoker:  no  Health Maintenance: Pap:04/29/19 negative, negative hpv; 02/05/2017 negative with negative HR HPV; 01-23-16 ASCUS NEG HR HPV  History of abnormal Pap:   Yes 06/2009 ASCUS + HR HPV Leep in 2012  MMG:  12/28/20 density B Bi-rads 1 neg has scheduled for 05/15/22  BMD:   n/a  Colonoscopy: 06/2021 normal  TDaP:   09/12/12  Gardasil: n/a   reports that she has never smoked. She has never used smokeless tobacco. She reports current alcohol use. She reports that she does not use drugs. 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    Past Medical History:  Diagnosis Date   Abnormal menstrual cycle 07/27/10   Anemia    Anxiety    ASCUS with positive high risk HPV 07/14/09   Bladder cancer ([place])    Cancer ([place]) 2008   Bladder   Candidal vaginitis 04/06/08   Depression    Dysrhythmia    Essential hypertension 03/03/2021   Exertional dyspnea 03/03/2021   H/O rubella    H/O varicella    Heart murmur    History of varicose veins    Low iron    Mitral valve prolapse    Mitral valve prolapse 05/03/2015   Palpitations 05/03/2015    Past Surgical History:  Procedure Laterality Date   BLADDER SURGERY  2008   cancer - no chemo, no urostomy   CERVICAL BIOPSY  W/  LOOP ELECTRODE EXCISION     COLPOSCOPY     CYSTOSCOPY     LEEP     LEEP  11/24/2010   Procedure: LOOP ELECTROSURGICAL EXCISION PROCEDURE (LEEP);  Surgeon: Betsy Coder, MD;  Location: [facility];  Service: Gynecology;  Laterality: N/A;   tubes in ear   [place]    Current Outpatient Medications  Medication Sig Dispense Refill   atorvastatin (LIPITOR) 20 MG tablet Take 1 tablet (20 mg total) by mouth daily. 90 tablet 3   Cholecalciferol (VITAMIN D) 50 MCG (2000 UT) tablet Take 1 tablet by mouth daily.     FLUoxetine (PROZAC) 10 MG capsule      hydrOXYzine (VISTARIL) 25 MG capsule 25 mg.     meclizine (ANTIVERT) 25 MG tablet Take 1 tablet by mouth as needed.     metoprolol succinate (TOPROL-XL) 25 MG 24 hr tablet Take 1 tablet (25 mg total) by mouth daily. 90 tablet 3   No current facility-administered medications for this visit.    Family History  Problem Relation Age of Onset   Cancer Mother 39       Breast   Hypertension Mother    Breast cancer Mother 8  Lung cancer Mother        died at 13   Hyperlipidemia Father    Arrhythmia Father        atrial fibrillation   Cancer Maternal Grandmother 80       breast   Breast cancer Maternal Grandmother 75   Kidney disease Maternal Grandfather    Dementia Paternal Grandmother 56   Cancer Paternal Grandfather 78       colon cancer; skin cancer   Colon cancer Paternal Grandfather    Heart disease Paternal Grandfather    Skin cancer Paternal Grandfather   Mom died of lung cancer, dad died of lung cancer.   Review of Systems  All other systems reviewed and are negative.   Exam:   BP 132/72   Pulse 89   Ht '5\' 1"'$  (1.549 m)   Wt 179 lb (81.2 kg)   LMP 03/19/2022   SpO2 98%   BMI 33.82 kg/m   Weight change: '@WEIGHTCHANGE'$ @ Height:   Height: '5\' 1"'$  (154.9 cm)  Ht Readings from Last 3 Encounters:  04/09/22 '5\' 1"'$  (1.549 m)  04/07/21 '5\' 1"'$  (1.549 m)  04/04/21 '5\' 1"'$  (1.549 m)    General appearance:  alert, cooperative and appears stated age Head: Normocephalic, without obvious abnormality, atraumatic Neck: no adenopathy, supple, symmetrical, trachea midline and thyroid normal to inspection and palpation Lungs: clear to auscultation bilaterally Cardiovascular: regular rate and rhythm Breasts: normal appearance, no masses or tenderness Abdomen: soft, non-tender; non distended,  no masses,  no organomegaly Extremities: extremities normal, atraumatic, no cyanosis or edema Skin: Skin color, texture, turgor normal. No rashes or lesions Lymph nodes: Cervical, supraclavicular, and axillary nodes normal. No abnormal inguinal nodes palpated Neurologic: Grossly normal   Pelvic: External genitalia:  no lesions              Urethra:  normal appearing urethra with no masses, tenderness or lesions              Bartholins and Skenes: normal                 Vagina: normal appearing vagina with normal color and discharge, no lesions              Cervix: no lesions, friable with pap               Bimanual Exam:  Uterus:  normal size, contour, position, consistency, mobility, non-tender and retroverted              Adnexa: no mass, fullness, tenderness               Rectovaginal: Confirms               Anus:  normal sphincter tone, no lesions  Gae Dry, CMA chaperoned for the exam.  1. Well woman exam Discussed breast self exam Discussed calcium and vit D intake Labs with primary  2. Screening for cervical cancer - Cytology - PAP  3. History of bladder cancer - Urinalysis, Complete  4. Irregular menses C/W perimenopause - medroxyPROGESTERone (PROVERA) 5 MG tablet; Take one tablet a day for 5 days every 1-2 months if no spontaneous menses.  Dispense: 15 tablet; Refill: 3 -She had a normal TSH with her primary

## 2022-04-05 DIAGNOSIS — F411 Generalized anxiety disorder: Secondary | ICD-10-CM | POA: Diagnosis not present

## 2022-04-09 ENCOUNTER — Encounter: Payer: Self-pay | Admitting: Obstetrics and Gynecology

## 2022-04-09 ENCOUNTER — Ambulatory Visit (INDEPENDENT_AMBULATORY_CARE_PROVIDER_SITE_OTHER): Payer: BC Managed Care – PPO | Admitting: Obstetrics and Gynecology

## 2022-04-09 ENCOUNTER — Other Ambulatory Visit (HOSPITAL_COMMUNITY)
Admission: RE | Admit: 2022-04-09 | Discharge: 2022-04-09 | Disposition: A | Discharge status: Home / Self Care | Payer: BC Managed Care – PPO | Source: Ambulatory Visit | Attending: Obstetrics and Gynecology | Admitting: Obstetrics and Gynecology

## 2022-04-09 VITALS — BP 132/72 | HR 89 | Ht 61.0 in | Wt 179.0 lb

## 2022-04-09 DIAGNOSIS — Z8551 Personal history of malignant neoplasm of bladder: Secondary | ICD-10-CM

## 2022-04-09 DIAGNOSIS — Z01419 Encounter for gynecological examination (general) (routine) without abnormal findings: Secondary | ICD-10-CM

## 2022-04-09 DIAGNOSIS — Z124 Encounter for screening for malignant neoplasm of cervix: Secondary | ICD-10-CM

## 2022-04-09 DIAGNOSIS — N926 Irregular menstruation, unspecified: Secondary | ICD-10-CM

## 2022-04-09 LAB — URINALYSIS, COMPLETE
Bacteria, UA: NONE SEEN /HPF
Bilirubin Urine: NEGATIVE
Casts: NONE SEEN /LPF
Crystals: NONE SEEN /HPF
Glucose, UA: NEGATIVE
Hgb urine dipstick: NEGATIVE
Hyaline Cast: NONE SEEN /LPF
Ketones, ur: NEGATIVE
Leukocytes,Ua: NEGATIVE
Nitrite: NEGATIVE
Protein, ur: NEGATIVE
RBC / HPF: NONE SEEN /HPF (ref 0–2)
Specific Gravity, Urine: 1.004 (ref 1.001–1.035)
WBC, UA: NONE SEEN /HPF (ref 0–5)
Yeast: NONE SEEN /HPF
pH: 6.5 (ref 5.0–8.0)

## 2022-04-09 MED ORDER — MEDROXYPROGESTERONE ACETATE 5 MG PO TABS: ORAL_TABLET | ORAL | 3 refills | Status: AC

## 2022-04-09 NOTE — Patient Instructions (Signed)
Kegel Exercises  Kegel exercises can help strengthen your pelvic floor muscles. The pelvic floor is a group of muscles that support your rectum, small intestine, and bladder. In females, pelvic floor muscles also help support the uterus. These muscles help you control the flow of urine and stool (feces). Kegel exercises are painless and simple. They do not require any equipment. Your provider may suggest Kegel exercises to: Improve bladder and bowel control. Improve sexual response. Improve weak pelvic floor muscles after surgery to remove the uterus (hysterectomy) or after pregnancy, in females. Improve weak pelvic floor muscles after prostate gland removal or surgery, in males. Kegel exercises involve squeezing your pelvic floor muscles. These are the same muscles you squeeze when you try to stop the flow of urine or keep from passing gas. The exercises can be done while sitting, standing, or lying down, but it is best to vary your position. Ask your health care provider which exercises are safe for you. Do exercises exactly as told by your health care provider and adjust them as directed. Do not begin these exercises until told by your health care provider. Exercises How to do Kegel exercises: Squeeze your pelvic floor muscles tight. You should feel a tight lift in your rectal area. If you are a female, you should also feel a tightness in your vaginal area. Keep your stomach, buttocks, and legs relaxed. Hold the muscles tight for up to 10 seconds. Breathe normally. Relax your muscles for up to 10 seconds. Repeat as told by your health care provider. Repeat this exercise daily as told by your health care provider. Continue to do this exercise for at least 4-6 weeks, or for as long as told by your health care provider. You may be referred to a physical therapist who can help you learn more about how to do Kegel exercises. Depending on your condition, your health care provider may  recommend: Varying how long you squeeze your muscles. Doing several sets of exercises every day. Doing exercises for several weeks. Making Kegel exercises a part of your regular exercise routine. This information is not intended to replace advice given to you by your health care provider. Make sure you discuss any questions you have with your health care provider.  EXERCISE   We recommended that you start or continue a regular exercise program for good health. Physical activity is anything that gets your body moving, some is better than none. The CDC recommends 150 minutes per week of Moderate-Intensity Aerobic Activity and 2 or more days of Muscle Strengthening Activity.  Benefits of exercise are limitless: helps weight loss/weight maintenance, improves mood and energy, helps with depression and anxiety, improves sleep, tones and strengthens muscles, improves balance, improves bone density, protects from chronic conditions such as heart disease, high blood pressure and diabetes and so much more. To learn more visit: WhyNotPoker.uy  DIET: Good nutrition starts with a healthy diet of fruits, vegetables, whole grains, and lean protein sources. Drink plenty of water for hydration. Minimize empty calories, sodium, sweets. For more information about dietary recommendations visit: GeekRegister.com.ee and http://schaefer-mitchell.com/  ALCOHOL:  Women should limit their alcohol intake to no more than 7 drinks/beers/glasses of wine (combined, not each!) per week. Moderation of alcohol intake to this level decreases your risk of breast cancer and liver damage.  If you are concerned that you may have a problem, or your friends have told you they are concerned about your drinking, there are many resources to help. A well-known program that is  free, effective, and available to all people all over the nation is Alcoholics Anonymous.   Check out this site to learn more: BlockTaxes.se   CALCIUM AND VITAMIN D:  Adequate intake of calcium and Vitamin D are recommended for bone health.  You should be getting between 1000-1200 mg of calcium and 800 units of Vitamin D daily between diet and supplements  PAP SMEARS:  Pap smears, to check for cervical cancer or precancers,  have traditionally been done yearly, scientific advances have shown that most women can have pap smears less often.  However, every woman still should have a physical exam from her gynecologist every year. It will include a breast check, inspection of the vulva and vagina to check for abnormal growths or skin changes, a visual exam of the cervix, and then an exam to evaluate the size and shape of the uterus and ovaries. We will also provide age appropriate advice regarding health maintenance, like when you should have certain vaccines, screening for sexually transmitted diseases, bone density testing, colonoscopy, mammograms, etc.   MAMMOGRAMS:  All women over 52 years old should have a routine mammogram.   COLON CANCER SCREENING: Now recommend starting at age 2. At this time colonoscopy is not covered for routine screening until 50. There are take home tests that can be done between 45-49.   COLONOSCOPY:  Colonoscopy to screen for colon cancer is recommended for all women at age 66.  We know, you hate the idea of the prep.  We agree, BUT, having colon cancer and not knowing it is worse!!  Colon cancer so often starts as a polyp that can be seen and removed at colonscopy, which can quite literally save your life!  And if your first colonoscopy is normal and you have no family history of colon cancer, most women don't have to have it again for 10 years.  Once every ten years, you can do something that may end up saving your life, right?  We will be happy to help you get it scheduled when you are ready.  Be sure to check your insurance coverage so you understand how  much it will cost.  It may be covered as a preventative service at no cost, but you should check your particular policy.      Breast Self-Awareness Breast self-awareness means being familiar with how your breasts look and feel. It involves checking your breasts regularly and reporting any changes to your health care provider. Practicing breast self-awareness is important. A change in your breasts can be a sign of a serious medical problem. Being familiar with how your breasts look and feel allows you to find any problems early, when treatment is more likely to be successful. All women should practice breast self-awareness, including women who have had breast implants. How to do a breast self-exam One way to learn what is normal for your breasts and whether your breasts are changing is to do a breast self-exam. To do a breast self-exam: Look for Changes  Remove all the clothing above your waist. Stand in front of a mirror in a room with good lighting. Put your hands on your hips. Push your hands firmly downward. Compare your breasts in the mirror. Look for differences between them (asymmetry), such as: Differences in shape. Differences in size. Puckers, dips, and bumps in one breast and not the other. Look at each breast for changes in your skin, such as: Redness. Scaly areas. Look for changes in your nipples, such as:  Discharge. Bleeding. Dimpling. Redness. A change in position. Feel for Changes Carefully feel your breasts for lumps and changes. It is best to do this while lying on your back on the floor and again while sitting or standing in the shower or tub with soapy water on your skin. Feel each breast in the following way: Place the arm on the side of the breast you are examining above your head. Feel your breast with the other hand. Start in the nipple area and make  inch (2 cm) overlapping circles to feel your breast. Use the pads of your three middle fingers to do this.  Apply light pressure, then medium pressure, then firm pressure. The light pressure will allow you to feel the tissue closest to the skin. The medium pressure will allow you to feel the tissue that is a little deeper. The firm pressure will allow you to feel the tissue close to the ribs. Continue the overlapping circles, moving downward over the breast until you feel your ribs below your breast. Move one finger-width toward the center of the body. Continue to use the  inch (2 cm) overlapping circles to feel your breast as you move slowly up toward your collarbone. Continue the up and down exam using all three pressures until you reach your armpit.  Write Down What You Find  Write down what is normal for each breast and any changes that you find. Keep a written record with breast changes or normal findings for each breast. By writing this information down, you do not need to depend only on memory for size, tenderness, or location. Write down where you are in your menstrual cycle, if you are still menstruating. If you are having trouble noticing differences in your breasts, do not get discouraged. With time you will become more familiar with the variations in your breasts and more comfortable with the exam. How often should I examine my breasts? Examine your breasts every month. If you are breastfeeding, the best time to examine your breasts is after a feeding or after using a breast pump. If you menstruate, the best time to examine your breasts is 5-7 days after your period is over. During your period, your breasts are lumpier, and it may be more difficult to notice changes. When should I see my health care provider? See your health care provider if you notice: A change in shape or size of your breasts or nipples. A change in the skin of your breast or nipples, such as a reddened or scaly area. Unusual discharge from your nipples. A lump or thick area that was not there before. Pain in your  breasts. Anything that concerns you.

## 2022-04-11 DIAGNOSIS — F411 Generalized anxiety disorder: Secondary | ICD-10-CM | POA: Diagnosis not present

## 2022-04-12 LAB — CYTOLOGY - PAP
Comment: NEGATIVE
Diagnosis: NEGATIVE
High risk HPV: NEGATIVE

## 2022-04-24 DIAGNOSIS — F411 Generalized anxiety disorder: Secondary | ICD-10-CM | POA: Diagnosis not present

## 2022-04-25 DIAGNOSIS — F411 Generalized anxiety disorder: Secondary | ICD-10-CM | POA: Diagnosis not present

## 2022-04-26 DIAGNOSIS — F411 Generalized anxiety disorder: Secondary | ICD-10-CM | POA: Diagnosis not present

## 2022-04-29 ENCOUNTER — Encounter (HOSPITAL_BASED_OUTPATIENT_CLINIC_OR_DEPARTMENT_OTHER): Payer: Self-pay | Admitting: Cardiovascular Disease

## 2022-04-30 MED ORDER — METOPROLOL SUCCINATE ER 25 MG PO TB24: 25.0000 mg | ORAL_TABLET | Freq: Every day | ORAL | 0 refills | Status: DC

## 2022-05-02 DIAGNOSIS — F411 Generalized anxiety disorder: Secondary | ICD-10-CM | POA: Diagnosis not present

## 2022-05-03 DIAGNOSIS — F411 Generalized anxiety disorder: Secondary | ICD-10-CM | POA: Diagnosis not present

## 2022-05-08 ENCOUNTER — Ambulatory Visit: Payer: BC Managed Care – PPO | Admitting: Physician Assistant

## 2022-05-08 ENCOUNTER — Encounter: Payer: Self-pay | Admitting: Physician Assistant

## 2022-05-08 VITALS — BP 136/80 | HR 55 | Temp 97.5°F | Ht 61.5 in | Wt 180.0 lb

## 2022-05-08 DIAGNOSIS — E782 Mixed hyperlipidemia: Secondary | ICD-10-CM | POA: Diagnosis not present

## 2022-05-08 DIAGNOSIS — G4733 Obstructive sleep apnea (adult) (pediatric): Secondary | ICD-10-CM | POA: Diagnosis not present

## 2022-05-08 DIAGNOSIS — Z809 Family history of malignant neoplasm, unspecified: Secondary | ICD-10-CM

## 2022-05-08 DIAGNOSIS — Z8551 Personal history of malignant neoplasm of bladder: Secondary | ICD-10-CM | POA: Insufficient documentation

## 2022-05-08 DIAGNOSIS — F4323 Adjustment disorder with mixed anxiety and depressed mood: Secondary | ICD-10-CM

## 2022-05-08 DIAGNOSIS — I10 Essential (primary) hypertension: Secondary | ICD-10-CM

## 2022-05-08 DIAGNOSIS — F411 Generalized anxiety disorder: Secondary | ICD-10-CM | POA: Diagnosis not present

## 2022-05-08 DIAGNOSIS — D500 Iron deficiency anemia secondary to blood loss (chronic): Secondary | ICD-10-CM

## 2022-05-08 NOTE — Patient Instructions (Signed)
It was great to see you!  Please plan to follow-up for a physical when you are due.  Take care,  Inda Coke PA-C

## 2022-05-08 NOTE — Progress Notes (Signed)
Susan Cordova is a 52 y.o. female here for a new problem.  History of Present Illness:   Chief Complaint  Patient presents with   Establish Care   Depression      HTN Currently taking metoprolol XL 25 mg . atient denies chest pain, SOB, blurred vision, dizziness, unusual headaches, lower leg swelling. Patient is compliant with medication. Denies excessive caffeine intake, stimulant usage, excessive alcohol intake, or increase in salt consumption.  BP Readings from Last 3 Encounters:  05/08/22 136/80  04/09/22 132/72  04/07/21 128/68   Sees Dr Skeet Latch yearly.  OSA Sees Dr Rexene Alberts Compliant with CPAP  HLD Currenlty taking lipitor 20 mg daily Tolerating well.  Depression and Anxiety Currently taking prozac 10 mg daily -- seeing psychiatrist When she went up to prozac 20 mg had significant gastric distress so is now down to prozac 10 mg Takes hydroxyzine 25 mg dailu  Anemia Takes iron with her periods when she remembers Has had issues with "borderline" anemia when donating blood Has never required infusion  History of bladder cancer Occurred in her 42's with sudden onset hematuria Had surgery and regular follow-up  Does not see urology  Family history of cancer See family history -- has significant family history of breast, lung, colon, skin cancer She has never had genetic counseling   Past Medical History:  Diagnosis Date   Abnormal menstrual cycle 07/27/2010   Anemia    Anxiety    ASCUS with positive high risk HPV 07/14/2009   Bladder cancer ([place])    Cancer ([place]) 2008   Bladder   Candidal vaginitis 04/06/2008   Depression    Dysrhythmia    Essential hypertension 03/03/2021   Exertional dyspnea 03/03/2021   H/O rubella    H/O varicella    Heart murmur    History of varicose veins    Hyperlipidemia    Low iron    Mitral valve prolapse    Mitral valve prolapse 05/03/2015   Palpitations 05/03/2015   Sleep apnea      Social History    Tobacco Use   Smoking status: Never   Smokeless tobacco: Never  Vaping Use   Vaping Use: Never used  Substance Use Topics   Alcohol use: Yes    Comment: 4 shots of liquir per year   Drug use: No    Past Surgical History:  Procedure Laterality Date   BLADDER SURGERY  2008   cancer - no chemo, no urostomy   CERVICAL BIOPSY  W/ LOOP ELECTRODE EXCISION     COLPOSCOPY     CYSTOSCOPY     LEEP     LEEP  11/24/2010   Procedure: LOOP ELECTROSURGICAL EXCISION PROCEDURE (LEEP);  Surgeon: Betsy Coder, MD;  Location: [facility];  Service: Gynecology;  Laterality: N/A;   tubes in ear   [place]    Family History  Problem Relation Age of Onset   Cancer Mother 58       Breast   Hypertension Mother    Breast cancer Mother 48   Lung cancer Mother        died at 64   Hyperlipidemia Father    Arrhythmia Father        atrial fibrillation   Cancer Maternal Grandmother 80       breast   Breast cancer Maternal Grandmother 75   Kidney disease Maternal Grandfather    Dementia Paternal Grandmother 40   Cancer Paternal Grandfather  23       colon cancer; skin cancer   Colon cancer Paternal Grandfather    Heart disease Paternal Grandfather    Skin cancer Paternal Grandfather     No Known Allergies  Current Medications:   Current Outpatient Medications:    atorvastatin (LIPITOR) 20 MG tablet, Take 1 tablet (20 mg total) by mouth daily., Disp: 90 tablet, Rfl: 3   Cholecalciferol (VITAMIN D) 50 MCG (2000 UT) tablet, Take 1 tablet by mouth daily., Disp: , Rfl:    Ferrous Fumarate (IRON) 18 MG TBCR, Take by mouth. 4-5 x's per month with menstrual cycle, Disp: , Rfl:    FLUoxetine (PROZAC) 10 MG capsule, , Disp: , Rfl:    hydrOXYzine (VISTARIL) 25 MG capsule, 25 mg as needed., Disp: , Rfl:    meclizine (ANTIVERT) 25 MG tablet, Take 1 tablet by mouth as needed., Disp: , Rfl:    metoprolol succinate (TOPROL-XL) 25 MG 24 hr tablet, Take 1 tablet (25 mg total) by  mouth daily., Disp: 15 tablet, Rfl: 0   medroxyPROGESTERone (PROVERA) 5 MG tablet, Take one tablet a day for 5 days every 1-2 months if no spontaneous menses. (Patient not taking: Reported on 05/08/2022), Disp: 15 tablet, Rfl: 3   Review of Systems:   Review of Systems  Psychiatric/Behavioral:  Positive for depression.    Negative unless otherwise specified per HPI.  Vitals:   Vitals:   05/08/22 1357  BP: 136/80  Pulse: (!) 55  Temp: (!) 97.5 F (36.4 C)  TempSrc: Temporal  SpO2: 98%  Weight: 180 lb (81.6 kg)  Height: 5' 1.5" (1.562 m)     Body mass index is 33.46 kg/m.  Physical Exam:   Physical Exam Vitals and nursing note reviewed.  Constitutional:      General: She is not in acute distress.    Appearance: She is well-developed. She is not ill-appearing or toxic-appearing.  Cardiovascular:     Rate and Rhythm: Normal rate and regular rhythm.     Pulses: Normal pulses.     Heart sounds: Normal heart sounds, S1 normal and S2 normal.  Pulmonary:     Effort: Pulmonary effort is normal.     Breath sounds: Normal breath sounds.  Skin:    General: Skin is warm and dry.  Neurological:     Mental Status: She is alert.     GCS: GCS eye subscore is 4. GCS verbal subscore is 5. GCS motor subscore is 6.  Psychiatric:        Speech: Speech normal.        Behavior: Behavior normal. Behavior is cooperative.     Assessment and Plan:   Essential hypertension Well controlled Normotensive Continue metoprolol succinate 25 mg daily Follow-up with cardiology per her request  History of bladder cancer Reviewed Most recent UA without concerns  OSA (obstructive sleep apnea) Compliant w/ CPAP Sees Dr Rexene Alberts  Mixed hyperlipidemia Most recent lipid panel reviewed Continue lipitor 20 mg daily  Iron deficiency anemia due to chronic blood loss Reviewed Most recent CBC shows normal Hgb Continue to monitor  Family history of cancer Offered genetic counseling Handout  provided She will reach out to me via MyChart if she would like to pursue this  Adjustment reaction with anxiety and depression Ongoing Denies SI/HI Mgmt per psychiatry and psychology  Inda Coke, PA-C

## 2022-05-15 ENCOUNTER — Ambulatory Visit
Admission: RE | Admit: 2022-05-15 | Discharge: 2022-05-15 | Disposition: A | Discharge status: Home / Self Care | Payer: BC Managed Care – PPO | Source: Ambulatory Visit | Attending: Family Medicine | Admitting: Family Medicine

## 2022-05-15 DIAGNOSIS — G4733 Obstructive sleep apnea (adult) (pediatric): Secondary | ICD-10-CM | POA: Diagnosis not present

## 2022-05-15 DIAGNOSIS — Z1231 Encounter for screening mammogram for malignant neoplasm of breast: Secondary | ICD-10-CM

## 2022-05-16 DIAGNOSIS — F411 Generalized anxiety disorder: Secondary | ICD-10-CM | POA: Diagnosis not present

## 2022-05-21 NOTE — Progress Notes (Signed)
Cardiology Office Note   Date:  05/21/2022   ID:  Susan Cordova, DOB [birth year], MRN [id number]  PCP:  Inda Coke, [place]  Cardiologist:   Danford Bad   No chief complaint on file.      History of Present Illness: Susan Cordova is a 52 y.o. female with mitral valve prolapse who presents to reestablish care.  She was first seen in 2017 for an evaluation of palpitations.  [name] noted palpitations for the preceding 2 years.  Episodes occurred once per day or once every other day and lasted for 1-2 seconds at a time.  She wore an ambulatory monitor that revealed some PACs and PVCs.  She had an echo with LVEF 55 to 60% and no mitral valve prolapse.  PFO cannot be excluded.  In January she was in [place] and was diagnosed with hypertension.  She also had a lot of palpitations and was started on metoprolol.  It initially helped but lately she has more palpitations.    On 03/03/21, she reported that she occasionally checked her BP at home and her BP had been as high as 150-170s. She reported lower readings in the AM. She was continued on Metoprolol 25 MG. She had a lot of palpitations and tightness in her chest at the time. Her heart rate that day was 61 on metoprolol 25 mg. Therefore, the dose could not be titrated, but pt could take extra half tablet prn. EKG showed sinus rhythm, Rate 61 bpm. She had noted that she is more short of breath with exertion. A coronary CTA was ordered to evaluate for CAD, which resulted a coronary calcium score of 0 and no evidence of CAD. She had labs checked 11/2020 and her hemoglobin, electrolytes, and TSH were all within normal limits.  She had an echo at [facility] 04/2020 with LVEF 60% and trace mitral regurgitation.  She occasionally checked her BP at home and her BP had been as high as 150-170s.  This occurred when she came home from work and was feeling poorly.  She had a lot of palpitations and tightness in her chest at the time.  She is unsure about whether  she rested for 5 minutes before checking her blood pressure.  It tends to be lower in the AM.  She has been walking 5 or 6 days/week for the last couple years.  Lately She had noted that she is more short of breath with exertion.  She is not had any major changes in her life regarding caffeine or stress.  She had labs checked 11/2020 and her hemoglobin, electrolytes, and TSH were all within normal limits.  She had an echo at [facility] 04/2020 with LVEF 60% and trace mitral regurgitation.  She followed-up with Loel Dubonnet, NP on 04/07/2021 after cardiac CTA. She reported a brief episode of chest pain at rest the day before, which lasted a few seconds. Her palpitations were well controlled on her Metoprolol dose. 11/2020 LDL 62. Atorvastatin '20mg'$  QD was continued.  She was seen by Inda Coke, [name] on 05/08/22 for FU and she was continued on metoprolol succinate 25 mg daily and lipitor 20 mg daily.  Today,   Past Medical History:  Diagnosis Date   Abnormal menstrual cycle 07/27/2010   Anemia    Anxiety    ASCUS with positive high risk HPV 07/14/2009   Cancer ([place]) 2008   Bladder   Depression    Dysrhythmia    Essential hypertension 03/03/2021  Exertional dyspnea 03/03/2021   H/O rubella    H/O varicella    Heart murmur    History of varicose veins    Hyperlipidemia    Low iron    Mitral valve prolapse 05/03/2015   Palpitations 05/03/2015   Sleep apnea     Past Surgical History:  Procedure Laterality Date   BLADDER SURGERY  2008   cancer - no chemo, no urostomy   CERVICAL BIOPSY  W/ LOOP ELECTRODE EXCISION     COLPOSCOPY     CYSTOSCOPY     LEEP     LEEP  11/24/2010   Procedure: LOOP ELECTROSURGICAL EXCISION PROCEDURE (LEEP);  Surgeon: Betsy Coder, MD;  Location: [facility];  Service: Gynecology;  Laterality: N/A;   tubes in ear   [place]     Current Outpatient Medications  Medication Sig Dispense Refill   atorvastatin (LIPITOR) 20 MG  tablet Take 1 tablet (20 mg total) by mouth daily. 90 tablet 3   Cholecalciferol (VITAMIN D) 50 MCG (2000 UT) tablet Take 1 tablet by mouth daily.     Ferrous Fumarate (IRON) 18 MG TBCR Take by mouth. 4-5 x's per month with menstrual cycle     FLUoxetine (PROZAC) 10 MG capsule      hydrOXYzine (VISTARIL) 25 MG capsule 25 mg as needed.     meclizine (ANTIVERT) 25 MG tablet Take 1 tablet by mouth as needed.     medroxyPROGESTERone (PROVERA) 5 MG tablet Take one tablet a day for 5 days every 1-2 months if no spontaneous menses. (Patient not taking: Reported on 05/08/2022) 15 tablet 3   metoprolol succinate (TOPROL-XL) 25 MG 24 hr tablet Take 1 tablet (25 mg total) by mouth daily. 15 tablet 0   No current facility-administered medications for this visit.    Allergies:   Patient has no known allergies.    Social History:  The patient  reports that she has never smoked. She has never used smokeless tobacco. She reports current alcohol use. She reports that she does not use drugs.   Family History:  The patient's family history includes Alcohol abuse in her mother and paternal grandfather; Arrhythmia in her father; Breast cancer (age of onset: 45) in her mother; Breast cancer (age of onset: 73) in her maternal grandmother; Cancer (age of onset: 58) in her paternal grandfather; Colon cancer in her paternal grandfather; Dementia (age of onset: 72) in her paternal grandmother; Early death (age of onset: 92) in her maternal grandfather; Heart attack in her father; Heart disease in her father and paternal grandfather; Hyperlipidemia in her father; Hypertension in her father and mother; Kidney disease in her maternal grandfather; Lung cancer in her father; Lung cancer (age of onset: 41) in her mother; Skin cancer in her paternal grandfather; Stroke in her father; Varicose Veins in her father and paternal grandmother.    ROS:  Please see the history of present illness.   Otherwise, review of systems are  positive for none.   All other systems are reviewed and negative.    PHYSICAL EXAM: VS:  LMP 04/12/2022 (Exact Date)  , BMI There is no height or weight on file to calculate BMI. GENERAL:  Well appearing HEENT:  Pupils equal round and reactive, fundi not visualized, oral mucosa unremarkable NECK:  No jugular venous distention, waveform within normal limits, carotid upstroke brisk and symmetric, no bruits, no thyromegaly LYMPHATICS:  No cervical adenopathy LUNGS:  Clear to auscultation bilaterally HEART:  RRR.  PMI not displaced or sustained,S1 and S2 within normal limits, no S3, no S4, no clicks, no rubs, III/VI early to late systolic murmur loudest across the anterior precordium and radiating to the carotids bilaterally. ABD:  Flat, positive bowel sounds normal in frequency in pitch, no bruits, no rebound, no guarding, no midline pulsatile mass, no hepatomegaly, no splenomegaly EXT:  2 plus pulses throughout, no edema, no cyanosis no clubbing SKIN:  No rashes no nodules NEURO:  Cranial nerves II through XII grossly intact, motor grossly intact throughout PSYCH:  Cognitively intact, oriented to person place and time   EKG:  EKG is ordered today. 03/03/21: Sinus rhythm.  Rate 61 bpm.   Echo 2/30/17: Study Conclusions   - Left ventricle: The cavity size was normal. Systolic function was    normal. The estimated ejection fraction was in the range of 55%    to 60%. Wall motion was normal; there were no regional wall    motion abnormalities.  - Mitral valve: No significant prolapse noted.  - Atrial septum: A patent foramen ovale cannot be excluded.  - Impressions: Normal GLS -20.6.   Impressions:   - Normal GLS -20.6.   48 Hour Holter Monitor 05/04/15:   Quality: Fair.  Baseline artifact. Predominant rhythm: sinus rhythm and sinus arrhythmia Average heart rate: 67 bpm Max heart rate: 125 bpm Min heart rate: 46 bpm Pauses >2.5 seconds: 0 1 PVC 19 PACs were noted.   Patient did  not submit a symptom diary  Recent Labs: No results found for requested labs within last 365 days.   01/08/14:  Sodium 138, potassium 4.6, BUN 12, creatinine 0.81 AST 12, ALT 9 WBC 6.6, and hemoglobin 12.9, hematocrit 37.4, platelets 228 ESR dateTSH 2.1 Lipid Panel    Component Value Date/Time   CHOL 193 12/06/2016 0923   TRIG 88 12/06/2016 0923   HDL 49 12/06/2016 0923   CHOLHDL 3.9 12/06/2016 0923   CHOLHDL 4.4 12/05/2015 0849   VLDL 16 12/05/2015 0849   LDLCALC 126 (H) 12/06/2016 0923      Wt Readings from Last 3 Encounters:  05/08/22 180 lb (81.6 kg)  04/09/22 179 lb (81.2 kg)  04/07/21 182 lb 9.6 oz (82.8 kg)      ASSESSMENT AND PLAN:  No problem-specific Assessment & Plan notes found for this encounter.     Current medicines are reviewed at length with the patient today.  The patient does not have concerns regarding medicines.  The following changes have been made:  no change  Labs/ tests ordered today include:   No orders of the defined types were placed in this encounter.     Disposition:   FU with Jameila Keeny C. Oval Linsey, MD, [facility] in 1 year.    This note was written with the assistance of speech recognition software.  Please excuse any transcriptional errors.  Signed, Esbeydi Manago C. Oval Linsey, MD, [facility]  05/21/2022 8:49 AM    [facility]

## 2022-05-23 ENCOUNTER — Encounter (HOSPITAL_BASED_OUTPATIENT_CLINIC_OR_DEPARTMENT_OTHER): Payer: Self-pay | Admitting: Cardiovascular Disease

## 2022-05-23 ENCOUNTER — Ambulatory Visit (HOSPITAL_BASED_OUTPATIENT_CLINIC_OR_DEPARTMENT_OTHER): Payer: BC Managed Care – PPO | Admitting: Cardiovascular Disease

## 2022-05-23 VITALS — BP 158/64 | HR 53 | Ht 61.5 in | Wt 183.3 lb

## 2022-05-23 DIAGNOSIS — E782 Mixed hyperlipidemia: Secondary | ICD-10-CM | POA: Diagnosis not present

## 2022-05-23 DIAGNOSIS — I491 Atrial premature depolarization: Secondary | ICD-10-CM

## 2022-05-23 DIAGNOSIS — I10 Essential (primary) hypertension: Secondary | ICD-10-CM | POA: Diagnosis not present

## 2022-05-23 HISTORY — DX: Atrial premature depolarization: I49.1

## 2022-05-23 MED ORDER — ATORVASTATIN CALCIUM 20 MG PO TABS: 20.0000 mg | ORAL_TABLET | Freq: Every day | ORAL | 3 refills | Status: DC

## 2022-05-23 MED ORDER — METOPROLOL SUCCINATE ER 25 MG PO TB24: 25.0000 mg | ORAL_TABLET | Freq: Every day | ORAL | 1 refills | Status: DC

## 2022-05-23 NOTE — Assessment & Plan Note (Signed)
Lipids are well-controlled on atorvastatin.  Continue exercise and working on diet as above.

## 2022-05-23 NOTE — Patient Instructions (Signed)
Medication Instructions:  Your physician recommends that you continue on your current medications as directed. Please refer to the Current Medication list given to you today.  *If you need a refill on your cardiac medications before your next appointment, please call your pharmacy*  Lab Work: NONE  Testing/Procedures: NONE  Follow-Up: At [facility], you and your health needs are our priority.  As part of our continuing mission to provide you with exceptional heart care, we have created designated Provider Care Teams.  These Care Teams include your primary Cardiologist (physician) and Advanced Practice Providers (APPs -  Physician Assistants and Nurse Practitioners) who all work together to provide you with the care you need, when you need it.  We recommend signing up for the patient portal called "MyChart".  Sign up information is provided on this After Visit Summary.  MyChart is used to connect with patients for Virtual Visits (Telemedicine).  Patients are able to view lab/test results, encounter notes, upcoming appointments, etc.  Non-urgent messages can be sent to your provider as well.   To learn more about what you can do with MyChart, go to [email or web address].    Your next appointment:   12 month(s)  Provider:   Skeet Latch, MD    PHARM D 1 MONTH MONITOR YOUR BLOOD PRESSURE TWICE A DAY, LOG. BRING YOUR READINGS AND MACHINE TO YOUR FOLLOW UP APPOINTMENT

## 2022-05-23 NOTE — Assessment & Plan Note (Signed)
Blood pressure is elevated today both initially and on repeat.  She thinks that in general it has been well-controlled at home, though she has not been checking it as often recently.  She has been under some stress as her father recently passed and she is handling his estate.  She also notes that she has been eating more processed foods than usual.  She will work on this and track her blood pressures twice a day for a month.  She will bring both her log and her machine to follow-up.  Continue metoprolol for now.  Would not titrate further due to bradycardia.

## 2022-05-23 NOTE — Assessment & Plan Note (Signed)
Prior ambulatory monitor has revealed PACs and PVCs.  Symptoms have been stable on metoprolol.  She is little bradycardic today but asymptomatic.  Continue current dose.

## 2022-05-25 DIAGNOSIS — F411 Generalized anxiety disorder: Secondary | ICD-10-CM | POA: Diagnosis not present

## 2022-05-30 DIAGNOSIS — F411 Generalized anxiety disorder: Secondary | ICD-10-CM | POA: Diagnosis not present

## 2022-06-19 DIAGNOSIS — F411 Generalized anxiety disorder: Secondary | ICD-10-CM | POA: Diagnosis not present

## 2022-06-22 ENCOUNTER — Encounter (HOSPITAL_BASED_OUTPATIENT_CLINIC_OR_DEPARTMENT_OTHER): Payer: Self-pay | Admitting: Family

## 2022-06-22 ENCOUNTER — Ambulatory Visit (HOSPITAL_BASED_OUTPATIENT_CLINIC_OR_DEPARTMENT_OTHER): Payer: BC Managed Care – PPO | Admitting: Family

## 2022-06-22 VITALS — BP 128/64 | HR 54 | Ht 61.5 in | Wt 184.0 lb

## 2022-06-22 DIAGNOSIS — I10 Essential (primary) hypertension: Secondary | ICD-10-CM | POA: Diagnosis not present

## 2022-06-22 DIAGNOSIS — E782 Mixed hyperlipidemia: Secondary | ICD-10-CM

## 2022-06-22 DIAGNOSIS — R002 Palpitations: Secondary | ICD-10-CM

## 2022-06-22 NOTE — Progress Notes (Unsigned)
Office Visit    Patient Name: Susan Cordova Date of Encounter: 06/23/2022  PCP:  Inda Coke, [place]  Cardiologist:  Skeet Latch, MD  Advanced Practice Provider:  No care team member to display Electrophysiologist:  None      Chief Complaint    Susan Cordova is a 52 y.o. female presents today for hypertension.  Past Medical History    Past Medical History:  Diagnosis Date   Abnormal menstrual cycle 07/27/2010   Anemia    Anxiety    ASCUS with positive high risk HPV 07/14/2009   Cancer ([place]) 2008   Bladder   Depression    Dysrhythmia    Essential hypertension 03/03/2021   Exertional dyspnea 03/03/2021   H/O rubella    H/O varicella    Heart murmur    History of varicose veins    Hyperlipidemia    Low iron    Mitral valve prolapse 05/03/2015   PAC (premature atrial contraction) 05/23/2022   Palpitations 05/03/2015   Sleep apnea    Past Surgical History:  Procedure Laterality Date   BLADDER SURGERY  2008   cancer - no chemo, no urostomy   CERVICAL BIOPSY  W/ LOOP ELECTRODE EXCISION     COLPOSCOPY     CYSTOSCOPY     LEEP     LEEP  11/24/2010   Procedure: LOOP ELECTROSURGICAL EXCISION PROCEDURE (LEEP);  Surgeon: Betsy Coder, MD;  Location: [facility];  Service: Gynecology;  Laterality: N/A;   tubes in ear   [place]    Allergies  No Known Allergies  History of Present Illness    Susan Cordova is a 52 y.o. female with a hx of dyspnea, palpitations, hypertension last seen 05/23/22  Initially evaluated in 2017 for palpitations.  Ambulatory monitoring revealed some PACs and PVCs.  Echocardiogram with LVEF 55 to 60%, no mitral valve prolapse, PFO not able to be excluded.  She was started on metoprolol for palpitations.  Echocardiogram 04/2020 at [place] [facility] with LVEF 60% trace MR.  She was seen 03/03/2021 noting increasing palpitations, elevated blood pressure at home, increased dyspnea on  exertion.  Coronary CTA showed a coronary calcium score of 0.    Last saw Dr. Oval Linsey 05/23/22 with BP noted to be mildly elevated. She attributed this to stress as her father had passed away a couple months ago and was handling his estate.   She presents today for follow-up. 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 Monitoring BP at home, often better in morning than evening though does not always rest 5-10 minutes prior to checking in the evening. Over the last 2 weeks her average systolic blood pressure is 128. Home cuff found to be accurate today.   Reports no shortness of breath nor dyspnea on exertion. Reports no chest pain, pressure, or tightness. No edema, orthopnea, PND. Reports no palpitations.    EKGs/Labs/Other Studies Reviewed:   The following studies were reviewed today:  Cardiac CT 03/21/21 Aorta: Normal size.  No calcifications.  No dissection.   Aortic Valve:  Trileaflet.  No calcifications.   Coronary Arteries:  Normal coronary origin.  Right dominance.   RCA is a large dominant artery that gives rise to PDA and PLA. There is no plaque.   Left main is a large artery that gives rise to LAD and  LCX arteries.   LAD is a large vessel that has no plaque.   LCX is a non-dominant artery that gives rise to one large OM1 branch. There is no plaque.   Coronary Calcium Score:   Left main: 0   Left anterior descending artery: 0   Left circumflex artery: 0   Right coronary artery: 0   Total: 0   Percentile: 0   Other findings:   Normal pulmonary vein drainage into the left atrium.   Normal left atrial appendage without a thrombus.   Normal size of the pulmonary artery.   IMPRESSION: 1. Coronary calcium score of 0. This was 0 percentile for age and sex matched control.   2. Normal coronary origin with right dominance.   3. No evidence of CAD. CAD-RADS 0. No evidence  of CAD (0%). Consider non-atherosclerotic causes of chest pain.   Echo 2/30/17: Study Conclusions   - Left ventricle: The cavity size was normal. Systolic function was    normal. The estimated ejection fraction was in the range of 55%    to 60%. Wall motion was normal; there were no regional wall    motion abnormalities.  - Mitral valve: No significant prolapse noted.  - Atrial septum: A patent foramen ovale cannot be excluded.  - Impressions: Normal GLS -20.6.   Impressions:   - Normal GLS -20.6.    48 Hour Holter Monitor 05/04/15:   Quality: Fair.  Baseline artifact. Predominant rhythm: sinus rhythm and sinus arrhythmia Average heart rate: 67 bpm Max heart rate: 125 bpm Min heart rate: 46 bpm Pauses >2.5 seconds: 0 1 PVC 19 PACs were noted.   Patient did not submit a symptom diary  EKG:  No EKG today.   Recent Labs: No results found for requested labs within last 365 days.  Recent Lipid Panel    Component Value Date/Time   CHOL 193 12/06/2016 0923   TRIG 88 12/06/2016 0923   HDL 49 12/06/2016 0923   CHOLHDL 3.9 12/06/2016 0923   CHOLHDL 4.4 12/05/2015 0849   VLDL 16 12/05/2015 0849   LDLCALC 126 (H) 12/06/2016 0923   Home Medications   Current Meds  Medication Sig   atorvastatin (LIPITOR) 20 MG tablet Take 1 tablet (20 mg total) by mouth daily.   Cholecalciferol (VITAMIN D) 50 MCG (2000 UT) tablet Take 1 tablet by mouth daily.   Ferrous Fumarate (IRON) 18 MG TBCR Take by mouth. 4-5 x's per month with menstrual cycle   FLUoxetine (PROZAC) 10 MG capsule    hydrOXYzine (VISTARIL) 25 MG capsule 25 mg as needed.   meclizine (ANTIVERT) 25 MG tablet Take 1 tablet by mouth as needed.   medroxyPROGESTERone (PROVERA) 5 MG tablet Take one tablet a day for 5 days every 1-2 months if no spontaneous menses.   metoprolol succinate (TOPROL-XL) 25 MG 24 hr tablet Take 1 tablet (25 mg total) by mouth daily.     Review of Systems      All other systems reviewed and are  otherwise negative except as noted above.  Physical Exam    VS:  BP (!) 142/96   Pulse (!) 54   Ht 5' 1.5" (1.562 m)   Wt 184 lb (83.5 kg)   BMI 34.20 kg/m  , BMI Body mass index is 34.2 kg/m.  Wt Readings from Last 3 Encounters:  06/22/22 184 lb (83.5 kg)  05/23/22 183 lb 4.8 oz (83.1 kg)  05/08/22 180 lb (81.6 kg)     GEN:  Well nourished, well developed, in no acute distress. HEENT: normal. Neck: Supple, no JVD, carotid bruits, or masses. Cardiac: RRR, no murmurs, rubs, or gallops. No clubbing, cyanosis, edema.  Radials/PT 2+ and equal bilaterally.  Respiratory:  Respirations regular and unlabored, clear to auscultation bilaterally. GI: Soft, nontender, nondistended. MS: No deformity or atrophy. Skin: Warm and dry, no rash. Neuro:  Strength and sensation are intact. Psych: Normal affect.  Assessment & Plan    Hypertension - BP well controlled. Continue current antihypertensive regimen.  She will continue to monitor 2-3 times per week and contact our office if BP consistently >130/80.   Exertional dyspnea - Echo 04/2020 normal LVEF. 02/2021 coronary calcium score of 0. No recurrent exertional dyspnea. Continues to walk regularly for exercise. No indication for further cardiac workup.  Palpitations - well controlled on current dose Metoprolol.   HLD - Continue Atorvastatin '20mg'$  QD. No myalgias.  Disposition: Follow up in 6 month(s) with Skeet Latch, MD or APP.  Signed, Loel Dubonnet, NP 06/23/2022, 3:49 PM [place]

## 2022-06-22 NOTE — Patient Instructions (Addendum)
Medication Instructions:  Your physician recommends that you continue on your current medications as directed. Please refer to the Current Medication list given to you today.  *If you need a refill on your cardiac medications before your next appointment, please call your pharmacy*  Follow-Up: At [facility], you and your health needs are our priority.  As part of our continuing mission to provide you with exceptional heart care, we have created designated Provider Care Teams.  These Care Teams include your primary Cardiologist (physician) and Advanced Practice Providers (APPs -  Physician Assistants and Nurse Practitioners) who all work together to provide you with the care you need, when you need it.  We recommend signing up for the patient portal called "MyChart".  Sign up information is provided on this After Visit Summary.  MyChart is used to connect with patients for Virtual Visits (Telemedicine).  Patients are able to view lab/test results, encounter notes, upcoming appointments, etc.  Non-urgent messages can be sent to your provider as well.   To learn more about what you can do with MyChart, go to [email or web address].    Your next appointment:   6 month(s)  Provider:   Skeet Latch, MD or Laurann Montana, NP

## 2022-06-23 ENCOUNTER — Encounter (HOSPITAL_BASED_OUTPATIENT_CLINIC_OR_DEPARTMENT_OTHER): Payer: Self-pay | Admitting: Family

## 2022-06-26 DIAGNOSIS — F411 Generalized anxiety disorder: Secondary | ICD-10-CM | POA: Diagnosis not present

## 2022-06-27 DIAGNOSIS — F411 Generalized anxiety disorder: Secondary | ICD-10-CM | POA: Diagnosis not present

## 2022-06-28 DIAGNOSIS — F411 Generalized anxiety disorder: Secondary | ICD-10-CM | POA: Diagnosis not present

## 2022-07-09 ENCOUNTER — Encounter: Payer: Self-pay | Admitting: Obstetrics and Gynecology

## 2022-07-16 DIAGNOSIS — F411 Generalized anxiety disorder: Secondary | ICD-10-CM | POA: Diagnosis not present

## 2022-07-26 ENCOUNTER — Encounter: Payer: Self-pay | Admitting: Physician Assistant

## 2022-07-26 DIAGNOSIS — Z809 Family history of malignant neoplasm, unspecified: Secondary | ICD-10-CM

## 2022-07-27 ENCOUNTER — Telehealth: Payer: Self-pay | Admitting: Genetic Counselor

## 2022-07-27 NOTE — Telephone Encounter (Signed)
Reached out to patient to reschedule; patient decided to leave appointment as is for now.

## 2022-07-27 NOTE — Telephone Encounter (Signed)
Reached out to patient to schedule per IB, no answer.

## 2022-08-03 DIAGNOSIS — F411 Generalized anxiety disorder: Secondary | ICD-10-CM | POA: Diagnosis not present

## 2022-08-13 DIAGNOSIS — F411 Generalized anxiety disorder: Secondary | ICD-10-CM | POA: Diagnosis not present

## 2022-08-31 DIAGNOSIS — F411 Generalized anxiety disorder: Secondary | ICD-10-CM | POA: Diagnosis not present

## 2022-09-10 DIAGNOSIS — F411 Generalized anxiety disorder: Secondary | ICD-10-CM | POA: Diagnosis not present

## 2022-09-12 NOTE — Progress Notes (Unsigned)
REFERRING PROVIDER: [name], PA [address] [place],  [place] [address]  PRIMARY PROVIDER:  [name], [place]  PRIMARY REASON FOR VISIT:  Encounter Diagnoses  Name Primary?   History of bladder cancer Yes   Family history of breast cancer    Family history of colon cancer     HISTORY OF PRESENT ILLNESS:   Susan Cordova, a 52 y.o. female, was seen for a  cancer genetics consultation at the request of [name], PA-C due to a personal and family history of cancer.  Ms. Chadwick presents to clinic today to discuss the possibility of a hereditary predisposition to cancer, to discuss genetic testing, and to further clarify her future cancer risks, as well as potential cancer risks for family members.   Ms. Fawcett reported that she was diagnosed with bladder cancer at the age of 76 s/p resection.    CANCER HISTORY:  Oncology History   No history exists.     RISK FACTORS:  Mammogram within the last year: yes; category b density  Number of breast biopsies: 0. Colonoscopy: yes in 2023; f/u 10 years per pt Hysterectomy: no.  Ovaries intact: yes.  Menarche was at age 89.  Nulliparous. HRT use: 0 years. Dermatology screening annually.    Past Medical History:  Diagnosis Date   Abnormal menstrual cycle 07/27/2010   Anemia    Anxiety    ASCUS with positive high risk HPV 07/14/2009   Cancer (HCC) 2008   Bladder   Depression    Dysrhythmia    Essential hypertension 03/03/2021   Exertional dyspnea 03/03/2021   H/O rubella    H/O varicella    Heart murmur    History of varicose veins    Hyperlipidemia    Low iron    Mitral valve prolapse 05/03/2015   PAC (premature atrial contraction) 05/23/2022   Palpitations 05/03/2015   Sleep apnea     Past Surgical History:  Procedure Laterality Date   BLADDER SURGERY  2008   cancer - no chemo, no urostomy   CERVICAL BIOPSY  W/ LOOP ELECTRODE EXCISION     COLPOSCOPY     CYSTOSCOPY     LEEP     LEEP   11/24/2010   Procedure: LOOP ELECTROSURGICAL EXCISION PROCEDURE (LEEP);  Surgeon: Michael Litter, MD;  Location: [facility];  Service: Gynecology;  Laterality: N/A;   tubes in ear   1977   WISDOM TOOTH EXTRACTION  1991    FAMILY HISTORY:  We obtained a detailed, 4-generation family history.  Significant diagnoses are listed below: Family History  Problem Relation Age of Onset   Breast cancer Mother 57   Lung cancer Mother 60       died at 35; smoking hx   Breast cancer Maternal Grandmother        dx 36s   Colon cancer Paternal Grandfather 100   Skin cancer Paternal Grandfather 53      Ms. Aguino stated that her mother had negative BRCA testing when she was diagnosed with breast cancer (approximately 15 years ago--likely Ashkenazi Jewish founder mutations only).  She remembers hearing that her maternal aunt had negative genetic testing previously.  No reports were available for review today.   Patient's maternal ancestors are of [place] and [place] descent, and paternal ancestors are of [place], and [place] descent. She reports Ashkenazi Jewish ancestry in her both her maternal and paternal family. There is no known consanguinity.  GENETIC COUNSELING ASSESSMENT: Ms. Abundiz is a  52 y.o. female with a personal and family history of cancer which is somewhat suggestive of a hereditary cancer syndrome given her age of diagnosis of bladder cancer, the family history of breast cancer, and her Ashkenazi Jewish ancestry. We, therefore, discussed and recommended the following at today's visit.   DISCUSSION: We discussed that 5 - 10% of cancer is hereditary.  Most cases of hereditary bladder cancer are associated with mutations in the Lynch syndrome genes.  Most cases of hereditary breast cancer are associated with mutations in BRCA1/2.  There are other genes that can be associated with hereditary breast, colon, and other cancer syndromes.  We discussed that testing is beneficial for several  reasons including knowing how to follow individuals for their cancer risks and understanding if other family members could be at risk for cancer and allowing them to undergo genetic testing.   We reviewed the characteristics, features and inheritance patterns of hereditary cancer syndromes. We also discussed genetic testing, including the appropriate family members to test, the process of testing, insurance coverage and turn-around-time for results. We discussed the implications of a negative, positive, carrier and/or variant of uncertain significant result. We recommended Ms. Farrel pursue genetic testing for a panel that includes genes associated with bladder, breast, and bladder cancer.  She is most interested in well-studies and medically actionable genes with clear guidelines.   The CustomNext-Cancer +RNAinsight Panel offered by [facility] includes sequencing, rearrangement analysis, and RNA analysis for the following 29 genes:   APC, ATM, AXIN2, BARD1, BMPR1A, BRCA1, BRCA2, BRIP1, CDH1, CDKN2A, CHEK2, EPCAM, GREM1, MLH1, MSH2, MSH3, MSH6, MUTYH, NTHL1, PALB2, PMS2, POLD1, POLE, PTEN, RAD51C, RAD51D, SMAD4, STK11, and TP53.   Based on Ms. Nez's personal history of bladder cancer before age 73, she meets medical criteria for genetic testing. Despite that she meets criteria, she may still have an out of pocket cost. We discussed that if her out of pocket cost for testing is over $100, the laboratory should contact her and discuss the self-pay prices and/or patient pay assistance programs.    PLAN: After considering the risks, benefits, and limitations, Ms. Sharer provided informed consent to pursue genetic testing and the blood sample was sent to [facility] for analysis of the CustomNext-Cancer +RNAinsight Panel (29 genes). Results should be available within approximately 3 weeks' time, at which point they will be disclosed by telephone to Ms. Mcalpine, as will any additional recommendations  warranted by these results. Ms. Lanteigne will receive a summary of her genetic counseling visit and a copy of her results once available. This information will also be available in Epic.   Ms. Galvao questions were answered to her satisfaction today. Our contact information was provided should additional questions or concerns arise. Thank you for the referral and allowing [place] to share in the care of your patient.   Beatrice Sehgal M. Rennie Plowman, MS, [facility] Genetic Counselor [email or web address] (P) [phone]  The patient was seen for a total of 30 minutes in face-to-face genetic counseling.  The patient was seen alone.  Drs. Pamelia Hoit and/or Mosetta Putt were available to discuss this case as needed.    _______________________________________________________________________ For Office Staff:  Number of people involved in session: 1 Was an Intern/ student involved with case: no

## 2022-09-13 ENCOUNTER — Inpatient Hospital Stay: Payer: BC Managed Care – PPO | Admitting: Genetic Counselor

## 2022-09-13 ENCOUNTER — Encounter: Payer: Self-pay | Admitting: Genetic Counselor

## 2022-09-13 ENCOUNTER — Inpatient Hospital Stay: Payer: BC Managed Care – PPO

## 2022-09-13 ENCOUNTER — Other Ambulatory Visit: Payer: Self-pay | Admitting: Genetic Counselor

## 2022-09-13 DIAGNOSIS — Z8551 Personal history of malignant neoplasm of bladder: Secondary | ICD-10-CM

## 2022-09-13 DIAGNOSIS — Z803 Family history of malignant neoplasm of breast: Secondary | ICD-10-CM

## 2022-09-13 DIAGNOSIS — Z800 Family history of malignant neoplasm of digestive organs: Secondary | ICD-10-CM

## 2022-09-13 LAB — GENETIC SCREENING ORDER

## 2022-09-26 DIAGNOSIS — F411 Generalized anxiety disorder: Secondary | ICD-10-CM | POA: Diagnosis not present

## 2022-09-27 ENCOUNTER — Encounter: Payer: Self-pay | Admitting: Genetic Counselor

## 2022-09-27 ENCOUNTER — Ambulatory Visit: Payer: Self-pay | Admitting: Genetic Counselor

## 2022-09-27 ENCOUNTER — Telehealth: Payer: Self-pay | Admitting: Genetic Counselor

## 2022-09-27 DIAGNOSIS — Z1379 Encounter for other screening for genetic and chromosomal anomalies: Secondary | ICD-10-CM

## 2022-09-27 DIAGNOSIS — Z8551 Personal history of malignant neoplasm of bladder: Secondary | ICD-10-CM

## 2022-09-27 DIAGNOSIS — Z803 Family history of malignant neoplasm of breast: Secondary | ICD-10-CM

## 2022-09-27 DIAGNOSIS — Z800 Family history of malignant neoplasm of digestive organs: Secondary | ICD-10-CM

## 2022-09-27 HISTORY — DX: Encounter for other screening for genetic and chromosomal anomalies: Z13.79

## 2022-09-27 NOTE — Telephone Encounter (Signed)
Contacted patient in attempt to disclose results of genetic testing.  LVM with contact information requesting a call back.  

## 2022-09-27 NOTE — Telephone Encounter (Signed)
Disclosed negative genetics.  Discussed elevated TC score (between 18-21 percent depending on menopausal status).  Patient said she has periods but are irregular.  Recommended she speak with GYN about appropriate breast cancer screening.

## 2022-10-09 DIAGNOSIS — F411 Generalized anxiety disorder: Secondary | ICD-10-CM | POA: Diagnosis not present

## 2022-11-04 NOTE — Progress Notes (Signed)
HPI:   Ms. Patalano was previously seen in the [facility] due to a personal history of bladder cancer, family history of breast and other cancers, and concerns regarding a hereditary predisposition to cancer.    Ms. Gollihar recent genetic test results were disclosed to her by telephone. These results and recommendations are discussed in more detail below.  CANCER HISTORY:  Ms. Bard reported that she was diagnosed with bladder cancer at the age of 52 s/p resection.      FAMILY HISTORY:  We obtained a detailed, 4-generation family history.  Significant diagnoses are listed below:      Family History  Problem Relation Age of Onset   Breast cancer Mother 36   Lung cancer Mother 26        died at 26; smoking hx   Breast cancer Maternal Grandmother          dx 50s   Colon cancer Paternal Grandfather 25   Skin cancer Paternal Grandfather 55         Ms. Kaine stated that her mother had negative BRCA testing when she was diagnosed with breast cancer (approximately 15 years ago--likely Ashkenazi Jewish founder mutations only).  She remembers hearing that her maternal aunt had negative genetic testing previously.  No reports were available for review today.    Patient's maternal ancestors are of [place] and [place] descent, and paternal ancestors are of [place], and [place] descent. She reports Ashkenazi Jewish ancestry in her both her maternal and paternal family. There is no known consanguinity.    GENETIC TEST RESULTS:  The [facility] CustomNext-Cancer +RNAinsight Panel found no pathogenic mutations. The CustomNext-Cancer+RNAinsight panel offered by [facility] includes sequencing, rearrangement, and RNA analysis for the following 29 genes:   APC, ATM, BARD1, BMPR1A, BRCA1, BRCA2, BRIP1, CDH1, CDKN2A, CHEK2, MLH1, MSH2, MSH6, MUTYH, NTHL1, PALB2, PMS2, PTEN, RAD51C, RAD51D, SMAD4, STK11 and TP53 (sequencing and deletion/duplication); AXIN2, MSH3, POLD1 and POLE  (sequencing only); EPCAM and GREM1 (deletion/duplication only).   The test report has been scanned into EPIC and is located under the Molecular Pathology section of the Results Review tab.  A portion of the result report is included below for reference. Genetic testing reported out on September 26, 2022.      Even though a pathogenic variant was not identified, possible explanations for the cancer in the family may include: There may be no hereditary risk for cancer in the family. The cancers in Ms. Cassara and/or her family may be sporadic/familial or due to other genetic and environmental factors. There may be a gene mutation in one of these genes that current testing methods cannot detect but that chance is small. There could be another gene that has not yet been discovered, or that we have not yet tested, that is responsible for the cancer diagnoses in the family.  It is also possible there is a hereditary cause for the cancer in the family that Ms. Capozzi did not inherit.   Therefore, it is important to remain in touch with cancer genetics in the future so that we can continue to offer Ms. Fritts the most up to date genetic testing.     ADDITIONAL GENETIC TESTING:   Ms. Skoczylas genetic testing was fairly extensive.  If there are additional relevant genes identified to increase cancer risk that can be analyzed in the future, we would be happy to discuss and coordinate this testing at that time.      CANCER SCREENING RECOMMENDATIONS:  Ms. Mowery test result is considered negative (normal).  This means that we have not identified a hereditary cause for her personal history of bladder cancer at this time.   An individual's cancer risk and medical management are not determined by genetic test results alone. Overall cancer risk assessment incorporates additional factors, including personal medical history, family history, and any available genetic information that may result in a personalized plan  for cancer prevention and surveillance. Therefore, it is recommended she continue to follow the cancer management and screening guidelines provided by her urology and primary healthcare provider.  Her lifetime risk for breast cancer based on Tyrer-Cuzick risk model and reported personal/family history is 19%.  This is elevated compared to the general population but not in the 'high risk for breast cancer' category per NCCN guidelines (>20%).  Of note, this estimate marks her menopausal status as 'unknown.'  Ms. Mcclees says she has irregular periods.  Her risk estimate when considered premenopausal is 20.9%, and her risk estimate when considered perimenopausal is 19.9%.  This risk estimate can change over time and may be repeated to reflect new information in her personal or family history in the future.  She should speak with her referring provider about appropriate breast screening.     RECOMMENDATIONS FOR FAMILY MEMBERS:   Individuals in this family might be at some increased risk of developing cancer, over the general population risk, due to the family history of cancer.  Individuals in the family should notify their providers of the family history of cancer. We recommend women in this family have a yearly mammogram beginning at age 53, or 41 years younger than the earliest onset of cancer, an annual clinical breast exam, and perform monthly breast self-exams.  Risk models that take into account family history and hormonal history may be helpful in determining appropriate breast cancer screening options for family members.  Other members of the family may still carry a pathogenic variant in one of these genes that Ms. Yonts did not inherit. Based on the family history, we recommend her maternal aunt have updated genetic counseling testing.  FOLLOW-UP:  Cancer genetics is a rapidly advancing field and it is possible that new genetic tests will be appropriate for her and/or her family members in the  future. We encourage Ms. Folse to remain in contact with cancer genetics, so we can update her personal and family histories and let her know of advances in cancer genetics that may benefit this family.   Our contact number was provided.  She knows she is welcome to call us at anytime with additional questions or concerns.   Reiko Vinje M. Rennie Plowman, MS, [facility] Genetic Counselor [email or web address] (P) [phone]

## 2022-11-06 DIAGNOSIS — F411 Generalized anxiety disorder: Secondary | ICD-10-CM | POA: Diagnosis not present

## 2022-11-07 ENCOUNTER — Other Ambulatory Visit (HOSPITAL_BASED_OUTPATIENT_CLINIC_OR_DEPARTMENT_OTHER): Payer: Self-pay | Admitting: Cardiovascular Disease

## 2022-11-07 NOTE — Telephone Encounter (Signed)
Rx(s) sent to pharmacy electronically.  

## 2022-11-20 DIAGNOSIS — F411 Generalized anxiety disorder: Secondary | ICD-10-CM | POA: Diagnosis not present

## 2022-11-20 DIAGNOSIS — F4310 Post-traumatic stress disorder, unspecified: Secondary | ICD-10-CM | POA: Diagnosis not present

## 2022-11-20 DIAGNOSIS — F331 Major depressive disorder, recurrent, moderate: Secondary | ICD-10-CM | POA: Diagnosis not present

## 2022-12-03 ENCOUNTER — Encounter: Payer: BC Managed Care – PPO | Admitting: Physician Assistant

## 2022-12-04 DIAGNOSIS — F331 Major depressive disorder, recurrent, moderate: Secondary | ICD-10-CM | POA: Diagnosis not present

## 2022-12-04 DIAGNOSIS — F4310 Post-traumatic stress disorder, unspecified: Secondary | ICD-10-CM | POA: Diagnosis not present

## 2022-12-04 DIAGNOSIS — F411 Generalized anxiety disorder: Secondary | ICD-10-CM | POA: Diagnosis not present

## 2022-12-06 ENCOUNTER — Encounter (INDEPENDENT_AMBULATORY_CARE_PROVIDER_SITE_OTHER): Payer: Self-pay

## 2022-12-18 DIAGNOSIS — F331 Major depressive disorder, recurrent, moderate: Secondary | ICD-10-CM | POA: Diagnosis not present

## 2022-12-18 DIAGNOSIS — F4310 Post-traumatic stress disorder, unspecified: Secondary | ICD-10-CM | POA: Diagnosis not present

## 2022-12-18 DIAGNOSIS — F411 Generalized anxiety disorder: Secondary | ICD-10-CM | POA: Diagnosis not present

## 2022-12-28 ENCOUNTER — Ambulatory Visit (INDEPENDENT_AMBULATORY_CARE_PROVIDER_SITE_OTHER): Payer: BC Managed Care – PPO | Admitting: Physician Assistant

## 2022-12-28 ENCOUNTER — Encounter: Payer: Self-pay | Admitting: Physician Assistant

## 2022-12-28 VITALS — BP 130/86 | HR 54 | Temp 97.8°F | Ht 61.5 in | Wt 188.0 lb

## 2022-12-28 DIAGNOSIS — E669 Obesity, unspecified: Secondary | ICD-10-CM | POA: Insufficient documentation

## 2022-12-28 DIAGNOSIS — I10 Essential (primary) hypertension: Secondary | ICD-10-CM | POA: Diagnosis not present

## 2022-12-28 DIAGNOSIS — F4323 Adjustment disorder with mixed anxiety and depressed mood: Secondary | ICD-10-CM

## 2022-12-28 DIAGNOSIS — E782 Mixed hyperlipidemia: Secondary | ICD-10-CM

## 2022-12-28 DIAGNOSIS — Z1159 Encounter for screening for other viral diseases: Secondary | ICD-10-CM | POA: Diagnosis not present

## 2022-12-28 DIAGNOSIS — Z0000 Encounter for general adult medical examination without abnormal findings: Secondary | ICD-10-CM

## 2022-12-28 DIAGNOSIS — Z0001 Encounter for general adult medical examination with abnormal findings: Secondary | ICD-10-CM

## 2022-12-28 DIAGNOSIS — Z8551 Personal history of malignant neoplasm of bladder: Secondary | ICD-10-CM | POA: Diagnosis not present

## 2022-12-28 DIAGNOSIS — Z23 Encounter for immunization: Secondary | ICD-10-CM

## 2022-12-28 LAB — COMPREHENSIVE METABOLIC PANEL
ALT: 15 U/L (ref 0–35)
AST: 16 U/L (ref 0–37)
Albumin: 4.2 g/dL (ref 3.5–5.2)
Alkaline Phosphatase: 58 U/L (ref 39–117)
BUN: 11 mg/dL (ref 6–23)
CO2: 29 meq/L (ref 19–32)
Calcium: 9.4 mg/dL (ref 8.4–10.5)
Chloride: 104 meq/L (ref 96–112)
Creatinine, Ser: 0.79 mg/dL (ref 0.40–1.20)
GFR: 85.73 mL/min (ref >60.00)
Glucose, Bld: 91 mg/dL (ref 70–99)
Potassium: 4.3 meq/L (ref 3.5–5.1)
Sodium: 138 meq/L (ref 135–145)
Total Bilirubin: 0.7 mg/dL (ref 0.2–1.2)
Total Protein: 7.3 g/dL (ref 6.0–8.3)

## 2022-12-28 LAB — LIPID PANEL
Cholesterol: 140 mg/dL (ref 0–200)
HDL: 48.8 mg/dL (ref >39.00)
LDL Cholesterol: 72 mg/dL (ref 0–99)
NonHDL: 90.91
Total CHOL/HDL Ratio: 3
Triglycerides: 97 mg/dL (ref 0.0–149.0)
VLDL: 19.4 mg/dL (ref 0.0–40.0)

## 2022-12-28 LAB — URINALYSIS, ROUTINE W REFLEX MICROSCOPIC
Bilirubin Urine: NEGATIVE
Ketones, ur: NEGATIVE
Leukocytes,Ua: NEGATIVE
Nitrite: NEGATIVE
Specific Gravity, Urine: <=1.005 — AB (ref 1.000–1.030)
Total Protein, Urine: NEGATIVE
Urine Glucose: NEGATIVE
Urobilinogen, UA: 0.2 (ref 0.0–1.0)
pH: 6 (ref 5.0–8.0)

## 2022-12-28 LAB — CBC WITH DIFFERENTIAL/PLATELET
Basophils Absolute: 0 10*3/uL (ref 0.0–0.1)
Basophils Relative: 0.4 % (ref 0.0–3.0)
Eosinophils Absolute: 0.1 10*3/uL (ref 0.0–0.7)
Eosinophils Relative: 0.9 % (ref 0.0–5.0)
HCT: 36.9 % (ref 36.0–46.0)
Hemoglobin: 11.6 g/dL — ABNORMAL LOW (ref 12.0–15.0)
Lymphocytes Relative: 30 % (ref 12.0–46.0)
Lymphs Abs: 2 10*3/uL (ref 0.7–4.0)
MCHC: 31.4 g/dL (ref 30.0–36.0)
MCV: 83 fl (ref 78.0–100.0)
Monocytes Absolute: 0.5 10*3/uL (ref 0.1–1.0)
Monocytes Relative: 7.2 % (ref 3.0–12.0)
Neutro Abs: 4.2 10*3/uL (ref 1.4–7.7)
Neutrophils Relative %: 61.5 % (ref 43.0–77.0)
Platelets: 238 10*3/uL (ref 150.0–400.0)
RBC: 4.45 Mil/uL (ref 3.87–5.11)
RDW: 14.7 % (ref 11.5–15.5)
WBC: 6.8 10*3/uL (ref 4.0–10.5)

## 2022-12-28 NOTE — Patient Instructions (Signed)
It was great to see you! ? ?Please go to the lab for blood work.  ? ?Our office will call you with your results unless you have chosen to receive results via MyChart. ? ?If your blood work is normal we will follow-up each year for physicals and as scheduled for chronic medical problems. ? ?If anything is abnormal we will treat accordingly and get you in for a follow-up. ? ?Take care, ? ?Samantha ?  ? ? ?

## 2022-12-28 NOTE — Progress Notes (Signed)
Subjective:    Susan Cordova is a 52 y.o. female and is here for a comprehensive physical exam.  HPI  Health Maintenance Due  Topic Date Due   Hepatitis C Screening  Never done   Colonoscopy  Never done    Acute Concerns: There are no acute issues discussed today.  Chronic Issues: Genetic Testing; history of bladder cancer Patient states that she had genetic testing done a few months ago which revealed an elevated TC score: Per genetics "Her lifetime risk for breast cancer based on Tyrer-Cuzick risk model and reported personal/family history is 19%.  This is elevated compared to the general population but not in the 'high risk for breast cancer' category per NCCN guidelines (>20%).  Of note, this estimate marks her menopausal status as 'unknown.'  Susan Cordova says she has irregular periods.  Her risk estimate when considered premenopausal is 20.9%, and her risk estimate when considered perimenopausal is 19.9%.  This risk estimate can change over time and may be repeated to reflect new information in her personal or family history in the future.  She should speak with her referring provider about appropriate breast screening." She gets annual urinalysis  Adjustment Reaction with Anxiety and Depression Patient is requesting provider to take over her 10 mg prozac prescription Doing well on this dosage Denies any new/worsening symptom(s) Denies Cordova/HI  Continues with talk therapy  Iron Patient is compliant with 18 mg iron supplements with menstrual cycle.   HTN Currently taking metoprolol succinate 25 mg daily. At home blood pressure readings are: not checked. Patient denies chest pain, SOB, blurred vision, dizziness, unusual headaches, lower leg swelling. Patient is compliant with medication. Denies excessive caffeine intake, stimulant usage, excessive alcohol intake, or increase in salt consumption.  BP Readings from Last 3 Encounters:  12/28/22 130/86  06/22/22 128/64  05/23/22 (!)  158/64     Health Maintenance: Immunizations -- Patient does not want to receive flu vaccine this visit.  Mammogram -- last completed on 05/15/2022. PAP -- last completed on 04/09/2022. Diet -- Patient is working on eating healthier food options. Exercise -- Patient exercises regularly. She is walking multiple times a day  Sleep habits -- She compliant with CPAP and has trouble falling asleep, but doe snot need medical intervention. Mood -- Patient is in a stable mood this visit.  UTD with dentist? - She is UTD on dental care. UTD with eye doctor? - She is UTD on vision  care.  Weight history: Wt Readings from Last 10 Encounters:  12/28/22 188 lb (85.3 kg)  06/22/22 184 lb (83.5 kg)  05/23/22 183 lb 4.8 oz (83.1 kg)  05/08/22 180 lb (81.6 kg)  04/09/22 179 lb (81.2 kg)  04/07/21 182 lb 9.6 oz (82.8 kg)  04/04/21 180 lb (81.6 kg)  03/03/21 176 lb 3.2 oz (79.9 kg)  09/03/19 162 lb (73.5 kg)  07/23/19 167 lb (75.8 kg)   Body mass index is 34.95 kg/m. Patient's last menstrual period was 12/18/2022 (exact date).  Alcohol use:  reports current alcohol use.  Tobacco use:  Tobacco Use: Low Risk  (12/28/2022)   Patient History    Smoking Tobacco Use: Never    Smokeless Tobacco Use: Never    Passive Exposure: Not on file   Eligible for lung cancer screening? no     12/28/2022   10:24 AM  Depression screen PHQ 2/9  Decreased Interest 1  Down, Depressed, Hopeless 1  PHQ - 2 Score 2  Altered sleeping 3  Tired, decreased energy 2  Change in appetite 2  Feeling bad or failure about yourself  1  Trouble concentrating 1  Moving slowly or fidgety/restless 0  Suicidal thoughts 0  PHQ-9 Score 11  Difficult doing work/chores Somewhat difficult     Other providers/specialists: Patient Care Team: Susan Cordova, [place] as PCP - General (Physician Assistant) Susan Si, MD as PCP - Cardiology (Cardiology) Susan Mylar, MD as Referring Physician (Urology) Susan Baas, MD as Consulting Physician (Sports Medicine) Susan, Amy, MD as Consulting Physician (Dermatology) Susan Bolk, MD (Inactive) as Consulting Physician (Obstetrics and Gynecology) Susan Buttner, MD as Referring Physician (Dermatology)    PMHx, SurgHx, SocialHx, Medications, and Allergies were reviewed in the Visit Navigator and updated as appropriate.   Past Medical History:  Diagnosis Date   Abnormal menstrual cycle 07/27/2010   Anemia    Anxiety    ASCUS with positive high risk HPV 07/14/2009   Cancer (HCC) 2008   Bladder   Depression    Dysrhythmia    Essential hypertension 03/03/2021   Exertional dyspnea 03/03/2021   Genetic testing 09/27/2022   H/O rubella    H/O varicella    Heart murmur    History of varicose veins    Hyperlipidemia    Low iron    Mitral valve prolapse 05/03/2015   PAC (premature atrial contraction) 05/23/2022   Palpitations 05/03/2015   Sleep apnea      Past Surgical History:  Procedure Laterality Date   BLADDER SURGERY  2008   cancer - no chemo, no urostomy   CERVICAL BIOPSY  W/ LOOP ELECTRODE EXCISION     COLPOSCOPY     CYSTOSCOPY     LEEP     LEEP  11/24/2010   Procedure: LOOP ELECTROSURGICAL EXCISION PROCEDURE (LEEP);  Surgeon: Susan Litter, MD;  Location: [facility];  Service: Gynecology;  Laterality: N/A;   tubes in ear   1977   WISDOM TOOTH EXTRACTION  1991     Family History  Problem Relation Age of Onset   Hypertension Mother    Breast cancer Mother 51   Lung cancer Mother 39       died at 9; smoking hx   Alcohol abuse Mother    Cancer Mother    Hyperlipidemia Father    Arrhythmia Father        atrial fibrillation   Lung cancer Father 73   Heart disease Father    Hypertension Father    Stroke Father    Varicose Veins Father    Heart attack Father    Cancer Father    Breast cancer Maternal Grandmother        dx 49s   Cancer Maternal Grandmother    Kidney disease Maternal Grandfather    Early death  Maternal Grandfather 30   Dementia Paternal Grandmother 72   Varicose Veins Paternal Grandmother    Colon cancer Paternal Grandfather 55   Heart disease Paternal Grandfather    Skin cancer Paternal Grandfather 49   Alcohol abuse Paternal Grandfather    Cancer Paternal Grandfather    Basal cell carcinoma Paternal Aunt     Social History   Tobacco Use   Smoking status: Never   Smokeless tobacco: Never  Vaping Use   Vaping status: Never Used  Substance Use Topics   Alcohol use: Yes    Comment: 4 shots of liquor per year   Drug use: No    Review of Systems:   Review of Systems  Constitutional:  Negative for chills, fever, malaise/fatigue and weight loss.  HENT:  Negative for hearing loss, sinus pain and sore throat.   Respiratory:  Negative for cough and hemoptysis.   Cardiovascular:  Negative for chest pain, palpitations, leg swelling and PND.  Gastrointestinal:  Negative for abdominal pain, constipation, diarrhea, heartburn, nausea and vomiting.  Genitourinary:  Negative for dysuria, frequency and urgency.  Musculoskeletal:  Negative for back pain, myalgias and neck pain.  Skin:  Negative for itching and rash.  Endo/Heme/Allergies:  Negative for polydipsia.  Psychiatric/Behavioral:  Negative for depression. The patient is not nervous/anxious.     Objective:   BP 130/86 (BP Location: Left Arm, Patient Position: Sitting, Cuff Size: Large)   Pulse (!) 54   Temp 97.8 F (36.6 C) (Temporal)   Ht 5' 1.5" (1.562 m)   Wt 188 lb (85.3 kg)   LMP 12/18/2022 (Exact Date)   SpO2 98%   BMI 34.95 kg/m  Body mass index is 34.95 kg/m.   General Appearance:    Alert, cooperative, no distress, appears stated age  Head:    Normocephalic, without obvious abnormality, atraumatic  Eyes:    PERRL, conjunctiva/corneas clear, EOM's intact, fundi    benign, both eyes  Ears:    Normal TM's and external ear canals, both ears  Nose:   Nares normal, septum midline, mucosa normal, no  drainage    or sinus tenderness  Throat:   Lips, mucosa, and tongue normal; teeth and gums normal  Neck:   Supple, symmetrical, trachea midline, no adenopathy;    thyroid:  no enlargement/tenderness/nodules; no carotid   bruit or JVD  Back:     Symmetric, no curvature, ROM normal, no CVA tenderness  Lungs:     Clear to auscultation bilaterally, respirations unlabored  Chest Wall:    No tenderness or deformity   Heart:    Regular rate and rhythm, S1 and S2 normal, no murmur, rub or gallop  Breast Exam:    Deferred  Abdomen:     Soft, non-tender, bowel sounds active all four quadrants,    no masses, no organomegaly  Genitalia:    Deferred  Extremities:   Extremities normal, atraumatic, no cyanosis or edema  Pulses:   2+ and symmetric all extremities  Skin:   Skin color, texture, turgor normal, no rashes or lesions  Lymph nodes:   Cervical, supraclavicular, and axillary nodes normal  Neurologic:   CNII-XII intact, normal strength, sensation and reflexes    throughout    Assessment/Plan:   Routine physical examination Today patient counseled on age appropriate routine health concerns for screening and prevention, each reviewed and up to date or declined. Immunizations reviewed and up to date or declined. Labs ordered and reviewed. Risk factors for depression reviewed and negative. Hearing function and visual acuity are intact. ADLs screened and addressed as needed. Functional ability and level of safety reviewed and appropriate. Education, counseling and referrals performed based on assessed risks today. Patient provided with a copy of personalized plan for preventive services.  Essential hypertension Normotensive Continue metoprolol 25 mg xl daily Follow-up in 1 year, sooner if concerns  Mixed hyperlipidemia Update lipid panel and adjust Lipitor 20 mg daily as indicated  Need for prophylactic vaccination with combined diphtheria-tetanus-pertussis (DTP) vaccine Update  TDap  Adjustment reaction with anxiety and depression Well controlled Will continue Prozac 10 mg daily and I will take over this medication for her  Encounter for screening for other viral diseases Update hepatitis C  History of bladder cancer Will check her yearly urinalysis  If any concerns, low threshold to refer back to urology  Will have her reach out to her gynecologist to discuss results of Tyrer-Cuzick score  Obesity, unspecified classification, unspecified obesity type, unspecified whether serious comorbidity present [place] candidate for Wegovy or Zepbound -- she will message [place] after she obtains new insurance to send this in  - we can send in without another visit, will have her follow-up with [place] 1 month after starting  I,Verona Buck,acting as a scribe for [employer], PA.,have documented all relevant documentation on the behalf of Susan Motto, PA,as directed by  Susan Motto, PA while in the presence of Susan Cordova, [place].  I, Susan Cordova, [place], have reviewed all documentation for this visit. The documentation on 12/28/22 for the exam, diagnosis, procedures, and orders are all accurate and complete.  Susan Motto, PA-C [facility]

## 2022-12-29 LAB — HEPATITIS C ANTIBODY: Hepatitis C Ab: NONREACTIVE

## 2022-12-31 ENCOUNTER — Encounter: Payer: Self-pay | Admitting: Physician Assistant

## 2022-12-31 NOTE — Telephone Encounter (Signed)
Please see pt response for l;abs and Urology. Sent Jarold Song request for Colonoscopy results.

## 2023-01-08 DIAGNOSIS — F4310 Post-traumatic stress disorder, unspecified: Secondary | ICD-10-CM | POA: Diagnosis not present

## 2023-01-08 DIAGNOSIS — F411 Generalized anxiety disorder: Secondary | ICD-10-CM | POA: Diagnosis not present

## 2023-01-08 DIAGNOSIS — F331 Major depressive disorder, recurrent, moderate: Secondary | ICD-10-CM | POA: Diagnosis not present

## 2023-01-09 ENCOUNTER — Ambulatory Visit (HOSPITAL_BASED_OUTPATIENT_CLINIC_OR_DEPARTMENT_OTHER): Payer: BC Managed Care – PPO | Admitting: Cardiovascular Disease

## 2023-01-09 ENCOUNTER — Encounter (HOSPITAL_BASED_OUTPATIENT_CLINIC_OR_DEPARTMENT_OTHER): Payer: Self-pay | Admitting: Cardiovascular Disease

## 2023-01-09 VITALS — BP 146/80 | HR 63 | Ht 61.5 in | Wt 191.0 lb

## 2023-01-09 DIAGNOSIS — I10 Essential (primary) hypertension: Secondary | ICD-10-CM | POA: Diagnosis not present

## 2023-01-09 DIAGNOSIS — I491 Atrial premature depolarization: Secondary | ICD-10-CM

## 2023-01-09 DIAGNOSIS — G4733 Obstructive sleep apnea (adult) (pediatric): Secondary | ICD-10-CM

## 2023-01-09 DIAGNOSIS — E782 Mixed hyperlipidemia: Secondary | ICD-10-CM | POA: Diagnosis not present

## 2023-01-09 NOTE — Progress Notes (Deleted)
Cardiology Office Note:  .   Date:  01/09/2023  ID:  Susan Cordova, DOB 10/24/1970, MRN [id number] PCP: [name], PA  [facility] Providers Cardiologist:  Chilton Si, MD { Click to update primary MD,subspecialty MD or APP then REFRESH:1}   History of Present Illness: .   Susan Cordova is a 52 y.o. female with HTN, hyperlipidemia and palpitations who is here for follow up. She was first seen in 2017 for an evaluation of palpitations.  Susan Cordova noted palpitations for the preceding 2 years.  Episodes occurred once per day or once every other day and lasted for 1-2 seconds at a time.  She wore an ambulatory monitor that revealed some PACs and PVCs.  She had an echo with LVEF 55 to 60% and no mitral valve prolapse.  PFO could not be excluded.  She was diagnosed with hypertension and was started on metoprolol due to both hypertension and palpitations.   On 03/03/21, she reported that she occasionally checked her BP at home and her BP had been as high as 150-170s. She reported lower readings in the AM. She was continued on Metoprolol 25 MG. She had a lot of palpitations and tightness in her chest at the time. Her heart rate that day was 61 on metoprolol 25 mg. Therefore, the dose could not be titrated, but pt could take extra half tablet prn. EKG showed sinus rhythm, Rate 61 bpm. She had noted that she is more short of breath with exertion. A coronary CTA was ordered to evaluate for CAD, which resulted a coronary calcium score of 0 and no evidence of CAD. She had labs checked 11/2020 and her hemoglobin, electrolytes, and TSH were all within normal limits.  She had an echo at [facility] 04/2020 with LVEF 60% and trace mitral regurgitation.   She followed-up with Alver Sorrow, NP on 04/07/2021 after cardiac CTA. She reported a brief episode of chest pain at rest the day before, which lasted a few seconds. Her palpitations were well controlled on her Metoprolol dose. 11/2020 LDL 62.  Atorvastatin 20mg  QD was continued.  At her visit 04/2022 blood pressure was elevated in the office but controlled at home.  She was under stress after her father's recent passing.  She followed up with Gillian Shields, NP 06/2022 was doing well.  Stretch out follow up  ROS: ***  Studies Reviewed: .       Echo 05/27/15: Study Conclusions   - Left ventricle: The cavity size was normal. Systolic function was    normal. The estimated ejection fraction was in the range of 55%    to 60%. Wall motion was normal; there were no regional wall    motion abnormalities.  - Mitral valve: No significant prolapse noted.  - Atrial septum: A patent foramen ovale cannot be excluded.  - Impressions: Normal GLS -20.6.   Impressions:   - Normal GLS -20.6.   *** Risk Assessment/Calculations:   {Does this patient have ATRIAL FIBRILLATION?:[id number]} No BP recorded.  {Refresh Note OR Click here to enter BP  :1}***       Physical Exam:   VS:  LMP 12/18/2022 (Exact Date)    Wt Readings from Last 3 Encounters:  12/28/22 188 lb (85.3 kg)  06/22/22 184 lb (83.5 kg)  05/23/22 183 lb 4.8 oz (83.1 kg)    GEN: Well nourished, well developed in no acute distress NECK: No JVD; No carotid bruits CARDIAC: ***RRR, no murmurs, rubs, gallops RESPIRATORY:  Clear  to auscultation without rales, wheezing or rhonchi  ABDOMEN: Soft, non-tender, non-distended EXTREMITIES:  No edema; No deformity   ASSESSMENT AND PLAN: .   ***    {Are you ordering a CV Procedure (e.g. stress test, cath, DCCV, TEE, etc)?   Press F2        :[id number]}  Dispo: ***  Signed, Chilton Si, MD

## 2023-01-09 NOTE — Patient Instructions (Signed)
Medication Instructions:  Continue current medications  *If you need a refill on your cardiac medications before your next appointment, please call your pharmacy*   Lab Work: None Ordered   Testing/Procedures: None Ordered   Follow-Up: At [facility], you and your health needs are our priority.  As part of our continuing mission to provide you with exceptional heart care, we have created designated Provider Care Teams.  These Care Teams include your primary Cardiologist (physician) and Advanced Practice Providers (APPs -  Physician Assistants and Nurse Practitioners) who all work together to provide you with the care you need, when you need it.  We recommend signing up for the patient portal called "MyChart".  Sign up information is provided on this After Visit Summary.  MyChart is used to connect with patients for Virtual Visits (Telemedicine).  Patients are able to view lab/test results, encounter notes, upcoming appointments, etc.  Non-urgent messages can be sent to your provider as well.   To learn more about what you can do with MyChart, go to [email or web address].    Your next appointment:   6 month(s)  Provider:   Gillian Shields, NP   1 Year with Dr Duke Salvia   Other Instructions Continue exercise therapy Limit sodium intake

## 2023-01-09 NOTE — Progress Notes (Signed)
Cardiology Office Note:  .    Date:  01/11/2023  ID:  Susan Cordova, DOB 05-15-1970, MRN [id number] PCP: [name], PA  [facility] Providers Cardiologist:  Chilton Si, MD     History of Present Illness: .    Susan Cordova is a 52 y.o. female with HTN, hyperlipidemia and palpitations who is here for follow up. She was first seen in 2017 for an evaluation of palpitations.  Ms. Suchan noted palpitations for the preceding 2 years.  Episodes occurred once per day or once every other day and lasted for 1-2 seconds at a time.  She wore an ambulatory monitor that revealed some PACs and PVCs.  She had an echo with LVEF 55 to 60% and no mitral valve prolapse.  PFO could not be excluded.  She was diagnosed with hypertension and was started on metoprolol due to both hypertension and palpitations.   On 03/03/21, she reported that she occasionally checked her BP at home and her BP had been as high as 150-170s. She reported lower readings in the AM. She was continued on Metoprolol 25 MG. She had a lot of palpitations and tightness in her chest at the time. Her heart rate that day was 61 on metoprolol 25 mg. Therefore, the dose could not be titrated, but pt could take extra half tablet prn. EKG showed sinus rhythm, Rate 61 bpm. She had noted that she is more short of breath with exertion. A coronary CTA was ordered to evaluate for CAD, which resulted a coronary calcium score of 0 and no evidence of CAD. She had labs checked 11/2020 and her hemoglobin, electrolytes, and TSH were all within normal limits.  She had an echo at [facility] 04/2020 with LVEF 60% and trace mitral regurgitation.   She followed-up with Alver Sorrow, NP on 04/07/2021 after cardiac CTA. She reported a brief episode of chest pain at rest the day before, which lasted a few seconds. Her palpitations were well controlled on her Metoprolol dose. 11/2020 LDL 62. Atorvastatin 20mg  QD was continued.  At her visit 04/2022 blood  pressure was elevated in the office but controlled at home.  She was under stress after her father's recent passing.  She followed up with Gillian Shields, NP 06/2022 was doing well.   Today, in the office her blood pressure is 149/85 initially, and 146/80 on manual recheck. She presents a BP log which is personally reviewed. In the mornings her readings are consistently below 130/80, averaging 110s-120s systolic. However, her evening readings are ranging from the 120s-140s systolic. Typically she takes her metoprolol in the mornings. Currently she has rare palpitations, now less bothersome overall since she knows they are benign. Resting heart rates have been in the 50's. She is exercising daily as she now owns a dog. She is planning to work on increasing her exercise for more intensive cardio workouts. She is working with a provider regarding her mental health which has prevented her from exercising as much as she would like. Mostly she is preparing her meals at home. She denies any chest pain, shortness of breath, peripheral edema, lightheadedness, headaches, syncope, orthopnea, or PND.  ROS:  Please see the history of present illness. All other systems are reviewed and negative.  (+) Rare palpitations  Studies Reviewed: .        Risk Assessment/Calculations:        Physical Exam:    VS:  BP (!) 146/80 (BP Location: Right Arm, Patient Position: Sitting, Cuff  Size: Large)   Pulse 63   Ht 5' 1.5" (1.562 m)   Wt 191 lb (86.6 kg)   LMP 12/18/2022 (Exact Date)   SpO2 100%   BMI 35.50 kg/m  , BMI Body mass index is 35.5 kg/m. GENERAL:  Well appearing HEENT: Pupils equal round and reactive, fundi not visualized, oral mucosa unremarkable NECK:  No jugular venous distention, waveform within normal limits, carotid upstroke brisk and symmetric, no bruits, no thyromegaly LUNGS:  Clear to auscultation bilaterally HEART:  RRR.  PMI not displaced or sustained,S1 and S2 within normal limits, no S3, no  S4, no clicks, no rubs, III/VI systolic murmurs ABD:  Flat, positive bowel sounds normal in frequency in pitch, no bruits, no rebound, no guarding, no midline pulsatile mass, no hepatomegaly, no splenomegaly EXT:  2 plus pulses throughout, no edema, no cyanosis no clubbing SKIN:  No rashes no nodules NEURO:  Cranial nerves II through XII grossly intact, motor grossly intact throughout PSYCH:  Cognitively intact, oriented to person place and time  Wt Readings from Last 3 Encounters:  01/09/23 191 lb (86.6 kg)  12/28/22 188 lb (85.3 kg)  06/22/22 184 lb (83.5 kg)     ASSESSMENT AND PLAN: .    # Hypertension Blood pressures variable, with some readings above goal of 130/80. Currently on Metoprolol 25mg  daily. Discussed the possibility of adding a low dose of Hydrochlorothiazide (HCTZ) to Metoprolol, but decided to focus on lifestyle modifications first due to concerns about potential side effects and patient's preference. -Continue Metoprolol 25mg  daily. -Encourage lifestyle modifications including regular exercise, low sodium diet, and consistent medication adherence. -Monitor blood pressure at home three times a week. -Follow up with nurse practitioner in six months.  # Hyperlipidemia Well controlled on statin therapy. -Continue current regimen.  # Mental Health Patient reports ongoing mental health issues, currently seeing a provider and on a low dose of Prozac. -Continue current mental health treatment plan.         Dispo:  FU with APP in 6 months. FU with Casimer Russett C. Duke Salvia, MD, [facility] in 1 year.  I,Mathew Stumpf,acting as a Neurosurgeon for Chilton Si, MD.,have documented all relevant documentation on the behalf of Chilton Si, MD,as directed by  Chilton Si, MD while in the presence of Chilton Si, MD.  I, Analeigha Nauman C. Duke Salvia, MD have reviewed all documentation for this visit.  The documentation of the exam, diagnosis, procedures, and orders on 01/11/2023 are all  accurate and complete.   Signed, Chilton Si, MD

## 2023-01-11 ENCOUNTER — Encounter (HOSPITAL_BASED_OUTPATIENT_CLINIC_OR_DEPARTMENT_OTHER): Payer: Self-pay | Admitting: Cardiovascular Disease

## 2023-01-30 DIAGNOSIS — D2261 Melanocytic nevi of right upper limb, including shoulder: Secondary | ICD-10-CM | POA: Diagnosis not present

## 2023-01-30 DIAGNOSIS — L821 Other seborrheic keratosis: Secondary | ICD-10-CM | POA: Diagnosis not present

## 2023-01-30 DIAGNOSIS — L814 Other melanin hyperpigmentation: Secondary | ICD-10-CM | POA: Diagnosis not present

## 2023-01-30 DIAGNOSIS — D2262 Melanocytic nevi of left upper limb, including shoulder: Secondary | ICD-10-CM | POA: Diagnosis not present

## 2023-01-31 DIAGNOSIS — F4310 Post-traumatic stress disorder, unspecified: Secondary | ICD-10-CM | POA: Diagnosis not present

## 2023-01-31 DIAGNOSIS — F331 Major depressive disorder, recurrent, moderate: Secondary | ICD-10-CM | POA: Diagnosis not present

## 2023-01-31 DIAGNOSIS — F411 Generalized anxiety disorder: Secondary | ICD-10-CM | POA: Diagnosis not present

## 2023-02-27 DIAGNOSIS — F411 Generalized anxiety disorder: Secondary | ICD-10-CM | POA: Diagnosis not present

## 2023-02-27 DIAGNOSIS — F4310 Post-traumatic stress disorder, unspecified: Secondary | ICD-10-CM | POA: Diagnosis not present

## 2023-02-27 DIAGNOSIS — F331 Major depressive disorder, recurrent, moderate: Secondary | ICD-10-CM | POA: Diagnosis not present

## 2023-04-02 ENCOUNTER — Other Ambulatory Visit: Payer: Self-pay | Admitting: Family Medicine

## 2023-04-02 DIAGNOSIS — Z1231 Encounter for screening mammogram for malignant neoplasm of breast: Secondary | ICD-10-CM

## 2023-04-08 NOTE — Progress Notes (Signed)
52 y.o. G0P0000 Single Caucasian female here for annual exam.    Receiving care for PTSD.  Sees a therapist. Taking Prozac for anxiety and depression.  Susan Cordova will take over this medication.   Hx bladder cancer.  Saw her PCP this fall and had some blood in her urine at that time.  She was on her period.  Today she wants her urine rechecked routinely. No dysuria.   Having a menstrual cycle every month.  Not skipping cycles, so she is not taking the Provera.  Had one cycle that lasted 2 weeks.   On leave from [employer].  May be retiring.   Both parents are deceased.   Ashkenazi Jewish background.  FH of breast and colon cancer.  Negative genetic testing.   PCP: Susan Motto, PA   Patient's last menstrual period was 04/09/2023.     Period Cycle (Days): 28 Period Duration (Days): 4-5 Menstrual Flow: Light, Heavy Menstrual Control: Other (Comment) Dysmenorrhea: None     Sexually active: No.  The current method of family planning is abstinence.    Menopausal hormone therapy:  n/a Exercising: Yes.     walking Smoker:  no  OB History  Gravida Para Term Preterm AB Living  0 0 0 0 0 0  SAB IAB Ectopic Multiple Live Births  0 0 0 0 0     HEALTH MAINTENANCE: Last 2 paps:  04/09/22 neg: HR HPV neg, 02/05/17 neg: HR HPV neg History of abnormal Pap or positive HPV:  yes, 06/2009 ASCUS + HR HPV Leep in 2012. Final pathology CIN I.  Mammogram:   05/15/22 Breast density cat B, BI-RADS CAT 1 neg.  Has appointment in January.  Colonoscopy:  06/2021 normal Bone Density:  n/a  Result  n/a   Immunization History  Administered Date(s) Administered   Hep A, Unspecified 08/29/2012   Hepatitis A 08/29/2012   Influenza Inj Mdck Quad Pf 02/04/2019   Influenza,inj,Quad PF,6+ Mos 01/08/2014, 01/24/2018, 01/05/2020   Influenza-Unspecified 02/22/2016, 01/21/2017, 01/22/2019, 02/01/2022   PFIZER(Purple Top)SARS-COV-2 Vaccination 06/25/2019, 07/23/2019, 01/29/2020,  08/18/2020, 02/01/2021   Pfizer(Comirnaty)Fall Seasonal Vaccine 12 years and older 02/09/2022   Td (Adult),5 Lf Tetanus Toxid, Preservative Free 09/24/1994   Tdap 09/12/2012, 12/28/2022   Typhoid Inactivated 08/29/2012   Zoster Recombinant(Shingrix) 03/21/2020, 06/03/2020      reports that she has never smoked. She has never used smokeless tobacco. She reports current alcohol use. She reports that she does not use drugs.  Past Medical History:  Diagnosis Date   Abnormal menstrual cycle 07/27/2010   Anemia    Anxiety    ASCUS with positive high risk HPV 07/14/2009   Cancer (HCC) 2008   Bladder   Depression    Dysrhythmia    Essential hypertension 03/03/2021   Exertional dyspnea 03/03/2021   Genetic testing 09/27/2022   H/O rubella    H/O varicella    Heart murmur    History of varicose veins    Hyperlipidemia    Low iron    Mitral valve prolapse 05/03/2015   PAC (premature atrial contraction) 05/23/2022   Palpitations 05/03/2015   Sleep apnea     Past Surgical History:  Procedure Laterality Date   BLADDER SURGERY  2008   cancer - no chemo, no urostomy   CERVICAL BIOPSY  W/ LOOP ELECTRODE EXCISION     COLPOSCOPY     CYSTOSCOPY     LEEP     LEEP  11/24/2010   Procedure: LOOP ELECTROSURGICAL EXCISION PROCEDURE (  LEEP);  Surgeon: Michael Litter, MD;  Location: [facility];  Service: Gynecology;  Laterality: N/A;   tubes in ear   1977   WISDOM TOOTH EXTRACTION  1991    Current Outpatient Medications  Medication Sig Dispense Refill   atorvastatin (LIPITOR) 20 MG tablet Take 1 tablet (20 mg total) by mouth daily. 90 tablet 3   Cholecalciferol (VITAMIN D) 50 MCG (2000 UT) tablet Take 1 tablet by mouth daily.     Ferrous Fumarate (IRON) 18 MG TBCR Take by mouth. 4-5 x's per month with menstrual cycle     FLUoxetine (PROZAC) 10 MG capsule Take 1 capsule (10 mg total) by mouth daily. 90 capsule 1   hydrOXYzine (VISTARIL) 25 MG capsule 25 mg as needed.     meclizine (ANTIVERT) 25  MG tablet Take 1 tablet by mouth as needed.     medroxyPROGESTERone (PROVERA) 5 MG tablet Take one tablet a day for 5 days every 1-2 months if no spontaneous menses. 15 tablet 3   metoprolol succinate (TOPROL-XL) 25 MG 24 hr tablet TAKE 1 TABLET (25 MG TOTAL) BY MOUTH DAILY. 90 tablet 2   No current facility-administered medications for this visit.    ALLERGIES: Patient has no known allergies.  Family History  Problem Relation Age of Onset   Hypertension Mother    Breast cancer Mother 13   Lung cancer Mother 88       died at 18; smoking hx   Alcohol abuse Mother    Cancer Mother    Hyperlipidemia Father    Arrhythmia Father        atrial fibrillation   Lung cancer Father 58   Heart disease Father    Hypertension Father    Stroke Father    Varicose Veins Father    Heart attack Father    Cancer Father    Breast cancer Maternal Grandmother        dx 104s   Cancer Maternal Grandmother    Kidney disease Maternal Grandfather    Early death Maternal Grandfather 69   Dementia Paternal Grandmother 81   Varicose Veins Paternal Grandmother    Colon cancer Paternal Grandfather 8   Heart disease Paternal Grandfather    Skin cancer Paternal Grandfather 10   Alcohol abuse Paternal Grandfather    Cancer Paternal Grandfather    Basal cell carcinoma Paternal Aunt     Review of Systems  All other systems reviewed and are negative.   PHYSICAL EXAM:  BP 132/84 (BP Location: Left Arm, Patient Position: Sitting, Cuff Size: Small)   Pulse (!) 57   Ht 5\' 3"  (1.6 m)   Wt 194 lb (88 kg)   LMP 04/09/2023   SpO2 100%   BMI 34.37 kg/m     General appearance: alert, cooperative and appears stated age Head: normocephalic, without obvious abnormality, atraumatic Neck: no adenopathy, supple, symmetrical, trachea midline and thyroid normal to inspection and palpation Lungs: clear to auscultation bilaterally Breasts: normal appearance, no masses or tenderness, No nipple retraction or dimpling,  No nipple discharge or bleeding, No axillary adenopathy Heart: regular rhythm, bradycardia.  (On betablocker.) Abdomen: soft, non-tender; no masses, no organomegaly Extremities: extremities normal, atraumatic, no cyanosis or edema Skin: skin color, texture, turgor normal. No rashes or lesions Lymph nodes: cervical, supraclavicular, and axillary nodes normal. Neurologic: grossly normal  Pelvic: External genitalia:  no lesions              No abnormal inguinal nodes palpated.  Urethra:  normal appearing urethra with no masses, tenderness or lesions              Bartholins and Skenes: normal                 Vagina: normal appearing vagina with normal color and discharge, no lesions              Cervix: no lesions.  Consistent with LEEP.               Pap taken: No. Bimanual Exam:  Uterus:  normal size, contour, position, consistency, mobility, non-tender              Adnexa: no mass, fullness, tenderness              Rectal exam: Yes.  .  Confirms.              Anus:  normal sphincter tone, no lesions  Chaperone was present for exam:  Warren Lacy, CMA  ASSESSMENT: Well woman visit with gynecologic exam Hx LEEP, 2012.  Hx bladder cancer.  Treated with excision.  Recent hematuria.  FH breast cancer.  Negative personal genetic testing.  TC lifetime risk of breast cancer 19%.  (It may be lower due to her negative genetic testing.)  PLAN: Mammogram screening discussed. Self breast awareness reviewed. Pap and HRV collected:  No.  Due in 2026.  Guidelines for Calcium, Vitamin D, regular exercise program including cardiovascular and weight bearing exercise. Medication refills:  NA Urinalysis and reflex culture.  No treatment for UTI at this time.  Labs with PCP.  Follow up:  1 year and prn.

## 2023-04-15 ENCOUNTER — Encounter: Payer: Self-pay | Admitting: Physician Assistant

## 2023-04-15 MED ORDER — FLUOXETINE HCL 10 MG PO CAPS: 10.0000 mg | ORAL_CAPSULE | Freq: Every day | ORAL | 1 refills | Status: DC

## 2023-04-22 ENCOUNTER — Ambulatory Visit (INDEPENDENT_AMBULATORY_CARE_PROVIDER_SITE_OTHER): Payer: BC Managed Care – PPO | Admitting: Obstetrics and Gynecology

## 2023-04-22 ENCOUNTER — Encounter: Payer: Self-pay | Admitting: Obstetrics and Gynecology

## 2023-04-22 VITALS — BP 132/84 | HR 57 | Ht 63.0 in | Wt 194.0 lb

## 2023-04-22 DIAGNOSIS — R319 Hematuria, unspecified: Secondary | ICD-10-CM

## 2023-04-22 DIAGNOSIS — Z01419 Encounter for gynecological examination (general) (routine) without abnormal findings: Secondary | ICD-10-CM | POA: Diagnosis not present

## 2023-04-22 NOTE — Patient Instructions (Signed)

## 2023-04-24 LAB — URINALYSIS, COMPLETE W/RFL CULTURE
Bilirubin Urine: NEGATIVE
Casts: NONE SEEN /[LPF]
Crystals: NONE SEEN /HPF
Glucose, UA: NEGATIVE
Hgb urine dipstick: NEGATIVE
Ketones, ur: NEGATIVE
Leukocyte Esterase: NEGATIVE
Nitrites, Initial: NEGATIVE
Protein, ur: NEGATIVE
RBC / HPF: NONE SEEN /HPF (ref 0–2)
Specific Gravity, Urine: 1.003 (ref 1.001–1.035)
Yeast: NONE SEEN /HPF
pH: 5.5 (ref 5.0–8.0)

## 2023-04-24 LAB — CULTURE INDICATED

## 2023-04-24 LAB — URINE CULTURE
MICRO NUMBER:: 15899743
Result:: NO GROWTH
SPECIMEN QUALITY:: ADEQUATE

## 2023-05-06 ENCOUNTER — Other Ambulatory Visit (HOSPITAL_BASED_OUTPATIENT_CLINIC_OR_DEPARTMENT_OTHER): Payer: Self-pay

## 2023-05-06 ENCOUNTER — Other Ambulatory Visit (HOSPITAL_BASED_OUTPATIENT_CLINIC_OR_DEPARTMENT_OTHER): Payer: Self-pay | Admitting: Cardiovascular Disease

## 2023-05-06 MED ORDER — METOPROLOL SUCCINATE ER 25 MG PO TB24: 25.0000 mg | ORAL_TABLET | Freq: Every day | ORAL | 1 refills | Status: DC

## 2023-05-21 ENCOUNTER — Other Ambulatory Visit: Payer: Self-pay | Admitting: Physician Assistant

## 2023-05-21 ENCOUNTER — Ambulatory Visit
Admission: RE | Admit: 2023-05-21 | Discharge: 2023-05-21 | Disposition: A | Discharge status: Home / Self Care | Payer: BC Managed Care – PPO | Source: Ambulatory Visit | Attending: Family Medicine | Admitting: Family Medicine

## 2023-05-21 DIAGNOSIS — Z1231 Encounter for screening mammogram for malignant neoplasm of breast: Secondary | ICD-10-CM

## 2023-05-22 DIAGNOSIS — F411 Generalized anxiety disorder: Secondary | ICD-10-CM | POA: Diagnosis not present

## 2023-05-22 DIAGNOSIS — F4310 Post-traumatic stress disorder, unspecified: Secondary | ICD-10-CM | POA: Diagnosis not present

## 2023-05-22 DIAGNOSIS — F331 Major depressive disorder, recurrent, moderate: Secondary | ICD-10-CM | POA: Diagnosis not present

## 2023-05-23 ENCOUNTER — Encounter: Payer: Self-pay | Admitting: Obstetrics and Gynecology

## 2023-05-27 ENCOUNTER — Other Ambulatory Visit: Payer: Self-pay

## 2023-05-27 MED ORDER — METOPROLOL SUCCINATE ER 25 MG PO TB24: 25.0000 mg | ORAL_TABLET | Freq: Every day | ORAL | 0 refills | Status: DC

## 2023-05-27 MED ORDER — ATORVASTATIN CALCIUM 20 MG PO TABS: 20.0000 mg | ORAL_TABLET | Freq: Every day | ORAL | 0 refills | Status: DC

## 2023-06-04 ENCOUNTER — Other Ambulatory Visit (HOSPITAL_BASED_OUTPATIENT_CLINIC_OR_DEPARTMENT_OTHER): Payer: Self-pay | Admitting: *Deleted

## 2023-06-04 MED ORDER — ATORVASTATIN CALCIUM 20 MG PO TABS
20.0000 mg | ORAL_TABLET | Freq: Every day | ORAL | 3 refills | Status: DC
Start: 2023-06-04 — End: 2023-09-03

## 2023-06-06 DIAGNOSIS — G4733 Obstructive sleep apnea (adult) (pediatric): Secondary | ICD-10-CM | POA: Diagnosis not present

## 2023-06-13 DIAGNOSIS — F4310 Post-traumatic stress disorder, unspecified: Secondary | ICD-10-CM | POA: Diagnosis not present

## 2023-06-13 DIAGNOSIS — F411 Generalized anxiety disorder: Secondary | ICD-10-CM | POA: Diagnosis not present

## 2023-06-13 DIAGNOSIS — F331 Major depressive disorder, recurrent, moderate: Secondary | ICD-10-CM | POA: Diagnosis not present

## 2023-07-10 DIAGNOSIS — F4310 Post-traumatic stress disorder, unspecified: Secondary | ICD-10-CM | POA: Diagnosis not present

## 2023-07-10 DIAGNOSIS — F331 Major depressive disorder, recurrent, moderate: Secondary | ICD-10-CM | POA: Diagnosis not present

## 2023-07-10 DIAGNOSIS — F411 Generalized anxiety disorder: Secondary | ICD-10-CM | POA: Diagnosis not present

## 2023-07-18 ENCOUNTER — Other Ambulatory Visit: Payer: Self-pay | Admitting: *Deleted

## 2023-07-18 MED ORDER — FLUOXETINE HCL 10 MG PO CAPS: 10.0000 mg | ORAL_CAPSULE | Freq: Every day | ORAL | 1 refills | Status: DC

## 2023-07-23 ENCOUNTER — Ambulatory Visit (HOSPITAL_BASED_OUTPATIENT_CLINIC_OR_DEPARTMENT_OTHER): Payer: BC Managed Care – PPO | Admitting: Family

## 2023-07-30 ENCOUNTER — Encounter (HOSPITAL_BASED_OUTPATIENT_CLINIC_OR_DEPARTMENT_OTHER): Payer: Self-pay | Admitting: Family

## 2023-07-30 ENCOUNTER — Ambulatory Visit (HOSPITAL_BASED_OUTPATIENT_CLINIC_OR_DEPARTMENT_OTHER): Payer: BC Managed Care – PPO | Admitting: Family

## 2023-07-30 VITALS — BP 140/80 | HR 58 | Ht 61.0 in | Wt 197.0 lb

## 2023-07-30 DIAGNOSIS — I10 Essential (primary) hypertension: Secondary | ICD-10-CM

## 2023-07-30 DIAGNOSIS — E782 Mixed hyperlipidemia: Secondary | ICD-10-CM

## 2023-07-30 DIAGNOSIS — R002 Palpitations: Secondary | ICD-10-CM

## 2023-07-30 MED ORDER — METOPROLOL SUCCINATE ER 25 MG PO TB24: 25.0000 mg | ORAL_TABLET | ORAL | Status: AC | PRN

## 2023-07-30 MED ORDER — AMLODIPINE BESYLATE 5 MG PO TABS: 5.0000 mg | ORAL_TABLET | Freq: Every day | ORAL | 3 refills | Status: DC

## 2023-07-30 NOTE — Progress Notes (Signed)
 Cardiology Office Note:  .   Date:  07/30/2023  ID:  [name], DOB [birth year], MRN [id number] PCP: [name], PA  [facility] Providers Cardiologist:  Chilton Si, MD    History of Present Illness: .   Susan Cordova is a 53 y.o. female with a hx of dyspnea, palpitations, hypertension.   Initially evaluated in 2017 for palpitations.  Ambulatory monitoring revealed some PACs and PVCs.  Echocardiogram with LVEF 55 to 60%, no mitral valve prolapse, PFO not able to be excluded.  She was started on metoprolol for palpitations.  Echocardiogram 04/2020 at [facility] with LVEF 60% trace MR.   She was seen 03/03/2021 noting increasing palpitations, elevated blood pressure at home, increased dyspnea on exertion.  Coronary CTA showed a coronary calcium score of 0.     Last seen 01/09/23 with variable blood pressure. She was recommended to continue lifestyle changes prior to consideration of medication changes. Metoprolol 25mg  daily was continued.  Presents today for follow up. She has recently essentially retired. She has had a rare palpitations and reports they are not bothersome. Her average home BP over the last 14 readings is 134/76. She does note trying to lower her BP through lifestyle changes for a year now and thinks it is time to try a different medication as BP persistently elevated. She has been working on increasing her activity.  Reports no shortness of breath nor dyspnea on exertion. Reports no chest pain, pressure, or tightness. No edema, orthopnea, PND.  ROS: Please see the history of present illness.    All other systems reviewed and are negative.   Studies Reviewed: Marland Kitchen   EKG Interpretation Date/Time:  Tuesday July 30 2023 13:24:48 EDT Ventricular Rate:  55 PR Interval:  144 QRS Duration:  84 QT Interval:  412 QTC Calculation: 394 R Axis:   37  Text Interpretation: Sinus bradycardia No acute ST/T wave changes. Confirmed by Gillian Shields ([id number]) on 07/30/2023  1:30:47 PM    Cardiac Studies & Procedures   ______________________________________________________________________________________________     ECHOCARDIOGRAM  ECHOCARDIOGRAM COMPLETE 05/27/2015  Narrative *[facility]* [address] [place], [place] [address] [phone]  ------------------------------------------------------------------- Transthoracic Echocardiography  Patient:    Susan, Cordova MR #:       [id number] Study Date: 05/27/2015 Gender:     F Age:        44 Height:     154.9 cm Weight:     78.9 kg BSA:        1.88 m^2 Pt. Status: Room:  PERFORMING   [facility], Outpatient SONOGRAPHER  [place], RDCS ATTENDING    Chilton Si, MD ORDERING     Chilton Si, MD REFERRING    Chilton Si, MD  cc:  ------------------------------------------------------------------- LV EF: 55% -   60%  ------------------------------------------------------------------- Indications:      MVP (I34.1).  ------------------------------------------------------------------- History:   PMH:  Palpitations  Murmur.  Mitral valve prolapse.  Risk factors:  Family history of coronary artery disease.  ------------------------------------------------------------------- Study Conclusions  - Left ventricle: The cavity size was normal. Systolic function was normal. The estimated ejection fraction was in the range of 55% to 60%. Wall motion was normal; there were no regional wall motion abnormalities. - Mitral valve: No significant prolapse noted. - Atrial septum: A patent foramen ovale cannot be excluded. - Impressions: Normal GLS -20.6.  Impressions:  - Normal GLS -20.6.  ------------------------------------------------------------------- Labs, prior tests, procedures, and surgery: ECG.     Abnormal. Transthoracic echocardiography.  M-mode, complete 2D, spectral Doppler, and color Doppler.  Birthdate:  Patient birthdate: 12/07/1970.  Age:  Patient is 53  yr old.  Sex:  Gender: female. BMI: 32.9 kg/m^2.  Blood pressure:     142/86  Patient status: Outpatient.  Study date:  Study date: 05/27/2015. Study time: 04:11 PM.  Location:  [facility].  -------------------------------------------------------------------  ------------------------------------------------------------------- Left ventricle:  The cavity size was normal. Systolic function was normal. The estimated ejection fraction was in the range of 55% to 60%. Wall motion was normal; there were no regional wall motion abnormalities.  ------------------------------------------------------------------- Aortic valve:   Trileaflet; normal thickness leaflets. Mobility was not restricted.  Doppler:  Transvalvular velocity was within the normal range. There was no stenosis. There was no regurgitation.  ------------------------------------------------------------------- Aorta:  Aortic root: The aortic root was normal in size.  ------------------------------------------------------------------- Mitral valve:  No significant prolapse noted.  Structurally normal valve.   Mobility was not restricted.  Doppler:  Transvalvular velocity was within the normal range. There was no evidence for stenosis. There was trivial regurgitation.    Peak gradient (D): 7 mm Hg.  ------------------------------------------------------------------- Left atrium:  The atrium was normal in size.  ------------------------------------------------------------------- Atrial septum:  A patent foramen ovale cannot be excluded.  ------------------------------------------------------------------- Right ventricle:  The cavity size was normal. Wall thickness was normal. Systolic function was normal.  ------------------------------------------------------------------- Pulmonic valve:    Doppler:  Transvalvular velocity was within the normal range. There was no evidence for stenosis. There was  trivial regurgitation.  ------------------------------------------------------------------- Tricuspid valve:   Structurally normal valve.    Doppler: Transvalvular velocity was within the normal range. There was trivial regurgitation.  ------------------------------------------------------------------- Pulmonary artery:   The main pulmonary artery was normal-sized. Systolic pressure was within the normal range.  ------------------------------------------------------------------- Right atrium:  The atrium was normal in size.  ------------------------------------------------------------------- Pericardium:  The pericardium was normal in appearance. There was no pericardial effusion.  ------------------------------------------------------------------- Systemic veins: Inferior vena cava: The vessel was normal in size. The respirophasic diameter changes were in the normal range (= 50%), consistent with normal central venous pressure. Diameter: 18 mm.  ------------------------------------------------------------------- Measurements  IVC                                   Value        Reference ID                                    18    mm     ---------  Left ventricle                        Value        Reference LV ID, ED, PLAX chordal        (L)    42.9  mm     43 - 52 LV ID, ES, PLAX chordal               23    mm     23 - 38 LV fx shortening, PLAX chordal        46    %      >=29 LV PW thickness, ED                   9.07  mm     ---------  IVS/LV PW ratio, ED                   1.05         <=1.3 Stroke volume, 2D                     89    ml     --------- Stroke volume/bsa, 2D                 47    ml/m^2 --------- LV e&', lateral                        12.7  cm/s   --------- LV E/e&', lateral                      10.55        --------- LV e&', medial                         9.14  cm/s   --------- LV E/e&', medial                       14.66        --------- LV e&', average                         10.92 cm/s   --------- LV E/e&', average                      12.27        --------- Longitudinal strain, TDI              21    %      ---------  Ventricular septum                    Value        Reference IVS thickness, ED                     9.54  mm     ---------  LVOT                                  Value        Reference LVOT ID, S                            20    mm     --------- LVOT area                             3.14  cm^2   --------- LVOT peak velocity, S                 127   cm/s   --------- LVOT mean velocity, S                 92.6  cm/s   --------- LVOT VTI, S                           28.3  cm     --------- LVOT peak gradient, S  6     mm Hg  ---------  Aorta                                 Value        Reference Aortic root ID, ED                    24    mm     ---------  Left atrium                           Value        Reference LA ID, A-P, ES                        35    mm     --------- LA ID/bsa, A-P                        1.86  cm/m^2 <=2.2 LA volume, S                          51.7  ml     --------- LA volume/bsa, S                      27.5  ml/m^2 --------- LA volume, ES, 1-p A4C                48.3  ml     --------- LA volume/bsa, ES, 1-p A4C            25.7  ml/m^2 --------- LA volume, ES, 1-p A2C                54.9  ml     --------- LA volume/bsa, ES, 1-p A2C            29.2  ml/m^2 ---------  Mitral valve                          Value        Reference Mitral E-wave peak velocity           134   cm/s   --------- Mitral A-wave peak velocity           65.6  cm/s   --------- Mitral deceleration time       (L)    144   ms     150 - 230 Mitral peak gradient, D               7     mm Hg  --------- Mitral E/A ratio, peak                2            ---------  Tricuspid valve                       Value        Reference Tricuspid regurg peak velocity        244   cm/s   --------- Tricuspid peak RV-RA gradient          24    mm Hg  ---------  Right ventricle  Value        Reference TAPSE                                 26.8  mm     --------- RV s&', lateral, S                     17.7  cm/s   ---------  Legend: (L)  and  (H)  mark values outside specified reference range.  ------------------------------------------------------------------- Prepared and Electronically Authenticated by  Charlton Haws, M.D. 2017-02-06T18:52:42      CT SCANS  CT CORONARY MORPH W/CTA COR W/SCORE 03/21/2021  Addendum 03/21/2021 12:45 PM ADDENDUM REPORT: 03/21/2021 12:42  CLINICAL DATA:  This is a 53 year old female with anginal symptoms.  EXAM: Cardiac/Coronary  CTA  TECHNIQUE: The patient was scanned on a [employer].  FINDINGS: A 100 kV prospective scan was triggered in the descending thoracic aorta at 111 HU's. Axial non-contrast 3 mm slices were carried out through the heart. The data set was analyzed on a dedicated work station and scored using the Agatson method. Gantry rotation speed was 250 msecs and collimation was .6 mm. No beta blockade and 0.8 mg of sl NTG was given. The 3D data set was reconstructed in 5% intervals of the 67-82 % of the R-R cycle. Diastolic phases were analyzed on a dedicated work station using MPR, MIP and VRT modes. The patient received 80 cc of contrast.  Aorta: Normal size.  No calcifications.  No dissection.  Aortic Valve:  Trileaflet.  No calcifications.  Coronary Arteries:  Normal coronary origin.  Right dominance.  RCA is a large dominant artery that gives rise to PDA and PLA. There is no plaque.  Left main is a large artery that gives rise to LAD and LCX arteries.  LAD is a large vessel that has no plaque.  LCX is a non-dominant artery that gives rise to one large OM1 branch. There is no plaque.  Coronary Calcium Score:  Left main: 0  Left anterior descending artery: 0  Left circumflex artery: 0  Right coronary  artery: 0  Total: 0  Percentile: 0  Other findings:  Normal pulmonary vein drainage into the left atrium.  Normal left atrial appendage without a thrombus.  Normal size of the pulmonary artery.  IMPRESSION: 1. Coronary calcium score of 0. This was 0 percentile for age and sex matched control.  2. Normal coronary origin with right dominance.  3. No evidence of CAD. CAD-RADS 0. No evidence of CAD (0%). Consider non-atherosclerotic causes of chest pain.   Electronically Signed By: Thomasene Ripple D.O. On: 03/21/2021 12:42  Narrative EXAM: OVER-READ INTERPRETATION  CT CHEST  The following report is an over-read performed by radiologist Dr. Charlett Nose of [facility] on 03/21/2021. This over-read does not include interpretation of cardiac or coronary anatomy or pathology. The coronary CTA interpretation by the cardiologist is attached.  COMPARISON:  None.  FINDINGS: Vascular: Heart is normal size.  Aorta normal caliber.  Mediastinum/Nodes: No adenopathy  Lungs/Pleura: No confluent opacities or effusions.  Upper Abdomen: Imaging into the upper abdomen demonstrates no acute findings.  Musculoskeletal: Chest wall soft tissues are unremarkable. No acute bony abnormality.  IMPRESSION: No acute or significant extracardiac abnormality.  Electronically Signed: By: Charlett Nose M.D. On: 03/21/2021 10:25     ______________________________________________________________________________________________      Risk Assessment/Calculations:  HYPERTENSION CONTROL Vitals:   07/30/23 1326 07/30/23 1353  BP: 110/70 (!) 140/80    The patient's blood pressure is elevated above target today.  In order to address the patient's elevated BP: A new medication was prescribed today.; Follow up with general cardiology has been recommended.          Physical Exam:   VS:  BP (!) 140/80   Pulse (!) 58   Ht 5\' 1"  (1.549 m)   Wt 197 lb (89.4 kg)   SpO2 99%    BMI 37.22 kg/m    Wt Readings from Last 3 Encounters:  07/30/23 197 lb (89.4 kg)  04/22/23 194 lb (88 kg)  01/09/23 191 lb (86.6 kg)    GEN: Well nourished, well developed in no acute distress NECK: No JVD; No carotid bruits CARDIAC: RRR, no murmurs, rubs, gallops RESPIRATORY:  Clear to auscultation without rales, wheezing or rhonchi  ABDOMEN: Soft, non-tender, non-distended EXTREMITIES:  No edema; No deformity   ASSESSMENT AND PLAN: .    HTN - Her average home BP over the last 14 readings is 134/76. BP control has not optimized with lifestyle changes and is ready to make a medication change. Change metoprolol succinate 25 mg to PRN for palpitations and START amlodipine 5 mg. She is hesitant regarding diuretic due to family history of kidney disease. Avoid high salt diet and drink plenty of fluids. Discussed to monitor BP at home at least 2 hours after medications and sitting for 5-10 minutes. MyChart message in 2 weeks for BP results. Increase dose Amlodipine if BP control not optimized.   Palpitations- She has had rare and non bothersome palpitations. She says that they are random and she cannot find a trigger. Use metoprolol succinate 25 mg as needed for palpitations as already has at home.  HLD - Labs 12/28/2022 Total cholesterol: 140, triglycerides: 97, HDL: 48, and LDL: 72. Discussed PREP and she will think about it and message if interested. Recommend aiming for 150 minutes of moderate intensity activity per week and following a heart healthy diet.        Dispo: Follow up in 6-8 weeks.   Signed, Alver Sorrow, NP

## 2023-07-30 NOTE — Patient Instructions (Addendum)
 Medication Instructions:  Your physician has recommended you make the following change in your medication:   START Amlodipine 5 mg CHANGE Metoprolol to only AS NEEDED for palpitations  *If you need a refill on your cardiac medications before your next appointment, please call your pharmacy*  Follow-Up: At [facility], you and your health needs are our priority.  As part of our continuing mission to provide you with exceptional heart care, our providers are all part of one team.  This team includes your primary Cardiologist (physician) and Advanced Practice Providers or APPs (Physician Assistants and Nurse Practitioners) who all work together to provide you with the care you need, when you need it.  Your next appointment:   6-8 week(s)  Provider:   Gillian Shields, NP    We recommend signing up for the patient portal called "MyChart".  Sign up information is provided on this After Visit Summary.  MyChart is used to connect with patients for Virtual Visits (Telemedicine).  Patients are able to view lab/test results, encounter notes, upcoming appointments, etc.  Non-urgent messages can be sent to your provider as well.   To learn more about what you can do with MyChart, go to [email or web address].   Other Instructions We will send a MyChart message in 2 weeks to check on how you're feeling.  Let us know if interested in referral!

## 2023-08-13 ENCOUNTER — Encounter (HOSPITAL_BASED_OUTPATIENT_CLINIC_OR_DEPARTMENT_OTHER): Payer: Self-pay

## 2023-08-14 DIAGNOSIS — F331 Major depressive disorder, recurrent, moderate: Secondary | ICD-10-CM | POA: Diagnosis not present

## 2023-08-14 DIAGNOSIS — F4310 Post-traumatic stress disorder, unspecified: Secondary | ICD-10-CM | POA: Diagnosis not present

## 2023-08-14 DIAGNOSIS — F411 Generalized anxiety disorder: Secondary | ICD-10-CM | POA: Diagnosis not present

## 2023-08-28 ENCOUNTER — Encounter: Payer: Self-pay | Admitting: Physician Assistant

## 2023-08-29 ENCOUNTER — Other Ambulatory Visit: Payer: Self-pay | Admitting: Physician Assistant

## 2023-09-03 ENCOUNTER — Encounter: Payer: Self-pay | Admitting: Physician Assistant

## 2023-09-03 ENCOUNTER — Encounter (HOSPITAL_BASED_OUTPATIENT_CLINIC_OR_DEPARTMENT_OTHER): Payer: Self-pay | Admitting: Cardiovascular Disease

## 2023-09-03 ENCOUNTER — Ambulatory Visit (INDEPENDENT_AMBULATORY_CARE_PROVIDER_SITE_OTHER): Admitting: Physician Assistant

## 2023-09-03 VITALS — BP 136/80 | HR 62 | Temp 98.6°F | Ht 61.0 in | Wt 193.2 lb

## 2023-09-03 DIAGNOSIS — E669 Obesity, unspecified: Secondary | ICD-10-CM | POA: Diagnosis not present

## 2023-09-03 DIAGNOSIS — G4733 Obstructive sleep apnea (adult) (pediatric): Secondary | ICD-10-CM

## 2023-09-03 DIAGNOSIS — E782 Mixed hyperlipidemia: Secondary | ICD-10-CM

## 2023-09-03 DIAGNOSIS — I10 Essential (primary) hypertension: Secondary | ICD-10-CM

## 2023-09-03 MED ORDER — ATORVASTATIN CALCIUM 20 MG PO TABS: 20.0000 mg | ORAL_TABLET | Freq: Every day | ORAL | 3 refills | Status: DC

## 2023-09-03 MED ORDER — TIRZEPATIDE-WEIGHT MANAGEMENT 2.5 MG/0.5ML ~~LOC~~ SOLN: 2.5000 mg | SUBCUTANEOUS | 0 refills | Status: DC

## 2023-09-03 NOTE — Progress Notes (Signed)
 Susan Cordova is a 53 y.o. female here for a new problem.  History of Present Illness:   Chief Complaint  Patient presents with   Obesity    Pt would like to discuss starting Zepbound self pay.    HPI  HTN She is currently taking amlodipine  5 mg daily Her at home BP readings have been around 129/76  She has noticed that her resting pulse has been higher that usual typically around 62. Her typical is around the 40's.  Patient denies chest pain, SOB, blurred vision, dizziness, unusual headaches, lower leg swelling. Patient is compliant with medication. Denies excessive caffeine intake, stimulant usage, excessive alcohol intake, or increase in salt  Cholesterol  Patient reports compliance and good tolerance of Lipitor 20 mg daily.  Sleep Apnea Patient reports compliance and good tolerance of her CPAP machine. She states that she was on a trip for a few days without it and felt her symptoms worsened otherwise no concerns.   Obesity Patient is interested in more information on discussing/starting self-pay Zepbound  We discussed possible side effects and methods of administration of medication.   Past Medical History:  Diagnosis Date   Abnormal menstrual cycle 07/27/2010   Anemia    Anxiety    ASCUS with positive high risk HPV 07/14/2009   Cancer (HCC) 2008   Bladder   Depression    Dysrhythmia    Essential hypertension 03/03/2021   Exertional dyspnea 03/03/2021   Genetic testing 09/27/2022   H/O rubella    H/O varicella    Heart murmur    History of varicose veins    Hyperlipidemia    Low iron    Mitral valve prolapse 05/03/2015   PAC (premature atrial contraction) 05/23/2022   Palpitations 05/03/2015   Sleep apnea      Social History   Tobacco Use   Smoking status: Never   Smokeless tobacco: Never  Vaping Use   Vaping status: Never Used  Substance Use Topics   Alcohol use: Yes    Comment: 4 shots of liquor per year   Drug use: No    Past Surgical History:   Procedure Laterality Date   BLADDER SURGERY  2008   cancer - no chemo, no urostomy   CERVICAL BIOPSY  W/ LOOP ELECTRODE EXCISION     COLPOSCOPY     CYSTOSCOPY     LEEP     LEEP  11/24/2010   Procedure: LOOP ELECTROSURGICAL EXCISION PROCEDURE (LEEP);  Surgeon: Norville Beery, MD;  Location: [facility];  Service: Gynecology;  Laterality: N/A;   tubes in ear   1977   WISDOM TOOTH EXTRACTION  1991    Family History  Problem Relation Age of Onset   Hypertension Mother    Breast cancer Mother 46   Lung cancer Mother 41       died at 49; smoking hx   Alcohol abuse Mother    Cancer Mother    Hyperlipidemia Father    Arrhythmia Father        atrial fibrillation   Lung cancer Father 2   Heart disease Father    Hypertension Father    Stroke Father    Varicose Veins Father    Heart attack Father    Cancer Father    Breast cancer Maternal Grandmother        dx 49s   Cancer Maternal Grandmother    Kidney disease Maternal Grandfather    Early death Maternal Grandfather 49   Dementia Paternal  Grandmother 90   Varicose Veins Paternal Grandmother    Colon cancer Paternal Grandfather 95   Heart disease Paternal Grandfather    Skin cancer Paternal Grandfather 22   Alcohol abuse Paternal Grandfather    Cancer Paternal Grandfather    Basal cell carcinoma Paternal Aunt     No Known Allergies  Current Medications:   Current Outpatient Medications:    amLODipine  (NORVASC ) 5 MG tablet, Take 1 tablet (5 mg total) by mouth daily., Disp: 90 tablet, Rfl: 3   Cholecalciferol (VITAMIN D ) 50 MCG (2000 UT) tablet, Take 1 tablet by mouth daily., Disp: , Rfl:    Ferrous Fumarate (IRON) 18 MG TBCR, Take by mouth. 4-5 x's per month with menstrual cycle, Disp: , Rfl:    FLUoxetine  (PROZAC ) 10 MG capsule, Take 1 capsule (10 mg total) by mouth daily., Disp: 90 capsule, Rfl: 1   hydrOXYzine (VISTARIL) 25 MG capsule, 25 mg as needed., Disp: , Rfl:    meclizine (ANTIVERT) 25 MG tablet, Take 1 tablet by  mouth as needed., Disp: , Rfl:    medroxyPROGESTERone  (PROVERA ) 5 MG tablet, Take one tablet a day for 5 days every 1-2 months if no spontaneous menses., Disp: 15 tablet, Rfl: 3   metoprolol  succinate (TOPROL -XL) 25 MG 24 hr tablet, Take 1 tablet (25 mg total) by mouth as needed (palpitations)., Disp: , Rfl:    tirzepatide (ZEPBOUND) 2.5 MG/0.5ML injection vial, Inject 2.5 mg into the skin once a week., Disp: 2 mL, Rfl: 0   atorvastatin  (LIPITOR) 20 MG tablet, Take 1 tablet (20 mg total) by mouth daily., Disp: 90 tablet, Rfl: 3   Review of Systems:   ROS Negative unless otherwise specified per HPI.  Vitals:   Vitals:   09/03/23 1053  BP: 136/80  Pulse: 62  Temp: 98.6 F (37 C)  TempSrc: Temporal  SpO2: 99%  Weight: 193 lb 4 oz (87.7 kg)  Height: 5\' 1"  (1.549 m)     Body mass index is 36.51 kg/m.  Physical Exam:   Physical Exam Constitutional:      Appearance: Normal appearance. She is well-developed.  HENT:     Head: Normocephalic and atraumatic.  Eyes:     General: Lids are normal.     Extraocular Movements: Extraocular movements intact.     Conjunctiva/sclera: Conjunctivae normal.  Pulmonary:     Effort: Pulmonary effort is normal.  Musculoskeletal:        General: Normal range of motion.     Cervical back: Normal range of motion and neck supple.  Skin:    General: Skin is warm and dry.  Neurological:     Mental Status: She is alert and oriented to person, place, and time.  Psychiatric:        Attention and Perception: Attention and perception normal.        Mood and Affect: Mood normal.        Behavior: Behavior normal.        Thought Content: Thought content normal.        Judgment: Judgment normal.     Assessment and Plan:   Essential hypertension Normotensive Continue amlodipine  5 mg daily as prescribed Follow up in 3-6 month(s), sooner if concerns  Mixed hyperlipidemia Tolerating atorvastatin  20 mg daily Follow up in 3-6 month(s), sooner if  concerns  OSA (obstructive sleep apnea) Compliant with care  Obesity, unspecified class, unspecified obesity type, unspecified whether serious comorbidity present Reviewed risks and benefits/side effect(s) of glp-1 Will send in  self-pay Vials Zepbound 2.5 mg weekly Follow up in 3-6 month(s), sooner if concerns   Alexander Iba, PA-C  I,Safa M Kadhim,acting as a scribe for Alexander Iba, PA.,have documented all relevant documentation on the behalf of Alexander Iba, PA,as directed by  Alexander Iba, PA while in the presence of Alexander Iba, [place].   I, Alexander Iba, [place], have reviewed all documentation for this visit. The documentation on 09/03/23 for the exam, diagnosis, procedures, and orders are all accurate and complete.

## 2023-09-04 ENCOUNTER — Other Ambulatory Visit: Payer: Self-pay | Admitting: Physician Assistant

## 2023-09-04 ENCOUNTER — Encounter: Payer: Self-pay | Admitting: Physician Assistant

## 2023-09-04 MED ORDER — ONDANSETRON HCL 4 MG PO TABS: 4.0000 mg | ORAL_TABLET | Freq: Three times a day (TID) | ORAL | 0 refills | Status: AC | PRN

## 2023-09-12 ENCOUNTER — Encounter: Payer: Self-pay | Admitting: Physician Assistant

## 2023-09-12 DIAGNOSIS — G4733 Obstructive sleep apnea (adult) (pediatric): Secondary | ICD-10-CM

## 2023-09-20 ENCOUNTER — Other Ambulatory Visit: Payer: Self-pay | Admitting: Physician Assistant

## 2023-09-23 ENCOUNTER — Ambulatory Visit (HOSPITAL_BASED_OUTPATIENT_CLINIC_OR_DEPARTMENT_OTHER): Admitting: Family

## 2023-09-23 ENCOUNTER — Encounter (HOSPITAL_BASED_OUTPATIENT_CLINIC_OR_DEPARTMENT_OTHER): Payer: Self-pay

## 2023-09-23 NOTE — Telephone Encounter (Signed)
 Please review- can we extend follow up appointment

## 2023-09-23 NOTE — Telephone Encounter (Signed)
 Left message for patient to call back

## 2023-09-25 ENCOUNTER — Encounter: Payer: Self-pay | Admitting: Physician Assistant

## 2023-09-25 MED ORDER — TIRZEPATIDE-WEIGHT MANAGEMENT 5 MG/0.5ML ~~LOC~~ SOLN: 5.0000 mg | SUBCUTANEOUS | 0 refills | Status: DC

## 2023-10-11 ENCOUNTER — Other Ambulatory Visit: Payer: Self-pay | Admitting: Physician Assistant

## 2023-10-16 ENCOUNTER — Ambulatory Visit: Admitting: Neurology

## 2023-10-16 ENCOUNTER — Encounter: Payer: Self-pay | Admitting: Neurology

## 2023-10-16 VITALS — BP 142/73 | HR 66 | Ht 61.0 in | Wt 180.0 lb

## 2023-10-16 DIAGNOSIS — R6889 Other general symptoms and signs: Secondary | ICD-10-CM | POA: Diagnosis not present

## 2023-10-16 DIAGNOSIS — R351 Nocturia: Secondary | ICD-10-CM

## 2023-10-16 DIAGNOSIS — G4733 Obstructive sleep apnea (adult) (pediatric): Secondary | ICD-10-CM

## 2023-10-16 DIAGNOSIS — E66811 Obesity, class 1: Secondary | ICD-10-CM | POA: Diagnosis not present

## 2023-10-16 NOTE — Progress Notes (Signed)
 Subjective:    Patient ID: Susan Cordova is a 53 y.o. female.  HPI    True Mar, MD, PhD [facility] [address], [address] [address] [place], [place] [address]  Dear Lucie,   I saw your patient, Susan Cordova, upon your kind request in my neurologic clinic today for evaluation of her sleep disorder, in particular, evaluation of her prior Dx of OSA. The patient is unaccompanied today. As you know, the patient is a 53 year old female with an underlying medical history of anemia, bladder cancer, anxiety, depression, palpitations, mitral valve prolapse, PACs, and obesity, who was previously diagnosed with obstructive sleep apnea and placed on PAP therapy.  She has been on CPAP of 9 cm since May 2019.  She is compliant with treatment.  I evaluated her for sleep apnea concern back in 2019.  She had a baseline sleep study through our sleep lab on 06/26/2017 and she had a subsequent CPAP titration study on 08/01/2017.  I reviewed her compliance data from her current machine from 09/15/2023 through 10/14/2023, during which time she used her machine every night, average usage of 9 hours and 48 minutes, residual AHI at goal at 0.9/h, leak on the low side with a 95th percentile at 0.2 L/min.  Her Epworth sleepiness score is 2 out of 24, fatigue severity score is 32 out of 63.  She has had weight fluctuation.  She is working on weight loss.  She has continued to benefit from CPAP therapy.  She increased the set pressure on her own from 9 to 9.2 cm.  She has seen an error message on the machine that the motor life has been exceeded.  She would like to get a new machine.  She uses nasal pillows with good success.  She is typically up-to-date with her supplies.  She has had this machine for over 6 years.  Bedtime is around 9 and rise time between 6:30 AM and 7.  She has retired.  She was a professor.  She lives alone with her dog.  She drinks caffeine in the form of tea, 1 or 2 cups/day.  She  has nocturia about once per average night, denies recurrent morning headaches.  She is a non-smoker and drinks alcohol rarely.  Previously (copied from previous notes for reference):   11/13/18: 53 year old right-handed woman with an underlying medical history of bladder cancer, UTI, mitral prolapse, palpitations, anxiety, depression, anemia and mild obesity, who presents for a virtual, video based appointment via MyChart video visit for follow-up consultation and yearly checkup of her obstructive sleep apnea, on treatment with CPAP.  The patient is unaccompanied today and joins via computer from home, I am located in my office.  I last saw her on 11/12/2017, at which time we talked about her sleep study results.  She was fully compliant with CPAP and reported feeling better including more consolidated sleep, better daytime tiredness.  I reviewed her CPAP compliance data from 10/13/2018 through 11/11/2018 which is a total of 30 days, during which time she used her CPAP every night with percent used days greater than 4 hours at 100%, indicating superb compliance with an average usage of 9 hours and 29 minutes, residual AHI at goal at 0.7/h, leak low with a 95th percentile at 0 L/min on a pressure of 9 cm with EPR of 3.        I first met her on 05/27/2017 at the request of her primary care physician, at which time she  reported snoring and difficulty with sleep maintenance. I suggested we proceed with a sleep study. She had a baseline sleep study, followed by a CPAP titration study. Her baseline sleep study from 06/26/2017 showed a sleep efficiency of only 47% with a prolonged sleep latency and increased wake after sleep onset with moderate to severe sleep fragmentation noted. She had a decreased percentage of REM sleep. She did not achieve any supine sleep. Total AHI was 8.6 per hour, REM AHI 51.4 per hour. Average oxygen saturation 98%, nadir was 87%. She had no significant PLMS. She was advised to pursue a  full night CPAP titration study, which she had on 08/01/2017. Sleep efficiency was improved at 64.2%, sleep latency prolonged at 64.5 minutes, wake after sleep onset 87 minutes. She achieved an increased percentage of slow-wave sleep and REM sleep was mildly reduced at 17% or near normal. She had good results with CPAP of 9 cm. She had no significant PLMS. She indicated that she had slept a little better with CPAP therapy at night. Based on her test results I prescribed CPAP therapy for home use at a pressure of 9 cm.   I reviewed her CPAP compliance data from 10/12/2017 through 11/10/2017, which is a total of 30 days, during which time she used her CPAP every night with percent used days greater than 4 hours at 100%, indicating superb compliance with an average usage of 9 hours and 33 minutes, residual AHI at goal at 0.3 per hour, leak very low, pressure at 9 cm with EPR of 3.    05/27/2017: (She) reports snoring and difficulty maintaining sleep. I reviewed your office note from 04/22/2017. Her Epworth sleepiness score is 2 out of 24, fatigue score is 43 out of 63. 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 She is a nonsmoker and drinks alcohol infrequently, a few times a year on average, caffeine in the form of tea, 1-2 cups per day on average, avoiding caffeine after 4 PM. She denies AM HAs, has had nightly nocturia, about once or twice per average night, sometimes even 2 or 3 times per night. She sometimes wakes up from her own snoring. She has no family history of OSA. She denies symptoms of restless leg syndrome. She tries to keep a good bedtime routine. She does not take any medication to help her sleep. Going to sleep and not that difficult for her staying asleep is. Bedtime is generally between 9 and 9:30 during the school year, little later in the summer time, right time around 7, maybe 7:30 in the summertime. She has no children and no pets in the household.  She does not have a TV in the bedroom.   Her Past Medical History Is Significant For: Past Medical History:  Diagnosis Date   Abnormal menstrual cycle 07/27/2010   Anemia    Anxiety    ASCUS with positive high risk HPV 07/14/2009   Cancer (HCC) 2008   Bladder   Depression    Dysrhythmia    Essential hypertension 03/03/2021   Exertional dyspnea 03/03/2021   Genetic testing 09/27/2022   H/O rubella    H/O varicella    Heart murmur    History of varicose veins    Hyperlipidemia    Low iron    Mitral valve prolapse 05/03/2015   PAC (premature atrial contraction) 05/23/2022   Palpitations 05/03/2015   Sleep apnea     Her Past Surgical History Is Significant For: Past  Surgical History:  Procedure Laterality Date   BLADDER SURGERY  2008   cancer - no chemo, no urostomy   CERVICAL BIOPSY  W/ LOOP ELECTRODE EXCISION     COLPOSCOPY     CYSTOSCOPY     LEEP     LEEP  11/24/2010   Procedure: LOOP ELECTROSURGICAL EXCISION PROCEDURE (LEEP);  Surgeon: Ovid DELENA All, MD;  Location: [facility];  Service: Gynecology;  Laterality: N/A;   tubes in ear   1977   WISDOM TOOTH EXTRACTION  1991    Her Family History Is Significant For: Family History  Problem Relation Age of Onset   Hypertension Mother    Breast cancer Mother 55   Lung cancer Mother 52       died at 79; smoking hx   Alcohol abuse Mother    Cancer Mother    Hyperlipidemia Father    Arrhythmia Father        atrial fibrillation   Lung cancer Father 30   Heart disease Father    Hypertension Father    Stroke Father    Varicose Veins Father    Heart attack Father    Cancer Father    Basal cell carcinoma Paternal Aunt    Breast cancer Maternal Grandmother        dx 81s   Cancer Maternal Grandmother    Kidney disease Maternal Grandfather    Early death Maternal Grandfather 30   Dementia Paternal Grandmother 47   Varicose Veins Paternal Grandmother    Colon cancer Paternal Grandfather 66   Heart disease Paternal  Grandfather    Skin cancer Paternal Grandfather 57   Alcohol abuse Paternal Grandfather    Cancer Paternal Grandfather    Sleep apnea Neg Hx     Her Social History Is Significant For: Social History   Socioeconomic History   Marital status: Single    Spouse name: Not on file   Number of children: Not on file   Years of education: Not on file   Highest education level: Doctorate  Occupational History   Occupation: professor    Employer: [employer]  Tobacco Use   Smoking status: Never   Smokeless tobacco: Never  Vaping Use   Vaping status: Never Used  Substance and Sexual Activity   Alcohol use: Yes    Comment: 4 shots of liquor per year   Drug use: No   Sexual activity: Not Currently    Partners: Male    Birth control/protection: Abstinence, Other-see comments  Other Topics Concern   Not on file  Social History Narrative   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    Lives alone   Social Drivers of Health   Financial Resource Strain: Low Risk  (09/02/2023)   Overall Financial Resource Strain (CARDIA)    Difficulty of Paying Living Expenses: Not hard at all  Food Insecurity: No Food Insecurity (09/02/2023)   Hunger Vital Sign    Worried About Running Out of Food in the Last Year: Never true    Ran Out of Food in the Last Year: Never true  Transportation Needs: No Transportation Needs (09/02/2023)   PRAPARE - Administrator, Civil Service (Medical): No    Lack of Transportation (Non-Medical): No  Physical Activity: Sufficiently Active (09/02/2023)   Exercise Vital Sign    Days of Exercise per Week: 6 days    Minutes of Exercise per Session: 60 min  Stress: No Stress Concern Present (09/02/2023)   [employer]  of Occupational Health - Occupational Stress Questionnaire    Feeling of Stress : Only a little  Social Connections: Socially Isolated (09/02/2023)   Social Connection and Isolation Panel    Frequency of Communication with Friends  and Family: Once a week    Frequency of Social Gatherings with Friends and Family: Once a week    Attends Religious Services: Never    Database administrator or Organizations: No    Attends Engineer, structural: Not on file    Marital Status: Never married    Her Allergies Are:  No Known Allergies:   Her Current Medications Are:  Outpatient Encounter Medications as of 10/16/2023  Medication Sig   amLODipine  (NORVASC ) 5 MG tablet Take 1 tablet (5 mg total) by mouth daily.   atorvastatin  (LIPITOR) 20 MG tablet Take 1 tablet (20 mg total) by mouth daily.   Cholecalciferol (VITAMIN D ) 50 MCG (2000 UT) tablet Take 1 tablet by mouth daily.   Ferrous Fumarate (IRON) 18 MG TBCR Take by mouth. 4-5 x's per month with menstrual cycle   FLUoxetine  (PROZAC ) 10 MG capsule Take 1 capsule (10 mg total) by mouth daily.   hydrOXYzine (VISTARIL) 25 MG capsule 25 mg as needed.   meclizine (ANTIVERT) 25 MG tablet Take 1 tablet by mouth as needed.   medroxyPROGESTERone  (PROVERA ) 5 MG tablet Take one tablet a day for 5 days every 1-2 months if no spontaneous menses.   metoprolol  succinate (TOPROL -XL) 25 MG 24 hr tablet Take 1 tablet (25 mg total) by mouth as needed (palpitations).   ondansetron  (ZOFRAN ) 4 MG tablet Take 1 tablet (4 mg total) by mouth every 8 (eight) hours as needed for nausea or vomiting.   ZEPBOUND  5 MG/0.5ML injection vial INJECT 0.5 ML (5 MG) UNDER THE SKIN ONCE WEEKLY (0.5ML= 50 UNITS)   No facility-administered encounter medications on file as of 10/16/2023.  :   Review of Systems:  Out of a complete 14 point review of systems, all are reviewed and negative with the exception of these symptoms as listed below:          Review of Systems  Neurological:        Pt here for sleep consult   Pt here for new cpap machine Pt wears cpap  4 hours nightly  Pt states last sleep study 2019 Pt denies headaches,little fatigue.   ESS:2  FSS 32    Objective:  Neurological  Exam  Physical Exam Physical Examination:   Vitals:   10/16/23 1401  BP: (!) 142/73  Pulse: 66    General Examination: The patient is a very pleasant 53 y.o. female in no acute distress. She appears well-developed and well-nourished and well groomed.   HEENT: Normocephalic, atraumatic, pupils are equal, round and reactive to light and accommodation. She has corrective eyeglasses. Hearing is grossly intact. Face is symmetric with normal facial animation, tongue protrudes centrally and palate elevates symmetrically. Speech is clear, no hypophonia, no lip, neck or jaw tremor. Neck is supple. She has full range of motion. Airway examination reveals no significant mouth dryness, good dental hygiene, small airway entry with tonsils about 1+ bilaterally.  Tongue protrudes centrally and palate elevates symmetrically, neck circumference 14-1/2 inches.  Minimal overbite.     Chest: Clear to auscultation without wheezing, rhonchi or crackles noted.   Heart: S1+S2+0, regular and normal with in mild systolic heart murmur noted.    Abdomen: Soft, non-tender and non-distended with normal bowel sounds appreciated on auscultation.  Extremities: There is no pitting edema in the distal lower extremities bilaterally.   Skin: Warm and dry without trophic changes noted.   Musculoskeletal: exam reveals no obvious joint deformities, tenderness or joint swelling or erythema.    Neurologically:  Mental status: The patient is awake, alert and oriented in all 4 spheres. Her immediate and remote memory, attention, language skills and fund of knowledge are appropriate. There is no evidence of aphasia, agnosia, apraxia or anomia. Speech is clear with normal prosody and enunciation. Thought process is linear. Mood is normal and affect is normal.  Cranial nerves II - XII are as described above under HEENT exam. Motor exam: Normal bulk, strength and tone is noted. There is no obvious resting or action tremor.  Fine  motor skills and coordination: grossly intact.  Cerebellar testing: No dysmetria or intention tremor. There is no truncal or gait ataxia.  Sensory exam: intact to light touch.  Gait, station and balance: She stands easily. No veering to one side is noted. No leaning to one side is noted. Posture is age-appropriate and stance is narrow based. Gait shows normal stride length and normal pace. No problems turning are noted.   Assessment and plan:  In summary, Otisha Spickler is a very pleasant 53 y.o.-year old female 53 year old female with an underlying medical history of anemia, bladder cancer, anxiety, depression, palpitations, mitral valve prolapse, PACs, and obesity, who presents for evaluation of her obstructive sleep apnea.  She has been on CPAP therapy for over 6 years with good success but does report some weight fluctuation.  She is currently working on weight loss and we mutually agreed to proceed with reevaluation with a home sleep test, also to qualify her for new equipment.  She is commended for treatment adherence with her current CPAP of 9.2 cm.  She has continued to benefit from treatment.  She is advised that she should not use her CPAP the night of home sleep testing so we get diagnostic data rather than treatment data.  She is advised that we will plan a follow-up after testing.  I answered all her questions today and she was in agreement with our plan.   Thank you very much for allowing me to participate in the care of this nice patient. If I can be of any further assistance to you please do not hesitate to call me at [phone].  Sincerely,   True Mar, MD, PhD

## 2023-10-22 ENCOUNTER — Other Ambulatory Visit: Payer: Self-pay | Admitting: *Deleted

## 2023-10-22 MED ORDER — ZEPBOUND 5 MG/0.5ML ~~LOC~~ SOLN: 5.0000 mg | SUBCUTANEOUS | 0 refills | Status: DC

## 2023-10-24 ENCOUNTER — Ambulatory Visit (INDEPENDENT_AMBULATORY_CARE_PROVIDER_SITE_OTHER): Admitting: Neurology

## 2023-10-24 DIAGNOSIS — R6889 Other general symptoms and signs: Secondary | ICD-10-CM

## 2023-10-24 DIAGNOSIS — G4733 Obstructive sleep apnea (adult) (pediatric): Secondary | ICD-10-CM | POA: Diagnosis not present

## 2023-10-24 DIAGNOSIS — E66811 Obesity, class 1: Secondary | ICD-10-CM

## 2023-10-24 DIAGNOSIS — R351 Nocturia: Secondary | ICD-10-CM

## 2023-10-30 NOTE — Progress Notes (Signed)
 See procedure note.

## 2023-11-01 ENCOUNTER — Ambulatory Visit: Payer: Self-pay | Admitting: Neurology

## 2023-11-01 DIAGNOSIS — G4733 Obstructive sleep apnea (adult) (pediatric): Secondary | ICD-10-CM

## 2023-11-01 NOTE — Procedures (Signed)
 [facility]  HOME SLEEP TEST (SANSA) REPORT (Mail-Out Device):   STUDY DATE: 10/25/2023  DOB: 02-22-1971  MRN: [id number]  ORDERING CLINICIAN: True Mar, MD, PhD   REFERRING CLINICIAN: Job Lukes, [place]   CLINICAL INFORMATION/HISTORY: 53 year old female with an underlying medical history of anemia, bladder cancer, anxiety, depression, palpitations, mitral valve prolapse, PACs, and obesity, who was previously diagnosed with obstructive sleep apnea and placed on PAP therapy.  She has been on CPAP of 9 cm since May 2019.  She is compliant with treatment and has benefited from it.  She should be eligible for a new machine.    PATIENT'S LAST REPORTED EPWORTH SLEEPINESS SCORE (ESS): 2/24.  BMI (at the time of sleep clinic visit and/or test date): 34 kg/m  FINDINGS:   Study Protocol:    The SANSA single-point-of-skin-contact chest-worn sensor - an FDA cleared and DOT approved type 4 home sleep test device - measures eight physiological channels,  including blood oxygen saturation (measured via PPG [photoplethysmography]), EKG-derived heart rate, respiratory effort, chest movement (measured via accelerometer), snoring, body position, and actigraphy. The device is designed to be worn for up to 10 hours per study.   Sleep Summary:   Total Recording Time (hours, min): 8 hours, 40 min  Total Effective Sleep Time (hours, min):  7 hours, 31 min  Sleep Efficiency (%):    87%   Respiratory Indices:   Calculated sAHI (per hour):  6.9/hour         Oxygen Saturation Statistics:    Oxygen Saturation (%) Mean: 94.7%   Minimum oxygen saturation (%):                 83.4%   O2 Saturation Range (%): 83.4-98.5%   Time below or at 88% saturation: 0 min   Pulse Rate Statistics:   Pulse Mean (bpm):    60/min    Pulse Range (47 - 105/min)   Snoring: mild to moderate  IMPRESSION/DIAGNOSES:   OSA (obstructive sleep apnea), mild    RECOMMENDATIONS:   This  home sleep test demonstrates overall mild obstructive sleep apnea with a total AHI of 6.9/hour and O2 nadir of 83.4%. Snoring was detected, in the mild to moderate range.  This patient has benefited from home CPAP therapy, she is compliant with her treatment and current treatment setting of set pressure of 9 cm has provided good apnea control and is tolerated by the patient.  She should be eligible for a new machine which I will prescribe.  A full night, in-lab PAP titration study may aid in improving proper treatment settings and with mask fit, if needed, down the road. Alternative treatments may include weight loss (where appropriate) along with avoidance of the supine sleep position (if possible), or an oral appliance in appropriate candidates.   Please note that untreated obstructive sleep apnea may carry additional perioperative morbidity. Patients with significant obstructive sleep apnea should receive perioperative PAP therapy and the surgeons and particularly the anesthesiologist should be informed of the diagnosis and the severity of the sleep disordered breathing. The patient should be cautioned not to drive, work at heights, or operate dangerous or heavy equipment when tired or sleepy. Review and reiteration of good sleep hygiene measures should be pursued with any patient. Other causes of the patient's symptoms, including circadian rhythm disturbances, an underlying mood disorder, medication effect and/or an underlying medical problem cannot be ruled out based on this test. Clinical correlation is recommended.  The patient and her referring provider  will be notified of the test results. The patient will be seen in follow up in sleep clinic at [facility], as necessary.  I certify that I have reviewed the raw data recording prior to the issuance of this report in accordance with the standards of the American Academy of Sleep Medicine (AASM).    INTERPRETING PHYSICIAN:   True Mar, MD, PhD Medical  Director, [facility]) Diplomat, ABPN (Neurology and Sleep)   [facility] [address] [place], [place] [address] [phone]

## 2023-11-04 ENCOUNTER — Encounter: Payer: Self-pay | Admitting: Neurology

## 2023-11-05 NOTE — Telephone Encounter (Signed)
-----   Message from True Mar sent at 11/01/2023  1:53 PM EDT ----- Patient had a home sleep test on 10/25/2023 for reevaluation of her OSA.  I saw her on 10/16/2023.  Please advise patient that her home sleep test showed evidence of mild sleep apnea.  I recommend  continuing with home CPAP therapy, I will write for new machine as she should be eligible.  Please go over compliance expectations with her and provide her with a follow-up appointment within 2 to 3  months after set up on the new machine. ----- Message ----- From: Mar True, MD Sent: 11/01/2023   1:51 PM EDT To: True Mar, MD

## 2023-11-05 NOTE — Telephone Encounter (Signed)
 Relayed results to pt.  She is ok to continue.  I told her will message [employer] and they will start authorization then call her,  let us  know if dont hear from them by 7 days.  She will call when gets new machine so we can make 2-3 month appt.  She verbalized understanding.  Appreciated call back.

## 2023-11-07 NOTE — Telephone Encounter (Signed)
 RE: new autopap machine Received: Yesterday New, Adine Neysa Nena GORMAN, RN; Joylene Carlean Sheree Leveda Viktoria Dortha Jackson Avelina; 1 other Received, thank you!     Previous Messages    ----- Message ----- From: Neysa Nena GORMAN, RN Sent: 11/05/2023   1:51 PM EDT To: Adine Joylene; Avelina Jackson; Ephraim Viktoria* Subject: new autopap machine                            New order in Epic  Audryana Hockenberry Female, 53 y.o., 1970/06/30 MRN: [id number] Phone: [phone] (M)   Thanks  [place]

## 2023-11-08 ENCOUNTER — Other Ambulatory Visit: Payer: Self-pay | Admitting: Physician Assistant

## 2023-11-13 ENCOUNTER — Encounter: Payer: Self-pay | Admitting: Neurology

## 2023-11-15 ENCOUNTER — Other Ambulatory Visit: Payer: Self-pay | Admitting: Physician Assistant

## 2023-11-19 DIAGNOSIS — G4733 Obstructive sleep apnea (adult) (pediatric): Secondary | ICD-10-CM | POA: Diagnosis not present

## 2023-11-20 ENCOUNTER — Encounter: Payer: Self-pay | Admitting: Neurology

## 2023-12-06 ENCOUNTER — Other Ambulatory Visit: Payer: Self-pay | Admitting: Physician Assistant

## 2023-12-20 DIAGNOSIS — G4733 Obstructive sleep apnea (adult) (pediatric): Secondary | ICD-10-CM | POA: Diagnosis not present

## 2023-12-31 ENCOUNTER — Ambulatory Visit (INDEPENDENT_AMBULATORY_CARE_PROVIDER_SITE_OTHER): Payer: BC Managed Care – PPO | Admitting: Physician Assistant

## 2023-12-31 ENCOUNTER — Encounter: Payer: Self-pay | Admitting: Physician Assistant

## 2023-12-31 VITALS — BP 124/80 | HR 62 | Temp 98.2°F | Ht 61.5 in | Wt 163.5 lb

## 2023-12-31 DIAGNOSIS — I10 Essential (primary) hypertension: Secondary | ICD-10-CM

## 2023-12-31 DIAGNOSIS — E782 Mixed hyperlipidemia: Secondary | ICD-10-CM | POA: Diagnosis not present

## 2023-12-31 DIAGNOSIS — I491 Atrial premature depolarization: Secondary | ICD-10-CM | POA: Insufficient documentation

## 2023-12-31 DIAGNOSIS — E669 Obesity, unspecified: Secondary | ICD-10-CM | POA: Diagnosis not present

## 2023-12-31 DIAGNOSIS — G4733 Obstructive sleep apnea (adult) (pediatric): Secondary | ICD-10-CM | POA: Diagnosis not present

## 2023-12-31 DIAGNOSIS — Z0000 Encounter for general adult medical examination without abnormal findings: Secondary | ICD-10-CM

## 2023-12-31 LAB — COMPREHENSIVE METABOLIC PANEL WITH GFR
ALT: 9 U/L (ref 0–35)
AST: 10 U/L (ref 0–37)
Albumin: 4.2 g/dL (ref 3.5–5.2)
Alkaline Phosphatase: 48 U/L (ref 39–117)
BUN: 9 mg/dL (ref 6–23)
CO2: 27 meq/L (ref 19–32)
Calcium: 9.2 mg/dL (ref 8.4–10.5)
Chloride: 104 meq/L (ref 96–112)
Creatinine, Ser: 0.72 mg/dL (ref 0.40–1.20)
GFR: 95.15 mL/min (ref >60.00)
Glucose, Bld: 89 mg/dL (ref 70–99)
Potassium: 4.2 meq/L (ref 3.5–5.1)
Sodium: 136 meq/L (ref 135–145)
Total Bilirubin: 0.6 mg/dL (ref 0.2–1.2)
Total Protein: 6.9 g/dL (ref 6.0–8.3)

## 2023-12-31 LAB — CBC WITH DIFFERENTIAL/PLATELET
Basophils Absolute: 0 K/uL (ref 0.0–0.1)
Basophils Relative: 0.4 % (ref 0.0–3.0)
Eosinophils Absolute: 0.1 K/uL (ref 0.0–0.7)
Eosinophils Relative: 0.9 % (ref 0.0–5.0)
HCT: 35 % — ABNORMAL LOW (ref 36.0–46.0)
Hemoglobin: 11.7 g/dL — ABNORMAL LOW (ref 12.0–15.0)
Lymphocytes Relative: 32.9 % (ref 12.0–46.0)
Lymphs Abs: 1.9 K/uL (ref 0.7–4.0)
MCHC: 33.4 g/dL (ref 30.0–36.0)
MCV: 84 fl (ref 78.0–100.0)
Monocytes Absolute: 0.5 K/uL (ref 0.1–1.0)
Monocytes Relative: 7.8 % (ref 3.0–12.0)
Neutro Abs: 3.4 K/uL (ref 1.4–7.7)
Neutrophils Relative %: 58 % (ref 43.0–77.0)
Platelets: 220 K/uL (ref 150.0–400.0)
RBC: 4.16 Mil/uL (ref 3.87–5.11)
RDW: 13.9 % (ref 11.5–15.5)
WBC: 5.8 K/uL (ref 4.0–10.5)

## 2023-12-31 LAB — LIPID PANEL
Cholesterol: 117 mg/dL (ref 0–200)
HDL: 40.8 mg/dL (ref >39.00)
LDL Cholesterol: 63 mg/dL (ref 0–99)
NonHDL: 76.23
Total CHOL/HDL Ratio: 3
Triglycerides: 66 mg/dL (ref 0.0–149.0)
VLDL: 13.2 mg/dL (ref 0.0–40.0)

## 2023-12-31 NOTE — Progress Notes (Signed)
 Subjective:    Susan Cordova is a 53 y.o. female and is here for a comprehensive physical exam.  HPI  There are no preventive care reminders to display for this patient.  Discussed the use of AI scribe software for clinical note transcription with the patient, who gave verbal consent to proceed.  History of Present Illness Susan Cordova is a 53 year old female who presents for follow-up regarding weight loss and medication management.  She experiences nausea with her current medication of Zepbound  5 mg weekly, especially during dose increases, but has not had vomiting. She is on a 5 mg dose, losing between half a pound to two pounds per week, and is satisfied with this rate. She is cautious about further dose increases due to previous side effects.  She discontinued amlodipine  after a decrease in blood pressure and continues to monitor it regularly. She has not experienced palpitations since stopping metoprolol  and continues to take her statin.  She follows a pescatarian diet, is mindful of protein intake, and has abstained from alcohol since starting her medication. She reports changes in bowel movements but does not find them problematic.  She uses a CPAP machine daily for mild sleep apnea, as shown in a home sleep study. She is interested in monitoring her cholesterol levels due to a family history and is currently taking a statin. She also takes vitamin D  and Prozac , with plans to potentially stop the latter soon.   Health Maintenance: Immunizations -- UpToDate; planning to get a flu shot Colonoscopy -- UpToDate  Mammogram -- UpToDate  PAP -- utd Bone Density -- n/a Diet -- eating all varieties of foods; well-balanced Exercise -- regular exercise  Sleep habits -- using CPAP  Mood -- stable  UTD with dentist? - yes UTD with eye doctor? - yes  Weight history: Wt Readings from Last 10 Encounters:  12/31/23 163 lb 8 oz (74.2 kg)  10/16/23 180 lb (81.6 kg)  09/03/23 193 lb 4  oz (87.7 kg)  07/30/23 197 lb (89.4 kg)  04/22/23 194 lb (88 kg)  01/09/23 191 lb (86.6 kg)  12/28/22 188 lb (85.3 kg)  06/22/22 184 lb (83.5 kg)  05/23/22 183 lb 4.8 oz (83.1 kg)  05/08/22 180 lb (81.6 kg)   Body mass index is 30.39 kg/m. Patient's last menstrual period was 12/20/2023 (exact date).  Alcohol use:  reports current alcohol use.  Tobacco use:  Tobacco Use: Low Risk  (12/31/2023)   Patient History    Smoking Tobacco Use: Never    Smokeless Tobacco Use: Never    Passive Exposure: Not on file   Eligible for lung cancer screening? no     12/28/2022   10:24 AM  Depression screen PHQ 2/9  Decreased Interest 1  Down, Depressed, Hopeless 1  PHQ - 2 Score 2  Altered sleeping 3  Tired, decreased energy 2  Change in appetite 2  Feeling bad or failure about yourself  1  Trouble concentrating 1  Moving slowly or fidgety/restless 0  Suicidal thoughts 0  PHQ-9 Score 11  Difficult doing work/chores Somewhat difficult     Other providers/specialists: Patient Care Team: Job Lukes, [place] as PCP - General (Physician Assistant) Raford Riggs, MD as PCP - Cardiology (Cardiology) Rozell Norris, MD as Referring Physician (Urology) Harvey Seltzer, MD as Consulting Physician (Sports Medicine) [place], Amy, MD as Consulting Physician (Dermatology) Jannis Kate Norris, MD as Consulting Physician (Obstetrics and Gynecology) Court Pulling, MD as Referring Physician (Dermatology)    PMHx,  SurgHx, SocialHx, Medications, and Allergies were reviewed in the Visit Navigator and updated as appropriate.   Past Medical History:  Diagnosis Date   Abnormal menstrual cycle 07/27/2010   Anemia    Anxiety    Arrhythmia 04/23/2016   ASCUS with positive high risk HPV 07/14/2009   Cancer (HCC) 2008   Bladder   Depression    Dysrhythmia    Essential hypertension 03/03/2021   Exertional dyspnea 03/03/2021   Genetic testing 09/27/2022   H/O rubella    H/O varicella     Heart murmur    History of varicose veins    Hyperlipidemia    Low iron    Mitral valve prolapse 05/03/2015   PAC (premature atrial contraction) 05/23/2022   Palpitations 05/03/2015   Sleep apnea      Past Surgical History:  Procedure Laterality Date   BLADDER SURGERY  2008   cancer - no chemo, no urostomy   CERVICAL BIOPSY  W/ LOOP ELECTRODE EXCISION     COLPOSCOPY     CYSTOSCOPY     LEEP     LEEP  11/24/2010   Procedure: LOOP ELECTROSURGICAL EXCISION PROCEDURE (LEEP);  Surgeon: Ovid DELENA All, MD;  Location: [facility];  Service: Gynecology;  Laterality: N/A;   tubes in ear   1977   WISDOM TOOTH EXTRACTION  1991     Family History  Problem Relation Age of Onset   Hypertension Mother    Breast cancer Mother 1   Lung cancer Mother 73       died at 62; smoking hx   Alcohol abuse Mother    Cancer Mother    Hyperlipidemia Father    Arrhythmia Father        atrial fibrillation   Lung cancer Father 8   Heart disease Father    Hypertension Father    Stroke Father    Varicose Veins Father    Heart attack Father    Cancer Father    Basal cell carcinoma Paternal Aunt    Breast cancer Maternal Grandmother        dx 8s   Cancer Maternal Grandmother    Kidney disease Maternal Grandfather    Early death Maternal Grandfather 30   Dementia Paternal Grandmother 37   Varicose Veins Paternal Grandmother    Colon cancer Paternal Grandfather 72   Heart disease Paternal Grandfather    Skin cancer Paternal Grandfather 33   Alcohol abuse Paternal Grandfather    Cancer Paternal Grandfather    Sleep apnea Neg Hx     Social History   Tobacco Use   Smoking status: Never   Smokeless tobacco: Never  Vaping Use   Vaping status: Never Used  Substance Use Topics   Alcohol use: Yes    Comment: 4 shots of liquor per year   Drug use: No    Review of Systems:   Review of Systems  Constitutional:  Negative for chills, fever, malaise/fatigue and weight loss.  HENT:  Negative  for hearing loss, sinus pain and sore throat.   Respiratory:  Negative for cough and hemoptysis.   Cardiovascular:  Negative for chest pain, palpitations, leg swelling and PND.  Gastrointestinal:  Negative for abdominal pain, constipation, diarrhea, heartburn, nausea and vomiting.  Genitourinary:  Negative for dysuria, frequency and urgency.  Musculoskeletal:  Negative for back pain, myalgias and neck pain.  Skin:  Negative for itching and rash.  Neurological:  Negative for dizziness, tingling, seizures and headaches.  Endo/Heme/Allergies:  Negative for  polydipsia.  Psychiatric/Behavioral:  Negative for depression. The patient is not nervous/anxious.     Objective:   BP 124/80 (BP Location: Left Arm, Patient Position: Sitting, Cuff Size: Normal)   Pulse 62   Temp 98.2 F (36.8 C) (Temporal)   Ht 5' 1.5 (1.562 m)   Wt 163 lb 8 oz (74.2 kg)   LMP 12/20/2023 (Exact Date)   SpO2 99%   BMI 30.39 kg/m  Body mass index is 30.39 kg/m.   General Appearance:    Alert, cooperative, no distress, appears stated age  Head:    Normocephalic, without obvious abnormality, atraumatic  Eyes:    PERRL, conjunctiva/corneas clear, EOM's intact, fundi    benign, both eyes  Ears:    Normal TM's and external ear canals, both ears  Nose:   Nares normal, septum midline, mucosa normal, no drainage    or sinus tenderness  Throat:   Lips, mucosa, and tongue normal; teeth and gums normal  Neck:   Supple, symmetrical, trachea midline, no adenopathy;    thyroid:  no enlargement/tenderness/nodules; no carotid   bruit or JVD  Back:     Symmetric, no curvature, ROM normal, no CVA tenderness  Lungs:     Clear to auscultation bilaterally, respirations unlabored  Chest Wall:    No tenderness or deformity   Heart:    Regular rate and rhythm, S1 and S2 normal, no murmur, rub or gallop  Breast Exam:    Deferred  Abdomen:     Soft, non-tender, bowel sounds active all four quadrants,    no masses, no organomegaly   Genitalia:    Deferred  Extremities:   Extremities normal, atraumatic, no cyanosis or edema  Pulses:   2+ and symmetric all extremities  Skin:   Skin color, texture, turgor normal, no rashes or lesions  Lymph nodes:   Cervical, supraclavicular, and axillary nodes normal  Neurologic:   CNII-XII intact, normal strength, sensation and reflexes    throughout    Assessment/Plan:   Assessment and Plan Assessment & Plan General Health Maintenance Up to date on colonoscopy and mammogram. Plans for flu shot in October and COVID shot when available. Dental and eye exams current or near current. - Administer flu shot in October. - Administer COVID shot when available. - Reports that her gynecologist performs her yearly urinalysis for history of bladder cancer  Obesity Significant weight loss with tirzepatide  5 mg. Nausea with dose escalation but increased energy and activity. Prefers current dose due to side effects. Oral option anticipated in December may influence future treatment. - Continue tirzepatide  5 mg/0.5 mL injection once a week. - Encourage continued strength training and physical activity.  Essential hypertension Blood pressure improved with weight loss and tirzepatide . Discontinued amlodipine  due to side effects after starting tirzepatide  and experiencing lower blood pressure. No recent palpitations, metoprolol  not needed. - Discontinue amlodipine . - Continue metoprolol  as needed for palpitations.  Mixed hyperlipidemia Cholesterol well-controlled on atorvastatin  20 mg. Calcium  score zero. Discussed potential atorvastatin  dosage reduction if LDL is significantly low. - Order lipid panel to assess current cholesterol levels. - Consider reducing atorvastatin  dosage if LDL is significantly low.  Obstructive sleep apnea Continues daily CPAP use with compliance. Recent study confirmed mild apnea. Weight loss has not significantly changed apnea events. Discussed potential retesting  if significant weight loss continues. - Continue CPAP therapy. - Follow up with neurologist for CPAP compliance verification.        Lucie Buttner, PA-C [facility]

## 2024-01-01 ENCOUNTER — Ambulatory Visit: Payer: Self-pay | Admitting: Physician Assistant

## 2024-01-13 ENCOUNTER — Other Ambulatory Visit (HOSPITAL_COMMUNITY): Payer: Self-pay

## 2024-01-13 MED ORDER — COVID-19 MRNA VAC-TRIS(PFIZER) 30 MCG/0.3ML IM SUSY
0.3000 mL | PREFILLED_SYRINGE | Freq: Once | INTRAMUSCULAR | 0 refills | Status: AC
  Filled 2024-01-13: qty 0.3, 1d supply, fill #0

## 2024-01-13 MED ORDER — CAPVAXIVE 0.5 ML IM SOSY
0.5000 mL | PREFILLED_SYRINGE | Freq: Once | INTRAMUSCULAR | 0 refills | Status: AC
  Filled 2024-01-13: qty 0.5, 1d supply, fill #0

## 2024-01-15 ENCOUNTER — Encounter: Payer: Self-pay | Admitting: Physician Assistant

## 2024-01-15 MED ORDER — ZEPBOUND 5 MG/0.5ML ~~LOC~~ SOLN: 5.0000 mg | SUBCUTANEOUS | 0 refills | Status: DC

## 2024-01-23 ENCOUNTER — Encounter: Payer: Self-pay | Admitting: Adult Health

## 2024-01-23 ENCOUNTER — Ambulatory Visit: Admitting: Adult Health

## 2024-01-23 VITALS — BP 111/70 | HR 69 | Ht 61.0 in | Wt 160.0 lb

## 2024-01-23 DIAGNOSIS — G4733 Obstructive sleep apnea (adult) (pediatric): Secondary | ICD-10-CM | POA: Diagnosis not present

## 2024-01-23 NOTE — Progress Notes (Signed)
 [facility] [address] [place]. [place] [address] [phone]       OFFICE FOLLOW UP NOTE  [name] Date of Birth:  Feb 19, 1971 Medical Record Number:  [id number]    Primary neurologist: Dr. Buck Reason for visit: Initial CPAP follow-up    SUBJECTIVE:   CHIEF COMPLAINT:  Chief Complaint  Patient presents with   Obstructive Sleep Apnea    Rm 8 alone Pt is well and stable. Reports no concerns with OSA/CPAP    Follow-up visit:  Prior visit: 10/16/2023 with Dr. Buck  Brief HPI:   Susan Cordova is a 53 y.o. female followed for OSA on CPAP.  Initially diagnosed in 2019 and placed on CPAP therapy.  Repeat HST 10/2023 showed overall mild OSA with total AHI of 6.9/h and set up with new CPap machine 10/2023.    Interval history:  Patient returns for initial CPAP compliance visit.  Reports overall doing well with CPAP therapy and with her new machine.  Reports continued benefit in regards to sleep quality and daytime energy levels.  ESS 2/24.  Routinely follows with [employer].  No questions or concerns at this time.       ROS:   14 system review of systems performed and negative with exception of those listed in HPI  PMH:  Past Medical History:  Diagnosis Date   Abnormal menstrual cycle 07/27/2010   Anemia    Anxiety    Arrhythmia 04/23/2016   ASCUS with positive high risk HPV 07/14/2009   Cancer (HCC) 2008   Bladder   Depression    Dysrhythmia    Essential hypertension 03/03/2021   Exertional dyspnea 03/03/2021   Genetic testing 09/27/2022   H/O rubella    H/O varicella    Heart murmur    History of varicose veins    Hyperlipidemia    Low iron    Mitral valve prolapse 05/03/2015   PAC (premature atrial contraction) 05/23/2022   Palpitations 05/03/2015   Sleep apnea     PSH:  Past Surgical History:  Procedure Laterality Date   BLADDER SURGERY  2008   cancer - no chemo, no urostomy   CERVICAL BIOPSY  W/ LOOP ELECTRODE  EXCISION     COLPOSCOPY     CYSTOSCOPY     LEEP     LEEP  11/24/2010   Procedure: LOOP ELECTROSURGICAL EXCISION PROCEDURE (LEEP);  Surgeon: Ovid DELENA All, MD;  Location: [facility];  Service: Gynecology;  Laterality: N/A;   tubes in ear   1977   WISDOM TOOTH EXTRACTION  1991    Social History:  Social History   Socioeconomic History   Marital status: Single    Spouse name: Not on file   Number of children: Not on file   Years of education: Not on file   Highest education level: Doctorate  Occupational History   Occupation: professor    Employer: [employer]  Tobacco Use   Smoking status: Never   Smokeless tobacco: Never  Vaping Use   Vaping status: Never Used  Substance and Sexual Activity   Alcohol use: Yes    Comment: 4 shots of liquor per year   Drug use: No   Sexual activity: Not Currently    Partners: Male    Birth control/protection: Abstinence, Other-see comments  Other Topics Concern   Not on file  Social History Narrative   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    Lives alone   Social Drivers of Health  Financial Resource Strain: Low Risk  (09/02/2023)   Overall Financial Resource Strain (CARDIA)    Difficulty of Paying Living Expenses: Not hard at all  Food Insecurity: No Food Insecurity (09/02/2023)   Hunger Vital Sign    Worried About Running Out of Food in the Last Year: Never true    Ran Out of Food in the Last Year: Never true  Transportation Needs: No Transportation Needs (09/02/2023)   PRAPARE - Administrator, Civil Service (Medical): No    Lack of Transportation (Non-Medical): No  Physical Activity: Sufficiently Active (09/02/2023)   Exercise Vital Sign    Days of Exercise per Week: 6 days    Minutes of Exercise per Session: 60 min  Stress: No Stress Concern Present (09/02/2023)   [employer] of Occupational Health - Occupational Stress Questionnaire    Feeling of Stress : Only a little  Social Connections:  Socially Isolated (09/02/2023)   Social Connection and Isolation Panel    Frequency of Communication with Friends and Family: Once a week    Frequency of Social Gatherings with Friends and Family: Once a week    Attends Religious Services: Never    Database administrator or Organizations: No    Attends Engineer, structural: Not on file    Marital Status: Never married  Catering manager Violence: Not on file    Family History:  Family History  Problem Relation Age of Onset   Hypertension Mother    Breast cancer Mother 61   Lung cancer Mother 9       died at 77; smoking hx   Alcohol abuse Mother    Cancer Mother    Hyperlipidemia Father    Arrhythmia Father        atrial fibrillation   Lung cancer Father 42   Heart disease Father    Hypertension Father    Stroke Father    Varicose Veins Father    Heart attack Father    Cancer Father    Basal cell carcinoma Paternal Aunt    Breast cancer Maternal Grandmother        dx 9s   Cancer Maternal Grandmother    Kidney disease Maternal Grandfather    Early death Maternal Grandfather 30   Dementia Paternal Grandmother 3   Varicose Veins Paternal Grandmother    Colon cancer Paternal Grandfather 69   Heart disease Paternal Grandfather    Skin cancer Paternal Grandfather 101   Alcohol abuse Paternal Grandfather    Cancer Paternal Grandfather    Sleep apnea Neg Hx     Medications:   Current Outpatient Medications on File Prior to Visit  Medication Sig Dispense Refill   atorvastatin  (LIPITOR) 20 MG tablet Take 1 tablet (20 mg total) by mouth daily. 90 tablet 3   Cholecalciferol (VITAMIN D ) 50 MCG (2000 UT) tablet Take 1 tablet by mouth daily.     Ferrous Fumarate (IRON) 18 MG TBCR Take by mouth. 4-5 x's per month with menstrual cycle     FLUoxetine  (PROZAC ) 10 MG capsule TAKE 1 CAPSULE DAILY 90 capsule 1   hydrOXYzine (VISTARIL) 25 MG capsule 25 mg as needed.     meclizine (ANTIVERT) 25 MG tablet Take 1 tablet by mouth  as needed.     medroxyPROGESTERone  (PROVERA ) 5 MG tablet Take one tablet a day for 5 days every 1-2 months if no spontaneous menses. 15 tablet 3   metoprolol  succinate (TOPROL -XL) 25 MG 24 hr tablet Take 1  tablet (25 mg total) by mouth as needed (palpitations).     ondansetron  (ZOFRAN ) 4 MG tablet Take 1 tablet (4 mg total) by mouth every 8 (eight) hours as needed for nausea or vomiting. 20 tablet 0   tirzepatide  (ZEPBOUND ) 5 MG/0.5ML injection vial Inject 5 mg into the skin once a week. 2 mL 0   No current facility-administered medications on file prior to visit.    Allergies:  No Known Allergies    OBJECTIVE:  Physical Exam  Vitals:   01/23/24 1257  BP: 111/70  Pulse: 69  Weight: 160 lb (72.6 kg)  Height: 5' 1 (1.549 m)   Body mass index is 30.23 kg/m. No results found.   General: well developed, well nourished, seated, in no evident distress Head: head normocephalic and atraumatic.   Neck: supple with no carotid or supraclavicular bruits Cardiovascular: regular rate and rhythm, no murmurs  Neurologic Exam Mental Status: Awake and fully alert. Oriented to place and time. Recent and remote memory intact. Attention span, concentration and fund of knowledge appropriate. Mood and affect appropriate.  Cranial Nerves: Pupils equal, briskly reactive to light. Extraocular movements full without nystagmus. Visual fields full to confrontation. Hearing intact. Facial sensation intact. Face, tongue, palate moves normally and symmetrically.  Motor: Normal bulk and tone. Normal strength in all tested extremity muscles Gait and Station: Arises from chair without difficulty. Stance is normal. Gait demonstrates normal stride length and balance without use of AD.         ASSESSMENT/PLAN: Susan Cordova is a 53 y.o. year old female    OSA on CPAP :  Compliance report shows satisfactory usage with optimal residual AHI.   Continue current pressure setting of 9 with EPR 3 Discussed  continued nightly usage with ensuring greater than 4 hours nightly for optimal benefit and per insurance purposes.   Continue to follow with DME company [employer] for any needed supplies or CPAP related concerns CPAP set up 10/2023     Follow up in 1 year via MyChart video visit or call earlier if needed   CC:  PCP: Job Lukes, PA    I personally spent a total of 15 minutes in the care of the patient today including preparing to see the patient, performing a medically appropriate exam/evaluation, counseling and educating, placing orders, and documenting clinical information in the EHR.     Harlene Bogaert, AGNP-BC  [facility] [address] [place], [place] [address]  Phone [phone] Fax [phone] Note: This document was prepared with digital dictation and possible smart phrase technology. Any transcriptional errors that result from this process are unintentional.

## 2024-01-23 NOTE — Progress Notes (Signed)
 Quenten Nawaz D, CMA  Joylene Bradley; Garcia, Patricia; Ziegler, Melissa; Tucker, Dolanda; Cain, [place] New orders have been placed for the above pt, DOB:2070-11-05 Thanks

## 2024-02-05 ENCOUNTER — Other Ambulatory Visit: Payer: Self-pay | Admitting: Physician Assistant

## 2024-02-06 NOTE — Telephone Encounter (Signed)
 Noted

## 2024-02-07 NOTE — Telephone Encounter (Signed)
 Noted

## 2024-02-10 ENCOUNTER — Ambulatory Visit (HOSPITAL_BASED_OUTPATIENT_CLINIC_OR_DEPARTMENT_OTHER): Admitting: Cardiovascular Disease

## 2024-02-11 ENCOUNTER — Encounter (HOSPITAL_BASED_OUTPATIENT_CLINIC_OR_DEPARTMENT_OTHER): Payer: Self-pay | Admitting: Cardiovascular Disease

## 2024-02-11 ENCOUNTER — Ambulatory Visit (HOSPITAL_BASED_OUTPATIENT_CLINIC_OR_DEPARTMENT_OTHER): Admitting: Cardiovascular Disease

## 2024-02-11 VITALS — BP 126/64 | HR 61 | Ht 61.5 in | Wt 160.6 lb

## 2024-02-11 DIAGNOSIS — I491 Atrial premature depolarization: Secondary | ICD-10-CM

## 2024-02-11 DIAGNOSIS — E669 Obesity, unspecified: Secondary | ICD-10-CM | POA: Diagnosis not present

## 2024-02-11 DIAGNOSIS — G4733 Obstructive sleep apnea (adult) (pediatric): Secondary | ICD-10-CM | POA: Diagnosis not present

## 2024-02-11 DIAGNOSIS — E782 Mixed hyperlipidemia: Secondary | ICD-10-CM

## 2024-02-11 DIAGNOSIS — R002 Palpitations: Secondary | ICD-10-CM

## 2024-02-11 DIAGNOSIS — I10 Essential (primary) hypertension: Secondary | ICD-10-CM | POA: Diagnosis not present

## 2024-02-11 MED ORDER — ZEPBOUND 5 MG/0.5ML ~~LOC~~ SOLN: 5.0000 mg | SUBCUTANEOUS | 0 refills | Status: DC

## 2024-02-11 NOTE — Patient Instructions (Signed)
 Medication Instructions:  ?Your physician recommends that you continue on your current medications as directed. Please refer to the Current Medication list given to you today.  ? ?Labwork: ?NONE ? ?Testing/Procedures: ?NONE ? ?Follow-Up: ?AS NEEDED  ? ?  ?

## 2024-02-11 NOTE — Progress Notes (Signed)
 Cardiology Office Note:  .   Date:  02/11/2024  ID:  Susan Cordova, DOB 07-19-1970, MRN [id number] PCP: Job Lukes, PA  [facility] Providers Cardiologist:  Annabella Scarce, MD    History of Present Illness: .    Susan Cordova is a 53 y.o. female  with HTN, hyperlipidemia and palpitations who is here for follow up. She was first seen in 2017 for an evaluation of palpitations.  Ms. Raiche noted palpitations for the preceding 2 years.  Episodes occurred once per day or once every other day and lasted for 1-2 seconds at a time.  She wore an ambulatory monitor that revealed some PACs and PVCs.  She had an echo with LVEF 55 to 60% and no mitral valve prolapse.  PFO could not be excluded.  She was diagnosed with hypertension and was started on metoprolol  due to both hypertension and palpitations.   On 03/03/21, she reported that she occasionally checked her BP at home and her BP had been as high as 150-170s. She reported lower readings in the AM. She was continued on Metoprolol  25 MG. She had a lot of palpitations and tightness in her chest at the time. Her heart rate that day was 61 on metoprolol  25 mg. Therefore, the dose could not be titrated, but pt could take extra half tablet prn. EKG showed sinus rhythm, Rate 61 bpm. She had noted that she is more short of breath with exertion. A coronary CTA was ordered to evaluate for CAD, which resulted a coronary calcium  score of 0 and no evidence of CAD. She had labs checked 11/2020 and her hemoglobin, electrolytes, and TSH were all within normal limits.  She had an echo at [facility] 04/2020 with LVEF 60% and trace mitral regurgitation.   She followed-up with Vannie Reche RAMAN, NP on 04/07/2021 after cardiac CTA. She reported a brief episode of chest pain at rest the day before, which lasted a few seconds. Her palpitations were well controlled on her Metoprolol  dose. 11/2020 LDL 62. Atorvastatin  20mg  QD was continued.  At her visit 04/2022 blood  pressure was elevated in the office but controlled at home.  She was under stress after her father's recent passing.  She followed up with Reche Vannie, NP 06/2022 was doing well.  At her visit 12/2022 blood pressures were elevated in the office and slightly above goal at home.  She wanted to focus on lifestyle modification.  She follows with Reche Vannie, NP/2025 and her palpitations had improved.  Average home blood pressure was 134/76.  Metoprolol  was changed to as needed and she was started on amlodipine .  Discussed the use of AI scribe software for clinical note transcription with the patient, who gave verbal consent to proceed.  History of Present Illness Susan Cordova is a 53 year old female with hypertension and hyperlipidemia who presents for a cardiovascular follow-up.  She stopped taking amlodipine  and started Zepbound  in May, which improved her blood pressure readings. However, she experienced very low readings and felt 'crummy,' leading her to stop the medication. Her current blood pressure readings are stable, mostly in the 110s to 120s over 67, with slightly higher readings in the afternoon. She is not currently taking metoprolol  as she experiences minimal palpitations, about once a month.  She is actively engaging in physical activity, walking at least once a day, usually twice, and doing strength training three times a week. She feels good during these activities.  Her cholesterol levels have improved significantly with statin  use, and she reports no side effects. She has a genetic predisposition to high cholesterol but no symptoms or side effects from her current treatment.  She mentions a strong family history of cardiovascular issues. Her LDL is currently at 63. She is paying out of pocket for her medication and finds it beneficial, particularly in reducing 'food noise,' which helps her maintain a healthy lifestyle.  ROS:  As per HPI  Studies Reviewed: .     Calcium  score  02/2021: IMPRESSION: 1. Coronary calcium  score of 0. This was 0 percentile for age and sex matched control.   2. Normal coronary origin with right dominance.   3. No evidence of CAD. CAD-RADS 0. No evidence of CAD (0%). Consider non-atherosclerotic causes of chest pain.    Risk Assessment/Calculations:             Physical Exam:   VS:  BP 126/64 (BP Location: Right Arm, Patient Position: Sitting, Cuff Size: Normal)   Pulse 61   Ht 5' 1.5 (1.562 m)   Wt 160 lb 9.6 oz (72.8 kg)   SpO2 99%   BMI 29.85 kg/m  , BMI Body mass index is 29.85 kg/m. GENERAL:  Well appearing HEENT: Pupils equal round and reactive, fundi not visualized, oral mucosa unremarkable NECK:  No jugular venous distention, waveform within normal limits, carotid upstroke brisk and symmetric, no bruits, no thyromegaly LUNGS:  Clear to auscultation bilaterally HEART:  RRR.  PMI not displaced or sustained,S1 and S2 within normal limits, no S3, no S4, no clicks, no rubs, I/VI systolic murmur at the apex ABD:  Flat, positive bowel sounds normal in frequency in pitch, no bruits, no rebound, no guarding, no midline pulsatile mass, no hepatomegaly, no splenomegaly EXT:  2 plus pulses throughout, no edema, no cyanosis no clubbing SKIN:  No rashes no nodules NEURO:  Cranial nerves II through XII grossly intact, motor grossly intact throughout PSYCH:  Cognitively intact, oriented to person place and time   ASSESSMENT AND PLAN: .    Assessment & Plan # Palpitations and atrial premature depolarizations Palpitations minimal and infrequent. Metoprolol  not taken due to infrequency.  # Mild MVP: Very faint benign murmur detected. No action required.  Trivial MR on prior echo.  - Monitor for symptom changes.  # Primary hypertension Blood pressure well-controlled with current regimen. Stopped amlodipine , maintaining control with lifestyle modifications. - Continue blood pressure monitoring. - Maintain exercise  regimen.  # Mixed hyperlipidemia Cholesterol well-controlled with statin. LDL at 63, below target. Discussed cholesterol reduction benefits and statin dose reduction option. No side effects reported. - Continue statin therapy. - Monitor cholesterol levels.  # Obesity Actively managing weight with lifestyle changes and Zepbound . Weight loss improved cardiovascular risk profile. - Continue exercise regimen. - Continue Zepbound  therapy.           Dispo: f/u prn  Signed, Annabella Scarce, MD

## 2024-02-11 NOTE — Addendum Note (Signed)
 Addended by: THURMON ARLAND PARAS on: 02/11/2024 03:14 PM   Modules accepted: Orders

## 2024-02-21 ENCOUNTER — Encounter (HOSPITAL_BASED_OUTPATIENT_CLINIC_OR_DEPARTMENT_OTHER): Payer: Self-pay | Admitting: Cardiovascular Disease

## 2024-02-21 MED ORDER — ATORVASTATIN CALCIUM 20 MG PO TABS: 20.0000 mg | ORAL_TABLET | Freq: Every day | ORAL | 3 refills | Status: DC

## 2024-02-26 DIAGNOSIS — D225 Melanocytic nevi of trunk: Secondary | ICD-10-CM | POA: Diagnosis not present

## 2024-02-26 DIAGNOSIS — L814 Other melanin hyperpigmentation: Secondary | ICD-10-CM | POA: Diagnosis not present

## 2024-02-26 DIAGNOSIS — D2261 Melanocytic nevi of right upper limb, including shoulder: Secondary | ICD-10-CM | POA: Diagnosis not present

## 2024-02-26 DIAGNOSIS — D2272 Melanocytic nevi of left lower limb, including hip: Secondary | ICD-10-CM | POA: Diagnosis not present

## 2024-02-26 DIAGNOSIS — D2239 Melanocytic nevi of other parts of face: Secondary | ICD-10-CM | POA: Diagnosis not present

## 2024-02-26 DIAGNOSIS — L821 Other seborrheic keratosis: Secondary | ICD-10-CM | POA: Diagnosis not present

## 2024-02-26 DIAGNOSIS — B360 Pityriasis versicolor: Secondary | ICD-10-CM | POA: Diagnosis not present

## 2024-02-26 DIAGNOSIS — D1801 Hemangioma of skin and subcutaneous tissue: Secondary | ICD-10-CM | POA: Diagnosis not present

## 2024-02-26 DIAGNOSIS — D2262 Melanocytic nevi of left upper limb, including shoulder: Secondary | ICD-10-CM | POA: Diagnosis not present

## 2024-02-26 DIAGNOSIS — D2271 Melanocytic nevi of right lower limb, including hip: Secondary | ICD-10-CM | POA: Diagnosis not present

## 2024-02-26 DIAGNOSIS — L7211 Pilar cyst: Secondary | ICD-10-CM | POA: Diagnosis not present

## 2024-02-26 DIAGNOSIS — L720 Epidermal cyst: Secondary | ICD-10-CM | POA: Diagnosis not present

## 2024-03-04 ENCOUNTER — Other Ambulatory Visit: Payer: Self-pay | Admitting: Physician Assistant

## 2024-03-12 DIAGNOSIS — F411 Generalized anxiety disorder: Secondary | ICD-10-CM | POA: Diagnosis not present

## 2024-03-12 DIAGNOSIS — F4310 Post-traumatic stress disorder, unspecified: Secondary | ICD-10-CM | POA: Diagnosis not present

## 2024-03-20 ENCOUNTER — Other Ambulatory Visit: Payer: Self-pay | Admitting: Physician Assistant

## 2024-03-21 DIAGNOSIS — G4733 Obstructive sleep apnea (adult) (pediatric): Secondary | ICD-10-CM | POA: Diagnosis not present

## 2024-04-09 DIAGNOSIS — F331 Major depressive disorder, recurrent, moderate: Secondary | ICD-10-CM | POA: Diagnosis not present

## 2024-04-09 DIAGNOSIS — F4310 Post-traumatic stress disorder, unspecified: Secondary | ICD-10-CM | POA: Diagnosis not present

## 2024-04-09 DIAGNOSIS — F411 Generalized anxiety disorder: Secondary | ICD-10-CM | POA: Diagnosis not present

## 2024-04-20 DIAGNOSIS — G4733 Obstructive sleep apnea (adult) (pediatric): Secondary | ICD-10-CM | POA: Diagnosis not present

## 2024-04-27 ENCOUNTER — Other Ambulatory Visit: Payer: Self-pay

## 2024-04-28 ENCOUNTER — Other Ambulatory Visit: Payer: Self-pay | Admitting: Physician Assistant

## 2024-04-28 DIAGNOSIS — Z1231 Encounter for screening mammogram for malignant neoplasm of breast: Secondary | ICD-10-CM

## 2024-04-28 NOTE — Progress Notes (Deleted)
 "  54 y.o. G0P0000 Single Caucasian female here for annual exam.    PCP: Job Lukes, PA   No LMP recorded.           Sexually active: No.  The current method of family planning is abstinence.    Menopausal hormone therapy:  n/a Exercising: {yes no:314532}  {types:19826} Smoker:  no  OB History  Gravida Para Term Preterm AB Living  0 0 0 0 0 0  SAB IAB Ectopic Multiple Live Births  0 0 0 0 0     HEALTH MAINTENANCE: Last 2 paps:  04/09/22 neg, HR HPV neg, 02/05/17 neg, HPV neg  History of abnormal Pap or positive HPV:  no Mammogram:   05/21/23 Breast Density Cat B, BIRADS Cat 1 neg  Colonoscopy:  07/14/21 Bone Density:  n/a  Result  n/a   Immunization History  Administered Date(s) Administered   Fluzone Influenza virus vaccine,trivalent (IIV3), split virus 01/21/2012   Hep A, Unspecified 08/29/2012   Hepatitis A 08/29/2012   Influenza Inj Mdck Quad Pf 02/04/2019   Influenza,inj,Quad PF,6+ Mos 01/08/2014, 01/24/2018, 01/05/2020   Influenza-Unspecified 02/22/2016, 01/21/2017, 01/22/2019, 02/01/2022   PFIZER(Purple Top)SARS-COV-2 Vaccination 06/25/2019, 07/23/2019, 01/29/2020, 08/18/2020, 02/01/2021   Pfizer(Comirnaty )Fall Seasonal Vaccine 12 years and older 02/09/2022, 01/13/2024   Pneumococcal Conjugate Pcv21, Polysaccharide Crm197 Conjugaf 01/13/2024   Td (Adult),5 Lf Tetanus Toxid, Preservative Free 09/24/1994   Tdap 09/12/2012, 12/28/2022   Typhoid Inactivated 08/29/2012   Zoster Recombinant(Shingrix) 03/21/2020, 06/03/2020      reports that she has never smoked. She has been exposed to tobacco smoke. She has never used smokeless tobacco. She reports current alcohol use. She reports that she does not use drugs.  Past Medical History:  Diagnosis Date   Abnormal menstrual cycle 07/27/2010   Anemia    Anxiety    Arrhythmia 04/23/2016   ASCUS with positive high risk HPV 07/14/2009   Cancer (HCC) 2008   Bladder   Depression    Dysrhythmia    Essential  hypertension 03/03/2021   Exertional dyspnea 03/03/2021   Genetic testing 09/27/2022   H/O rubella    H/O varicella    Heart murmur    History of varicose veins    Hyperlipidemia    Low iron    Mitral valve prolapse 05/03/2015   PAC (premature atrial contraction) 05/23/2022   Palpitations 05/03/2015   Sleep apnea     Past Surgical History:  Procedure Laterality Date   BLADDER SURGERY  2008   cancer - no chemo, no urostomy   CERVICAL BIOPSY  W/ LOOP ELECTRODE EXCISION     COLPOSCOPY     CYSTOSCOPY     LEEP     LEEP  11/24/2010   Procedure: LOOP ELECTROSURGICAL EXCISION PROCEDURE (LEEP);  Surgeon: Ovid DELENA All, MD;  Location: [facility];  Service: Gynecology;  Laterality: N/A;   tubes in ear   1977   WISDOM TOOTH EXTRACTION  1991    Current Outpatient Medications  Medication Sig Dispense Refill   atorvastatin  (LIPITOR) 20 MG tablet Take 1 tablet (20 mg total) by mouth daily. 90 tablet 3   Cholecalciferol (VITAMIN D ) 50 MCG (2000 UT) tablet Take 1 tablet by mouth daily.     Ferrous Fumarate (IRON) 18 MG TBCR Take by mouth. 4-5 x's per month with menstrual cycle     FLUoxetine  (PROZAC ) 10 MG capsule TAKE 1 CAPSULE DAILY 90 capsule 1   hydrOXYzine (VISTARIL) 25 MG capsule 25 mg as needed.  meclizine (ANTIVERT) 25 MG tablet Take 1 tablet by mouth as needed.     medroxyPROGESTERone  (PROVERA ) 5 MG tablet Take one tablet a day for 5 days every 1-2 months if no spontaneous menses. 15 tablet 3   metoprolol  succinate (TOPROL -XL) 25 MG 24 hr tablet Take 1 tablet (25 mg total) by mouth as needed (palpitations).     ondansetron  (ZOFRAN ) 4 MG tablet Take 1 tablet (4 mg total) by mouth every 8 (eight) hours as needed for nausea or vomiting. 20 tablet 0   tirzepatide  (ZEPBOUND ) 5 MG/0.5ML injection vial INJECT 0.5 ML (5 MG) UNDER THE SKIN ONCE WEEKLY (0.5ML= 50 UNITS) 2 mL 2   No current facility-administered medications for this visit.    ALLERGIES: Patient has no known  allergies.  Family History  Problem Relation Age of Onset   Hypertension Mother    Breast cancer Mother 19   Lung cancer Mother 61       died at 10; smoking hx   Alcohol abuse Mother    Cancer Mother    Hyperlipidemia Father    Arrhythmia Father        atrial fibrillation   Lung cancer Father 99   Heart disease Father    Hypertension Father    Stroke Father    Varicose Veins Father    Heart attack Father    Cancer Father    Basal cell carcinoma Paternal Aunt    Breast cancer Maternal Grandmother        dx 28s   Cancer Maternal Grandmother    Kidney disease Maternal Grandfather    Early death Maternal Grandfather 30   Dementia Paternal Grandmother 69   Varicose Veins Paternal Grandmother    Colon cancer Paternal Grandfather 69   Heart disease Paternal Grandfather    Skin cancer Paternal Grandfather 41   Alcohol abuse Paternal Grandfather    Cancer Paternal Grandfather    Sleep apnea Neg Hx     Review of Systems  PHYSICAL EXAM:  There were no vitals taken for this visit.    General appearance: alert, cooperative and appears stated age Head: normocephalic, without obvious abnormality, atraumatic Neck: no adenopathy, supple, symmetrical, trachea midline and thyroid normal to inspection and palpation Lungs: clear to auscultation bilaterally Breasts: normal appearance, no masses or tenderness, No nipple retraction or dimpling, No nipple discharge or bleeding, No axillary adenopathy Heart: regular rate and rhythm Abdomen: soft, non-tender; no masses, no organomegaly Extremities: extremities normal, atraumatic, no cyanosis or edema Skin: skin color, texture, turgor normal. No rashes or lesions Lymph nodes: cervical, supraclavicular, and axillary nodes normal. Neurologic: grossly normal  Pelvic: External genitalia:  no lesions              No abnormal inguinal nodes palpated.              Urethra:  normal appearing urethra with no masses, tenderness or lesions               Bartholins and Skenes: normal                 Vagina: normal appearing vagina with normal color and discharge, no lesions              Cervix: no lesions              Pap taken: {yes no:314532} Bimanual Exam:  Uterus:  normal size, contour, position, consistency, mobility, non-tender  Adnexa: no mass, fullness, tenderness              Rectal exam: {yes no:314532}.  Confirms.              Anus:  normal sphincter tone, no lesions  Chaperone was present for exam:  {BSCHAPERONE:31226::Emily F, CMA}  ASSESSMENT: Well woman visit with gynecologic exam.  PHQ-2-9: ***  ***  PLAN: Mammogram screening discussed. Self breast awareness reviewed. Pap and HRV collected:  {yes no:314532} Guidelines for Calcium , Vitamin D , regular exercise program including cardiovascular and weight bearing exercise. Medication refills:  *** {LABS (Optional):23779} Follow up:  ***    Additional counseling given.  {yes [id number]. ***  total time was spent for this patient encounter, including preparation, face-to-face counseling with the patient, coordination of care, and documentation of the encounter in addition to doing the well woman visit with gynecologic exam.        "

## 2024-04-29 ENCOUNTER — Ambulatory Visit: Payer: BC Managed Care – PPO | Admitting: Obstetrics and Gynecology

## 2024-04-29 MED ORDER — ATORVASTATIN CALCIUM 20 MG PO TABS: 20.0000 mg | ORAL_TABLET | Freq: Every day | ORAL | 3 refills | Status: AC

## 2024-04-29 NOTE — Progress Notes (Signed)
 "  54 y.o. G0P0000 Single Caucasian female here for annual exam.    Lumps in inner labia.   Lost almost 45 pounds on Zepbound .  Off antiHTN.  Apnea improving.   Periods are regular.  Occasional spotting a couple of days leading up to her menses.  Occasional night sweat.  Retired now. Writing.     PCP: Job Lukes, PA   Patient's last menstrual period was 04/11/2024.     Period Cycle (Days): 28 Period Duration (Days): 5 Period Pattern: Regular Menstrual Flow: Moderate Menstrual Control: Other (Comment) (Cup) Dysmenorrhea: None     Sexually active: No.  The current method of family planning is abstinence.    Menopausal hormone therapy:  n/a Exercising: Yes.    Walking and strength training  Smoker:  no  OB History  Gravida Para Term Preterm AB Living  0 0 0 0 0 0  SAB IAB Ectopic Multiple Live Births  0 0 0 0 0     HEALTH MAINTENANCE: Last 2 paps:  04/09/22 neg, HR HPV neg, 02/05/17 neg, HPV neg  History of abnormal Pap or positive HPV:  yes, LEEP 2012. Mammogram:   05/21/23 Breast Density Cat B, BIRADS Cat 1 neg.  Has appointment. Colonoscopy:  07/14/21 normal - due in 2033 Bone Density:  n/a  Result  n/a   Immunization History  Administered Date(s) Administered   Fluzone Influenza virus vaccine,trivalent (IIV3), split virus 01/21/2012   Hep A, Unspecified 08/29/2012   Hepatitis A 08/29/2012   Influenza Inj Mdck Quad Pf 02/04/2019   Influenza,inj,Quad PF,6+ Mos 01/08/2014, 01/24/2018, 01/05/2020   Influenza-Unspecified 02/22/2016, 01/21/2017, 01/22/2019, 02/01/2022   PFIZER(Purple Top)SARS-COV-2 Vaccination 06/25/2019, 07/23/2019, 01/29/2020, 08/18/2020, 02/01/2021   Pfizer(Comirnaty )Fall Seasonal Vaccine 12 years and older 02/09/2022, 01/13/2024   Pneumococcal Conjugate Pcv21, Polysaccharide Crm197 Conjugaf 01/13/2024   Td (Adult),5 Lf Tetanus Toxid, Preservative Free 09/24/1994   Tdap 09/12/2012, 12/28/2022   Typhoid Inactivated 08/29/2012   Zoster  Recombinant(Shingrix) 03/21/2020, 06/03/2020      reports that she has never smoked. She has been exposed to tobacco smoke. She has never used smokeless tobacco. She reports current alcohol use. She reports that she does not use drugs.  Past Medical History:  Diagnosis Date   Abnormal menstrual cycle 07/27/2010   Anemia    Anxiety    Arrhythmia 04/23/2016   ASCUS with positive high risk HPV 07/14/2009   Cancer (HCC) 2008   Bladder   Depression    Dysrhythmia    Essential hypertension 03/03/2021   Exertional dyspnea 03/03/2021   Genetic testing 09/27/2022   H/O rubella    H/O varicella    Heart murmur    History of varicose veins    Hyperlipidemia    Low iron    Mitral valve prolapse 05/03/2015   PAC (premature atrial contraction) 05/23/2022   Palpitations 05/03/2015   Sleep apnea     Past Surgical History:  Procedure Laterality Date   BLADDER SURGERY  2008   cancer - no chemo, no urostomy   CERVICAL BIOPSY  W/ LOOP ELECTRODE EXCISION     COLPOSCOPY     CYSTOSCOPY     LEEP     LEEP  11/24/2010   Procedure: LOOP ELECTROSURGICAL EXCISION PROCEDURE (LEEP);  Surgeon: Ovid DELENA All, MD;  Location: [facility];  Service: Gynecology;  Laterality: N/A;   tubes in ear   1977   WISDOM TOOTH EXTRACTION  1991    Current Outpatient Medications  Medication Sig Dispense Refill   atorvastatin  (  LIPITOR) 20 MG tablet Take 1 tablet (20 mg total) by mouth daily. 90 tablet 3   Cholecalciferol (VITAMIN D ) 50 MCG (2000 UT) tablet Take 1 tablet by mouth daily.     Ferrous Fumarate (IRON) 18 MG TBCR Take by mouth. 4-5 x's per month with menstrual cycle     FLUoxetine  (PROZAC ) 10 MG capsule TAKE 1 CAPSULE DAILY 90 capsule 1   hydrOXYzine (VISTARIL) 25 MG capsule 25 mg as needed.     meclizine (ANTIVERT) 25 MG tablet Take 1 tablet by mouth as needed.     metoprolol  succinate (TOPROL -XL) 25 MG 24 hr tablet Take 1 tablet (25 mg total) by mouth as needed (palpitations).     ondansetron  (ZOFRAN ) 4  MG tablet Take 1 tablet (4 mg total) by mouth every 8 (eight) hours as needed for nausea or vomiting. 20 tablet 0   tirzepatide  (ZEPBOUND ) 5 MG/0.5ML injection vial INJECT 0.5 ML (5 MG) UNDER THE SKIN ONCE WEEKLY (0.5ML= 50 UNITS) 2 mL 2   medroxyPROGESTERone  (PROVERA ) 5 MG tablet Take one tablet a day for 5 days every 1-2 months if no spontaneous menses. (Patient not taking: Reported on 04/30/2024) 15 tablet 3   No current facility-administered medications for this visit.    ALLERGIES: Patient has no known allergies.  Family History  Problem Relation Age of Onset   Hypertension Mother    Breast cancer Mother 59   Lung cancer Mother 43       died at 75; smoking hx   Alcohol abuse Mother    Cancer Mother    Hyperlipidemia Father    Arrhythmia Father        atrial fibrillation   Lung cancer Father 65   Heart disease Father    Hypertension Father    Stroke Father    Varicose Veins Father    Heart attack Father    Cancer Father    Basal cell carcinoma Paternal Aunt    Breast cancer Maternal Grandmother        dx 32s   Cancer Maternal Grandmother    Kidney disease Maternal Grandfather    Early death Maternal Grandfather 30   Dementia Paternal Grandmother 18   Varicose Veins Paternal Grandmother    Colon cancer Paternal Grandfather 20   Heart disease Paternal Grandfather    Skin cancer Paternal Grandfather 60   Alcohol abuse Paternal Grandfather    Cancer Paternal Grandfather    Sleep apnea Neg Hx     Review of Systems  All other systems reviewed and are negative.   PHYSICAL EXAM:  BP 122/80 (BP Location: Left Arm, Patient Position: Sitting)   Pulse 75   Ht 5' 2 (1.575 m)   Wt 152 lb (68.9 kg)   LMP 04/11/2024   SpO2 99%   BMI 27.80 kg/m     General appearance: alert, cooperative and appears stated age Head: normocephalic, without obvious abnormality, atraumatic Neck: no adenopathy, supple, symmetrical, trachea midline and thyroid normal to inspection and  palpation Lungs: clear to auscultation bilaterally Breasts: normal appearance, no masses or tenderness, No nipple retraction or dimpling, No nipple discharge or bleeding, No axillary adenopathy Heart: regular rate and rhythm Abdomen: soft, non-tender; no masses, no organomegaly Extremities: extremities normal, atraumatic, no cyanosis or edema Skin: skin color, texture, turgor normal. No rashes or lesions Lymph nodes: cervical, supraclavicular, and axillary nodes normal. Neurologic: grossly normal  Pelvic: External genitalia:  no lesions  No abnormal inguinal nodes palpated.              Urethra:  normal appearing urethra with no masses, tenderness or lesions              Bartholins and Skenes: normal                 Vagina: normal appearing vagina with normal color and discharge, no lesions              Cervix: no lesions.. Menstrual flow  noted.              Pap taken: no Bimanual Exam:  Uterus:  normal size, contour, position, consistency, mobility, non-tender              Adnexa: no mass, fullness, tenderness              Rectal exam: yes.  Confirms.              Anus:  normal sphincter tone, no lesions  Chaperone was present for exam:  Kari HERO, CMA  ASSESSMENT: Well woman visit with gynecologic exam. Hx LEEP, 2012.  Hx bladder cancer.  Treated with excision.  FH breast cancer.  Negative personal genetic testing.  TC lifetime risk of breast cancer 19%.  (It may be lower due to her negative genetic testing.) PHQ-2-9: 0 Successful weight loss.   PLAN: Mammogram screening discussed. Self breast awareness reviewed. Pap and HRV collected:  no.  Due next year.  Guidelines for Calcium , Vitamin D , regular exercise program including cardiovascular and weight bearing exercise. Medication refills:  NA Labs with PCP. Follow up:  yearly and prn.        "

## 2024-04-30 ENCOUNTER — Ambulatory Visit (INDEPENDENT_AMBULATORY_CARE_PROVIDER_SITE_OTHER): Admitting: Obstetrics and Gynecology

## 2024-04-30 ENCOUNTER — Encounter: Payer: Self-pay | Admitting: Obstetrics and Gynecology

## 2024-04-30 VITALS — BP 122/80 | HR 75 | Ht 62.0 in | Wt 152.0 lb

## 2024-04-30 DIAGNOSIS — Z1331 Encounter for screening for depression: Secondary | ICD-10-CM | POA: Diagnosis not present

## 2024-04-30 DIAGNOSIS — Z01419 Encounter for gynecological examination (general) (routine) without abnormal findings: Secondary | ICD-10-CM | POA: Diagnosis not present

## 2024-04-30 NOTE — Patient Instructions (Signed)

## 2024-05-08 ENCOUNTER — Other Ambulatory Visit: Payer: Self-pay | Admitting: Physician Assistant

## 2024-05-21 ENCOUNTER — Ambulatory Visit

## 2024-06-08 ENCOUNTER — Ambulatory Visit

## 2025-01-28 ENCOUNTER — Telehealth: Admitting: Adult Health

## 2025-05-04 ENCOUNTER — Ambulatory Visit: Admitting: Obstetrics and Gynecology
